# Patient Record
Sex: Female | Born: 1970 | Race: Black or African American | Hispanic: No | Smoking: Never smoker
Health system: Southern US, Community
[De-identification: ages and names within clinical notes are randomized; demographics above are authoritative.]

## PROBLEM LIST (undated history)

## (undated) DIAGNOSIS — M069 Rheumatoid arthritis, unspecified: Secondary | ICD-10-CM

## (undated) DIAGNOSIS — F329 Major depressive disorder, single episode, unspecified: Secondary | ICD-10-CM

## (undated) DIAGNOSIS — R42 Dizziness and giddiness: Secondary | ICD-10-CM

## (undated) DIAGNOSIS — E669 Obesity, unspecified: Secondary | ICD-10-CM

## (undated) DIAGNOSIS — F32A Depression, unspecified: Secondary | ICD-10-CM

## (undated) DIAGNOSIS — H8190 Unspecified disorder of vestibular function, unspecified ear: Secondary | ICD-10-CM

## (undated) DIAGNOSIS — E785 Hyperlipidemia, unspecified: Secondary | ICD-10-CM

## (undated) DIAGNOSIS — G629 Polyneuropathy, unspecified: Secondary | ICD-10-CM

## (undated) DIAGNOSIS — J310 Chronic rhinitis: Secondary | ICD-10-CM

## (undated) DIAGNOSIS — M797 Fibromyalgia: Secondary | ICD-10-CM

## (undated) DIAGNOSIS — Z8781 Personal history of (healed) traumatic fracture: Secondary | ICD-10-CM

## (undated) HISTORY — DX: Chronic rhinitis: J31.0

## (undated) HISTORY — DX: Major depressive disorder, single episode, unspecified: F32.9

## (undated) HISTORY — DX: Personal history of (healed) traumatic fracture: Z87.81

## (undated) HISTORY — DX: Unspecified disorder of vestibular function, unspecified ear: H81.90

## (undated) HISTORY — DX: Polyneuropathy, unspecified: G62.9

## (undated) HISTORY — DX: Dizziness and giddiness: R42

## (undated) HISTORY — DX: Fibromyalgia: M79.7

## (undated) HISTORY — DX: Depression, unspecified: F32.A

## (undated) HISTORY — DX: Obesity, unspecified: E66.9

## (undated) HISTORY — DX: Hyperlipidemia, unspecified: E78.5

## (undated) HISTORY — DX: Rheumatoid arthritis, unspecified: M06.9

---

## 1997-10-26 ENCOUNTER — Emergency Department (HOSPITAL_COMMUNITY): Admission: EM | Admit: 1997-10-26 | Discharge: 1997-10-26 | Payer: Self-pay | Admitting: Internal Medicine

## 1997-12-04 ENCOUNTER — Emergency Department (HOSPITAL_COMMUNITY): Admission: EM | Admit: 1997-12-04 | Discharge: 1997-12-04 | Payer: Self-pay | Admitting: Emergency Medicine

## 1998-01-23 ENCOUNTER — Emergency Department (HOSPITAL_COMMUNITY): Admission: EM | Admit: 1998-01-23 | Discharge: 1998-01-24 | Payer: Self-pay | Admitting: Emergency Medicine

## 1998-02-04 ENCOUNTER — Emergency Department (HOSPITAL_COMMUNITY): Admission: EM | Admit: 1998-02-04 | Discharge: 1998-02-04 | Payer: Self-pay | Admitting: Emergency Medicine

## 1998-03-01 ENCOUNTER — Emergency Department (HOSPITAL_COMMUNITY): Admission: EM | Admit: 1998-03-01 | Discharge: 1998-03-02 | Payer: Self-pay | Admitting: Emergency Medicine

## 1998-03-02 ENCOUNTER — Encounter: Payer: Self-pay | Admitting: Emergency Medicine

## 1998-03-06 ENCOUNTER — Inpatient Hospital Stay (HOSPITAL_COMMUNITY): Admission: AD | Admit: 1998-03-06 | Discharge: 1998-03-06 | Payer: Self-pay | Admitting: Obstetrics & Gynecology

## 1998-04-17 ENCOUNTER — Inpatient Hospital Stay (HOSPITAL_COMMUNITY): Admission: AD | Admit: 1998-04-17 | Discharge: 1998-04-17 | Payer: Self-pay | Admitting: *Deleted

## 1998-04-17 ENCOUNTER — Encounter: Payer: Self-pay | Admitting: *Deleted

## 1998-05-22 ENCOUNTER — Inpatient Hospital Stay (HOSPITAL_COMMUNITY): Admission: AD | Admit: 1998-05-22 | Discharge: 1998-05-22 | Payer: Self-pay | Admitting: Obstetrics & Gynecology

## 1998-07-06 ENCOUNTER — Inpatient Hospital Stay (HOSPITAL_COMMUNITY): Admission: AD | Admit: 1998-07-06 | Discharge: 1998-07-06 | Payer: Self-pay | Admitting: *Deleted

## 1998-07-28 ENCOUNTER — Ambulatory Visit (HOSPITAL_COMMUNITY): Admission: RE | Admit: 1998-07-28 | Discharge: 1998-07-28 | Payer: Self-pay | Admitting: Obstetrics

## 1998-08-17 ENCOUNTER — Inpatient Hospital Stay (HOSPITAL_COMMUNITY): Admission: RE | Admit: 1998-08-17 | Discharge: 1998-08-17 | Payer: Self-pay | Admitting: *Deleted

## 1998-09-16 ENCOUNTER — Encounter: Payer: Self-pay | Admitting: Obstetrics & Gynecology

## 1998-09-16 ENCOUNTER — Inpatient Hospital Stay (HOSPITAL_COMMUNITY): Admission: AD | Admit: 1998-09-16 | Discharge: 1998-09-16 | Payer: Self-pay | Admitting: Obstetrics & Gynecology

## 1998-10-11 ENCOUNTER — Inpatient Hospital Stay (HOSPITAL_COMMUNITY): Admission: AD | Admit: 1998-10-11 | Discharge: 1998-10-14 | Payer: Self-pay | Admitting: *Deleted

## 1998-12-19 ENCOUNTER — Inpatient Hospital Stay (HOSPITAL_COMMUNITY): Admission: AD | Admit: 1998-12-19 | Discharge: 1998-12-19 | Payer: Self-pay | Admitting: Obstetrics & Gynecology

## 1998-12-24 ENCOUNTER — Other Ambulatory Visit: Admission: RE | Admit: 1998-12-24 | Discharge: 1998-12-24 | Payer: Self-pay | Admitting: *Deleted

## 1998-12-24 ENCOUNTER — Encounter: Admission: RE | Admit: 1998-12-24 | Discharge: 1998-12-24 | Payer: Self-pay | Admitting: Obstetrics & Gynecology

## 1999-01-24 ENCOUNTER — Ambulatory Visit (HOSPITAL_COMMUNITY): Admission: RE | Admit: 1999-01-24 | Discharge: 1999-01-24 | Payer: Self-pay | Admitting: Obstetrics & Gynecology

## 1999-05-06 ENCOUNTER — Emergency Department (HOSPITAL_COMMUNITY): Admission: EM | Admit: 1999-05-06 | Discharge: 1999-05-06 | Payer: Self-pay

## 1999-08-02 ENCOUNTER — Emergency Department (HOSPITAL_COMMUNITY): Admission: EM | Admit: 1999-08-02 | Discharge: 1999-08-03 | Payer: Self-pay | Admitting: Emergency Medicine

## 1999-09-13 ENCOUNTER — Emergency Department (HOSPITAL_COMMUNITY): Admission: EM | Admit: 1999-09-13 | Discharge: 1999-09-13 | Payer: Self-pay | Admitting: Emergency Medicine

## 1999-09-19 ENCOUNTER — Encounter: Admission: RE | Admit: 1999-09-19 | Discharge: 1999-09-19 | Payer: Self-pay | Admitting: Internal Medicine

## 1999-09-23 ENCOUNTER — Emergency Department (HOSPITAL_COMMUNITY): Admission: EM | Admit: 1999-09-23 | Discharge: 1999-09-23 | Payer: Self-pay | Admitting: Emergency Medicine

## 1999-12-13 ENCOUNTER — Encounter: Admission: RE | Admit: 1999-12-13 | Discharge: 1999-12-13 | Payer: Self-pay | Admitting: Hematology and Oncology

## 2000-02-16 ENCOUNTER — Emergency Department (HOSPITAL_COMMUNITY): Admission: EM | Admit: 2000-02-16 | Discharge: 2000-02-16 | Payer: Self-pay | Admitting: Internal Medicine

## 2000-02-17 ENCOUNTER — Encounter: Payer: Self-pay | Admitting: Internal Medicine

## 2000-04-27 ENCOUNTER — Encounter: Admission: RE | Admit: 2000-04-27 | Discharge: 2000-04-27 | Payer: Self-pay | Admitting: Internal Medicine

## 2000-05-11 ENCOUNTER — Encounter: Admission: RE | Admit: 2000-05-11 | Discharge: 2000-05-11 | Payer: Self-pay | Admitting: Internal Medicine

## 2000-05-23 ENCOUNTER — Encounter: Admission: RE | Admit: 2000-05-23 | Discharge: 2000-05-23 | Payer: Self-pay | Admitting: Internal Medicine

## 2000-06-23 ENCOUNTER — Encounter: Payer: Self-pay | Admitting: Emergency Medicine

## 2000-06-23 ENCOUNTER — Emergency Department (HOSPITAL_COMMUNITY): Admission: EM | Admit: 2000-06-23 | Discharge: 2000-06-23 | Payer: Self-pay | Admitting: Emergency Medicine

## 2000-07-18 ENCOUNTER — Encounter: Payer: Self-pay | Admitting: Hematology and Oncology

## 2000-07-18 ENCOUNTER — Ambulatory Visit (HOSPITAL_COMMUNITY): Admission: RE | Admit: 2000-07-18 | Discharge: 2000-07-18 | Payer: Self-pay | Admitting: Hematology and Oncology

## 2000-07-18 ENCOUNTER — Encounter: Admission: RE | Admit: 2000-07-18 | Discharge: 2000-07-18 | Payer: Self-pay | Admitting: Hematology and Oncology

## 2000-09-18 ENCOUNTER — Encounter: Admission: RE | Admit: 2000-09-18 | Discharge: 2000-09-18 | Payer: Self-pay | Admitting: Internal Medicine

## 2000-11-16 ENCOUNTER — Other Ambulatory Visit: Admission: RE | Admit: 2000-11-16 | Discharge: 2000-11-16 | Payer: Self-pay | Admitting: Internal Medicine

## 2000-11-16 ENCOUNTER — Encounter: Admission: RE | Admit: 2000-11-16 | Discharge: 2000-11-16 | Payer: Self-pay | Admitting: Internal Medicine

## 2000-11-21 ENCOUNTER — Encounter: Payer: Self-pay | Admitting: Internal Medicine

## 2000-11-21 ENCOUNTER — Ambulatory Visit (HOSPITAL_COMMUNITY): Admission: RE | Admit: 2000-11-21 | Discharge: 2000-11-21 | Payer: Self-pay | Admitting: Internal Medicine

## 2001-01-17 ENCOUNTER — Encounter: Admission: RE | Admit: 2001-01-17 | Discharge: 2001-01-17 | Payer: Self-pay | Admitting: Internal Medicine

## 2001-01-22 ENCOUNTER — Encounter: Admission: RE | Admit: 2001-01-22 | Discharge: 2001-01-22 | Payer: Self-pay

## 2001-01-30 ENCOUNTER — Encounter: Admission: RE | Admit: 2001-01-30 | Discharge: 2001-01-30 | Payer: Self-pay | Admitting: Internal Medicine

## 2001-02-06 ENCOUNTER — Encounter: Admission: RE | Admit: 2001-02-06 | Discharge: 2001-02-06 | Payer: Self-pay | Admitting: Internal Medicine

## 2001-02-14 ENCOUNTER — Encounter: Admission: RE | Admit: 2001-02-14 | Discharge: 2001-02-14 | Payer: Self-pay | Admitting: Internal Medicine

## 2001-03-01 ENCOUNTER — Encounter: Admission: RE | Admit: 2001-03-01 | Discharge: 2001-03-01 | Payer: Self-pay

## 2001-03-05 ENCOUNTER — Encounter: Admission: RE | Admit: 2001-03-05 | Discharge: 2001-03-05 | Payer: Self-pay

## 2001-03-15 ENCOUNTER — Ambulatory Visit (HOSPITAL_COMMUNITY): Admission: RE | Admit: 2001-03-15 | Discharge: 2001-03-15 | Payer: Self-pay

## 2001-03-15 ENCOUNTER — Encounter: Admission: RE | Admit: 2001-03-15 | Discharge: 2001-03-15 | Payer: Self-pay | Admitting: *Deleted

## 2001-03-22 ENCOUNTER — Emergency Department (HOSPITAL_COMMUNITY): Admission: EM | Admit: 2001-03-22 | Discharge: 2001-03-22 | Payer: Self-pay | Admitting: Emergency Medicine

## 2001-03-22 ENCOUNTER — Encounter: Admission: RE | Admit: 2001-03-22 | Discharge: 2001-03-22 | Payer: Self-pay | Admitting: Internal Medicine

## 2001-03-22 ENCOUNTER — Encounter: Payer: Self-pay | Admitting: Emergency Medicine

## 2001-05-07 ENCOUNTER — Encounter: Admission: RE | Admit: 2001-05-07 | Discharge: 2001-05-07 | Payer: Self-pay | Admitting: Internal Medicine

## 2001-07-18 ENCOUNTER — Encounter: Admission: RE | Admit: 2001-07-18 | Discharge: 2001-07-18 | Payer: Self-pay | Admitting: Internal Medicine

## 2001-07-18 ENCOUNTER — Ambulatory Visit (HOSPITAL_COMMUNITY): Admission: RE | Admit: 2001-07-18 | Discharge: 2001-07-18 | Payer: Self-pay | Admitting: Internal Medicine

## 2001-07-18 ENCOUNTER — Encounter: Payer: Self-pay | Admitting: Internal Medicine

## 2001-08-29 ENCOUNTER — Encounter: Admission: RE | Admit: 2001-08-29 | Discharge: 2001-08-29 | Payer: Self-pay | Admitting: Internal Medicine

## 2001-09-25 ENCOUNTER — Ambulatory Visit (HOSPITAL_COMMUNITY): Admission: RE | Admit: 2001-09-25 | Discharge: 2001-09-25 | Payer: Self-pay | Admitting: Internal Medicine

## 2001-09-25 ENCOUNTER — Encounter: Payer: Self-pay | Admitting: Internal Medicine

## 2001-09-25 ENCOUNTER — Encounter: Admission: RE | Admit: 2001-09-25 | Discharge: 2001-09-25 | Payer: Self-pay | Admitting: Internal Medicine

## 2001-09-30 ENCOUNTER — Encounter: Admission: RE | Admit: 2001-09-30 | Discharge: 2001-09-30 | Payer: Self-pay | Admitting: Internal Medicine

## 2001-11-01 ENCOUNTER — Emergency Department (HOSPITAL_COMMUNITY): Admission: EM | Admit: 2001-11-01 | Discharge: 2001-11-02 | Payer: Self-pay | Admitting: Emergency Medicine

## 2001-11-09 ENCOUNTER — Emergency Department (HOSPITAL_COMMUNITY): Admission: EM | Admit: 2001-11-09 | Discharge: 2001-11-09 | Payer: Self-pay | Admitting: Emergency Medicine

## 2001-12-25 ENCOUNTER — Encounter: Admission: RE | Admit: 2001-12-25 | Discharge: 2001-12-25 | Payer: Self-pay | Admitting: Internal Medicine

## 2002-01-02 ENCOUNTER — Emergency Department (HOSPITAL_COMMUNITY): Admission: EM | Admit: 2002-01-02 | Discharge: 2002-01-02 | Payer: Self-pay | Admitting: Emergency Medicine

## 2002-04-08 ENCOUNTER — Encounter: Admission: RE | Admit: 2002-04-08 | Discharge: 2002-04-08 | Payer: Self-pay | Admitting: Internal Medicine

## 2002-04-11 ENCOUNTER — Ambulatory Visit (HOSPITAL_COMMUNITY): Admission: RE | Admit: 2002-04-11 | Discharge: 2002-04-11 | Payer: Self-pay | Admitting: Internal Medicine

## 2002-04-11 ENCOUNTER — Encounter: Payer: Self-pay | Admitting: Internal Medicine

## 2002-05-29 ENCOUNTER — Encounter: Admission: RE | Admit: 2002-05-29 | Discharge: 2002-05-29 | Payer: Self-pay | Admitting: Internal Medicine

## 2002-08-11 ENCOUNTER — Encounter: Admission: RE | Admit: 2002-08-11 | Discharge: 2002-08-11 | Payer: Self-pay | Admitting: Internal Medicine

## 2002-10-08 ENCOUNTER — Emergency Department (HOSPITAL_COMMUNITY): Admission: EM | Admit: 2002-10-08 | Discharge: 2002-10-08 | Payer: Self-pay | Admitting: Emergency Medicine

## 2002-11-06 ENCOUNTER — Encounter: Admission: RE | Admit: 2002-11-06 | Discharge: 2002-11-06 | Payer: Self-pay | Admitting: Internal Medicine

## 2002-11-07 ENCOUNTER — Encounter: Payer: Self-pay | Admitting: Internal Medicine

## 2002-11-07 ENCOUNTER — Encounter: Admission: RE | Admit: 2002-11-07 | Discharge: 2002-11-07 | Payer: Self-pay | Admitting: Internal Medicine

## 2002-11-12 ENCOUNTER — Encounter: Admission: RE | Admit: 2002-11-12 | Discharge: 2002-11-12 | Payer: Self-pay | Admitting: Internal Medicine

## 2002-11-19 ENCOUNTER — Encounter: Admission: RE | Admit: 2002-11-19 | Discharge: 2002-11-19 | Payer: Self-pay | Admitting: Internal Medicine

## 2002-11-26 ENCOUNTER — Encounter: Admission: RE | Admit: 2002-11-26 | Discharge: 2002-11-26 | Payer: Self-pay | Admitting: Internal Medicine

## 2002-12-08 ENCOUNTER — Ambulatory Visit (HOSPITAL_COMMUNITY): Admission: RE | Admit: 2002-12-08 | Discharge: 2002-12-08 | Payer: Self-pay | Admitting: Internal Medicine

## 2002-12-22 ENCOUNTER — Encounter: Admission: RE | Admit: 2002-12-22 | Discharge: 2002-12-22 | Payer: Self-pay | Admitting: Internal Medicine

## 2003-01-31 ENCOUNTER — Emergency Department (HOSPITAL_COMMUNITY): Admission: EM | Admit: 2003-01-31 | Discharge: 2003-01-31 | Payer: Self-pay | Admitting: *Deleted

## 2003-02-02 ENCOUNTER — Emergency Department (HOSPITAL_COMMUNITY): Admission: AD | Admit: 2003-02-02 | Discharge: 2003-02-02 | Payer: Self-pay | Admitting: Family Medicine

## 2003-02-09 ENCOUNTER — Encounter (INDEPENDENT_AMBULATORY_CARE_PROVIDER_SITE_OTHER): Payer: Self-pay | Admitting: Internal Medicine

## 2003-02-09 ENCOUNTER — Other Ambulatory Visit: Admission: RE | Admit: 2003-02-09 | Discharge: 2003-02-09 | Payer: Self-pay | Admitting: Internal Medicine

## 2003-02-09 ENCOUNTER — Encounter: Admission: RE | Admit: 2003-02-09 | Discharge: 2003-02-09 | Payer: Self-pay | Admitting: Internal Medicine

## 2003-03-23 ENCOUNTER — Encounter: Payer: Self-pay | Admitting: Internal Medicine

## 2003-03-23 ENCOUNTER — Encounter: Admission: RE | Admit: 2003-03-23 | Discharge: 2003-03-23 | Payer: Self-pay | Admitting: Internal Medicine

## 2003-04-03 ENCOUNTER — Emergency Department (HOSPITAL_COMMUNITY): Admission: AD | Admit: 2003-04-03 | Discharge: 2003-04-03 | Payer: Self-pay | Admitting: Family Medicine

## 2003-05-12 ENCOUNTER — Encounter: Admission: RE | Admit: 2003-05-12 | Discharge: 2003-05-12 | Payer: Self-pay | Admitting: Internal Medicine

## 2003-05-19 ENCOUNTER — Encounter: Admission: RE | Admit: 2003-05-19 | Discharge: 2003-05-19 | Payer: Self-pay | Admitting: Internal Medicine

## 2003-06-17 ENCOUNTER — Emergency Department (HOSPITAL_COMMUNITY): Admission: EM | Admit: 2003-06-17 | Discharge: 2003-06-17 | Payer: Self-pay | Admitting: Family Medicine

## 2003-07-17 ENCOUNTER — Encounter: Admission: RE | Admit: 2003-07-17 | Discharge: 2003-07-17 | Payer: Self-pay | Admitting: Internal Medicine

## 2003-07-20 ENCOUNTER — Emergency Department (HOSPITAL_COMMUNITY): Admission: EM | Admit: 2003-07-20 | Discharge: 2003-07-20 | Payer: Self-pay | Admitting: Emergency Medicine

## 2003-08-21 ENCOUNTER — Encounter: Admission: RE | Admit: 2003-08-21 | Discharge: 2003-08-21 | Payer: Self-pay | Admitting: Internal Medicine

## 2003-10-01 ENCOUNTER — Emergency Department (HOSPITAL_COMMUNITY): Admission: EM | Admit: 2003-10-01 | Discharge: 2003-10-01 | Payer: Self-pay | Admitting: Family Medicine

## 2003-11-28 ENCOUNTER — Emergency Department (HOSPITAL_COMMUNITY): Admission: EM | Admit: 2003-11-28 | Discharge: 2003-11-28 | Payer: Self-pay | Admitting: Emergency Medicine

## 2003-12-25 ENCOUNTER — Ambulatory Visit: Payer: Self-pay | Admitting: Internal Medicine

## 2004-02-16 ENCOUNTER — Emergency Department (HOSPITAL_COMMUNITY): Admission: EM | Admit: 2004-02-16 | Discharge: 2004-02-16 | Payer: Self-pay | Admitting: Family Medicine

## 2004-02-17 ENCOUNTER — Emergency Department (HOSPITAL_COMMUNITY): Admission: EM | Admit: 2004-02-17 | Discharge: 2004-02-17 | Payer: Self-pay | Admitting: Family Medicine

## 2004-03-11 ENCOUNTER — Ambulatory Visit: Payer: Self-pay | Admitting: Internal Medicine

## 2004-03-11 ENCOUNTER — Other Ambulatory Visit: Admission: RE | Admit: 2004-03-11 | Discharge: 2004-03-11 | Payer: Self-pay | Admitting: *Deleted

## 2004-05-28 ENCOUNTER — Emergency Department (HOSPITAL_COMMUNITY): Admission: EM | Admit: 2004-05-28 | Discharge: 2004-05-28 | Payer: Self-pay | Admitting: Emergency Medicine

## 2004-07-08 ENCOUNTER — Ambulatory Visit: Payer: Self-pay | Admitting: Internal Medicine

## 2004-08-08 ENCOUNTER — Ambulatory Visit: Payer: Self-pay | Admitting: Internal Medicine

## 2004-09-02 ENCOUNTER — Emergency Department (HOSPITAL_COMMUNITY): Admission: EM | Admit: 2004-09-02 | Discharge: 2004-09-02 | Payer: Self-pay | Admitting: Emergency Medicine

## 2004-10-04 ENCOUNTER — Emergency Department (HOSPITAL_COMMUNITY): Admission: EM | Admit: 2004-10-04 | Discharge: 2004-10-04 | Payer: Self-pay | Admitting: Family Medicine

## 2004-11-17 ENCOUNTER — Ambulatory Visit: Payer: Self-pay | Admitting: Internal Medicine

## 2004-12-29 ENCOUNTER — Emergency Department (HOSPITAL_COMMUNITY): Admission: EM | Admit: 2004-12-29 | Discharge: 2004-12-29 | Payer: Self-pay | Admitting: Emergency Medicine

## 2005-01-11 ENCOUNTER — Emergency Department (HOSPITAL_COMMUNITY): Admission: EM | Admit: 2005-01-11 | Discharge: 2005-01-11 | Payer: Self-pay | Admitting: Family Medicine

## 2005-01-30 ENCOUNTER — Emergency Department (HOSPITAL_COMMUNITY): Admission: EM | Admit: 2005-01-30 | Discharge: 2005-01-30 | Payer: Self-pay | Admitting: Family Medicine

## 2005-03-04 ENCOUNTER — Emergency Department (HOSPITAL_COMMUNITY): Admission: EM | Admit: 2005-03-04 | Discharge: 2005-03-04 | Payer: Self-pay | Admitting: Emergency Medicine

## 2005-03-17 ENCOUNTER — Ambulatory Visit (HOSPITAL_COMMUNITY): Admission: RE | Admit: 2005-03-17 | Discharge: 2005-03-17 | Payer: Self-pay | Admitting: Hospitalist

## 2005-03-17 ENCOUNTER — Ambulatory Visit: Payer: Self-pay | Admitting: Hospitalist

## 2005-05-04 ENCOUNTER — Emergency Department (HOSPITAL_COMMUNITY): Admission: EM | Admit: 2005-05-04 | Discharge: 2005-05-04 | Payer: Self-pay | Admitting: Family Medicine

## 2005-06-14 ENCOUNTER — Ambulatory Visit: Payer: Self-pay | Admitting: Internal Medicine

## 2005-06-17 ENCOUNTER — Ambulatory Visit (HOSPITAL_COMMUNITY): Admission: RE | Admit: 2005-06-17 | Discharge: 2005-06-17 | Payer: Self-pay | Admitting: *Deleted

## 2005-06-29 ENCOUNTER — Ambulatory Visit: Payer: Self-pay | Admitting: Internal Medicine

## 2005-07-12 ENCOUNTER — Encounter: Admission: RE | Admit: 2005-07-12 | Discharge: 2005-08-08 | Payer: Self-pay | Admitting: *Deleted

## 2005-08-16 ENCOUNTER — Ambulatory Visit: Payer: Self-pay | Admitting: Internal Medicine

## 2005-08-22 ENCOUNTER — Ambulatory Visit (HOSPITAL_COMMUNITY): Admission: RE | Admit: 2005-08-22 | Discharge: 2005-08-22 | Payer: Self-pay | Admitting: Internal Medicine

## 2005-09-04 ENCOUNTER — Emergency Department (HOSPITAL_COMMUNITY): Admission: EM | Admit: 2005-09-04 | Discharge: 2005-09-04 | Payer: Self-pay | Admitting: Family Medicine

## 2005-10-04 ENCOUNTER — Emergency Department (HOSPITAL_COMMUNITY): Admission: EM | Admit: 2005-10-04 | Discharge: 2005-10-04 | Payer: Self-pay | Admitting: Family Medicine

## 2005-10-24 ENCOUNTER — Emergency Department (HOSPITAL_COMMUNITY): Admission: EM | Admit: 2005-10-24 | Discharge: 2005-10-24 | Payer: Self-pay | Admitting: Emergency Medicine

## 2005-10-30 ENCOUNTER — Ambulatory Visit: Payer: Self-pay | Admitting: Hospitalist

## 2005-11-07 ENCOUNTER — Ambulatory Visit: Payer: Self-pay | Admitting: Hospitalist

## 2005-11-13 ENCOUNTER — Ambulatory Visit: Payer: Self-pay | Admitting: Internal Medicine

## 2005-11-14 ENCOUNTER — Ambulatory Visit (HOSPITAL_COMMUNITY): Admission: RE | Admit: 2005-11-14 | Discharge: 2005-11-14 | Payer: Self-pay | Admitting: *Deleted

## 2005-11-24 ENCOUNTER — Emergency Department (HOSPITAL_COMMUNITY): Admission: EM | Admit: 2005-11-24 | Discharge: 2005-11-24 | Payer: Self-pay | Admitting: Emergency Medicine

## 2005-12-01 ENCOUNTER — Ambulatory Visit (HOSPITAL_COMMUNITY): Admission: RE | Admit: 2005-12-01 | Discharge: 2005-12-01 | Payer: Self-pay | Admitting: *Deleted

## 2006-01-08 DIAGNOSIS — J45909 Unspecified asthma, uncomplicated: Secondary | ICD-10-CM | POA: Insufficient documentation

## 2006-01-08 DIAGNOSIS — H9319 Tinnitus, unspecified ear: Secondary | ICD-10-CM | POA: Insufficient documentation

## 2006-01-22 ENCOUNTER — Encounter (INDEPENDENT_AMBULATORY_CARE_PROVIDER_SITE_OTHER): Payer: Self-pay | Admitting: *Deleted

## 2006-01-22 ENCOUNTER — Ambulatory Visit: Payer: Self-pay | Admitting: Hospitalist

## 2006-01-22 LAB — CONVERTED CEMR LAB
AST: 22 units/L (ref 0–37)
BUN: 9 mg/dL (ref 6–23)
Bilirubin Urine: NEGATIVE
Calcium: 9.7 mg/dL (ref 8.4–10.5)
Chloride: 103 meq/L (ref 96–112)
Creatinine, Ser: 0.86 mg/dL (ref 0.40–1.20)
Nitrite: NEGATIVE
RBC / HPF: NONE SEEN (ref <3)
Specific Gravity, Urine: 1.025 (ref 1.005–1.03)
Total Bilirubin: 0.5 mg/dL (ref 0.3–1.2)
Urobilinogen, UA: 0.2 (ref 0.0–1.0)
pH: 6 (ref 5.0–8.0)

## 2006-02-19 ENCOUNTER — Ambulatory Visit: Payer: Self-pay | Admitting: *Deleted

## 2006-02-19 ENCOUNTER — Encounter (INDEPENDENT_AMBULATORY_CARE_PROVIDER_SITE_OTHER): Payer: Self-pay | Admitting: *Deleted

## 2006-02-19 LAB — CONVERTED CEMR LAB
Alkaline Phosphatase: 89 units/L (ref 39–117)
BUN: 12 mg/dL (ref 6–23)
Bilirubin Urine: NEGATIVE
Glucose, Bld: 124 mg/dL — ABNORMAL HIGH (ref 70–99)
RBC / HPF: NONE SEEN (ref <3)
Sodium: 136 meq/L (ref 135–145)
Specific Gravity, Urine: 1.026 (ref 1.005–1.03)
Total Bilirubin: 0.3 mg/dL (ref 0.3–1.2)
Urine Glucose: 250 mg/dL — AB
WBC, UA: NONE SEEN cells/hpf (ref <3)

## 2006-04-02 ENCOUNTER — Telehealth (INDEPENDENT_AMBULATORY_CARE_PROVIDER_SITE_OTHER): Payer: Self-pay | Admitting: *Deleted

## 2006-04-10 ENCOUNTER — Encounter (INDEPENDENT_AMBULATORY_CARE_PROVIDER_SITE_OTHER): Payer: Self-pay | Admitting: *Deleted

## 2006-04-10 ENCOUNTER — Ambulatory Visit: Payer: Self-pay | Admitting: Internal Medicine

## 2006-04-10 DIAGNOSIS — F3289 Other specified depressive episodes: Secondary | ICD-10-CM | POA: Insufficient documentation

## 2006-04-10 DIAGNOSIS — F329 Major depressive disorder, single episode, unspecified: Secondary | ICD-10-CM | POA: Insufficient documentation

## 2006-04-10 LAB — CONVERTED CEMR LAB: TSH: 2.277 microintl units/mL (ref 0.350–5.50)

## 2006-04-12 ENCOUNTER — Ambulatory Visit (HOSPITAL_COMMUNITY): Admission: RE | Admit: 2006-04-12 | Discharge: 2006-04-12 | Payer: Self-pay | Admitting: *Deleted

## 2006-04-16 ENCOUNTER — Telehealth: Payer: Self-pay | Admitting: *Deleted

## 2006-04-17 ENCOUNTER — Encounter (INDEPENDENT_AMBULATORY_CARE_PROVIDER_SITE_OTHER): Payer: Self-pay | Admitting: *Deleted

## 2006-04-17 ENCOUNTER — Telehealth: Payer: Self-pay | Admitting: *Deleted

## 2006-05-07 ENCOUNTER — Encounter (INDEPENDENT_AMBULATORY_CARE_PROVIDER_SITE_OTHER): Payer: Self-pay | Admitting: *Deleted

## 2006-06-05 ENCOUNTER — Emergency Department (HOSPITAL_COMMUNITY): Admission: EM | Admit: 2006-06-05 | Discharge: 2006-06-05 | Payer: Self-pay | Admitting: Emergency Medicine

## 2006-07-17 ENCOUNTER — Ambulatory Visit: Payer: Self-pay | Admitting: Internal Medicine

## 2006-07-17 ENCOUNTER — Ambulatory Visit (HOSPITAL_COMMUNITY): Admission: RE | Admit: 2006-07-17 | Discharge: 2006-07-17 | Payer: Self-pay | Admitting: Internal Medicine

## 2006-08-03 ENCOUNTER — Telehealth: Payer: Self-pay | Admitting: *Deleted

## 2006-08-03 ENCOUNTER — Emergency Department (HOSPITAL_COMMUNITY): Admission: EM | Admit: 2006-08-03 | Discharge: 2006-08-03 | Payer: Self-pay | Admitting: Family Medicine

## 2006-08-23 ENCOUNTER — Ambulatory Visit: Payer: Self-pay | Admitting: Internal Medicine

## 2006-08-23 DIAGNOSIS — M25569 Pain in unspecified knee: Secondary | ICD-10-CM | POA: Insufficient documentation

## 2006-08-28 ENCOUNTER — Ambulatory Visit: Payer: Self-pay | Admitting: Internal Medicine

## 2006-08-28 ENCOUNTER — Encounter (INDEPENDENT_AMBULATORY_CARE_PROVIDER_SITE_OTHER): Payer: Self-pay | Admitting: Internal Medicine

## 2006-08-28 ENCOUNTER — Telehealth: Payer: Self-pay | Admitting: *Deleted

## 2006-08-28 LAB — CONVERTED CEMR LAB
Nitrite: NEGATIVE
Specific Gravity, Urine: >=1.03
pH: 6

## 2006-08-29 LAB — CONVERTED CEMR LAB
Bilirubin Urine: NEGATIVE
Hemoglobin, Urine: NEGATIVE
Protein, ur: 100 mg/dL — AB
Urine Glucose: NEGATIVE mg/dL

## 2006-09-17 ENCOUNTER — Encounter (INDEPENDENT_AMBULATORY_CARE_PROVIDER_SITE_OTHER): Payer: Self-pay | Admitting: Internal Medicine

## 2006-09-17 ENCOUNTER — Ambulatory Visit: Payer: Self-pay | Admitting: Hospitalist

## 2006-09-17 LAB — CONVERTED CEMR LAB
ALT: 25 units/L (ref 0–35)
AST: 20 units/L (ref 0–37)
Albumin: 4.2 g/dL (ref 3.5–5.2)
Alkaline Phosphatase: 97 units/L (ref 39–117)
Basophils Absolute: 0 10*3/uL (ref 0.0–0.1)
Basophils Relative: 1 % (ref 0–1)
Bilirubin Urine: NEGATIVE
Eosinophils Absolute: 0.4 10*3/uL (ref 0.0–0.7)
Ketones, ur: NEGATIVE mg/dL
Lymphs Abs: 2 10*3/uL (ref 0.7–3.3)
MCHC: 32 g/dL (ref 30.0–36.0)
MCV: 72.6 fL — ABNORMAL LOW (ref 78.0–100.0)
Microalb, Ur: 50.3 mg/dL — ABNORMAL HIGH (ref 0.00–1.89)
Neutrophils Relative %: 46 % (ref 43–77)
Platelets: 266 10*3/uL (ref 150–400)
Potassium: 4 meq/L (ref 3.5–5.3)
Protein, ur: 100 mg/dL — AB
RDW: 15.8 % — ABNORMAL HIGH (ref 11.5–14.0)
Sodium: 140 meq/L (ref 135–145)
Total Protein: 7.7 g/dL (ref 6.0–8.3)
Urine Glucose: NEGATIVE mg/dL
Urobilinogen, UA: 1 (ref 0.0–1.0)
WBC: 5.3 10*3/uL (ref 4.0–10.5)

## 2006-09-19 ENCOUNTER — Encounter (INDEPENDENT_AMBULATORY_CARE_PROVIDER_SITE_OTHER): Payer: Self-pay | Admitting: Internal Medicine

## 2006-10-31 ENCOUNTER — Inpatient Hospital Stay (HOSPITAL_COMMUNITY): Admission: AD | Admit: 2006-10-31 | Discharge: 2006-10-31 | Payer: Self-pay | Admitting: Gynecology

## 2006-10-31 ENCOUNTER — Encounter: Admission: RE | Admit: 2006-10-31 | Discharge: 2007-01-29 | Payer: Self-pay | Admitting: Orthopedic Surgery

## 2006-11-06 ENCOUNTER — Encounter: Admission: RE | Admit: 2006-11-06 | Discharge: 2006-11-06 | Payer: Self-pay | Admitting: Internal Medicine

## 2006-12-06 ENCOUNTER — Emergency Department (HOSPITAL_COMMUNITY): Admission: EM | Admit: 2006-12-06 | Discharge: 2006-12-06 | Payer: Self-pay | Admitting: Emergency Medicine

## 2007-01-07 ENCOUNTER — Encounter (INDEPENDENT_AMBULATORY_CARE_PROVIDER_SITE_OTHER): Payer: Self-pay | Admitting: *Deleted

## 2007-01-07 ENCOUNTER — Ambulatory Visit: Payer: Self-pay | Admitting: Hospitalist

## 2007-01-07 LAB — CONVERTED CEMR LAB
ALT: 19 units/L (ref 0–35)
AST: 17 units/L (ref 0–37)
Bilirubin Urine: NEGATIVE
CO2: 25 meq/L (ref 19–32)
Chloride: 103 meq/L (ref 96–112)
Creatinine, Ser: 0.83 mg/dL (ref 0.40–1.20)
Ketones, urine, test strip: NEGATIVE
Nitrite: NEGATIVE
Protein, U semiquant: 100
Sodium: 139 meq/L (ref 135–145)
Total Bilirubin: 0.4 mg/dL (ref 0.3–1.2)
Total Protein: 7.5 g/dL (ref 6.0–8.3)
WBC Urine, dipstick: NEGATIVE

## 2007-01-08 ENCOUNTER — Ambulatory Visit: Payer: Self-pay | Admitting: *Deleted

## 2007-01-08 LAB — CONVERTED CEMR LAB: Blood Glucose, AC Bkfst: 118 mg/dL

## 2007-01-14 ENCOUNTER — Ambulatory Visit: Payer: Self-pay | Admitting: Hospitalist

## 2007-01-14 LAB — CONVERTED CEMR LAB
Bilirubin Urine: NEGATIVE
Blood in Urine, dipstick: NEGATIVE
Protein, U semiquant: 100
Specific Gravity, Urine: >=1.03

## 2007-01-15 ENCOUNTER — Telehealth: Payer: Self-pay | Admitting: Licensed Clinical Social Worker

## 2007-01-21 ENCOUNTER — Telehealth: Payer: Self-pay | Admitting: *Deleted

## 2007-03-22 ENCOUNTER — Emergency Department (HOSPITAL_COMMUNITY): Admission: EM | Admit: 2007-03-22 | Discharge: 2007-03-22 | Payer: Self-pay | Admitting: Family Medicine

## 2007-04-03 ENCOUNTER — Ambulatory Visit: Payer: Self-pay | Admitting: Infectious Disease

## 2007-04-03 ENCOUNTER — Encounter (INDEPENDENT_AMBULATORY_CARE_PROVIDER_SITE_OTHER): Payer: Self-pay | Admitting: Internal Medicine

## 2007-04-08 ENCOUNTER — Ambulatory Visit (HOSPITAL_COMMUNITY): Admission: RE | Admit: 2007-04-08 | Discharge: 2007-04-08 | Payer: Self-pay | Admitting: Internal Medicine

## 2007-04-08 LAB — CONVERTED CEMR LAB
Anti Nuclear Antibody(ANA): NEGATIVE
Basophils Absolute: 0 10*3/uL (ref 0.0–0.1)
Basophils Relative: 0 % (ref 0–1)
Bilirubin Urine: NEGATIVE
Eosinophils Absolute: 0.3 10*3/uL (ref 0.0–0.7)
Eosinophils Relative: 6 % — ABNORMAL HIGH (ref 0–5)
HCT: 42.5 % (ref 36.0–46.0)
Hemoglobin, Urine: NEGATIVE
Hemoglobin: 13.1 g/dL (ref 12.0–15.0)
Ketones, ur: NEGATIVE mg/dL
MCHC: 30.8 g/dL (ref 30.0–36.0)
MCV: 73.3 fL — ABNORMAL LOW (ref 78.0–100.0)
Monocytes Absolute: 0.5 10*3/uL (ref 0.1–1.0)
Nitrite: NEGATIVE
Platelets: 203 10*3/uL (ref 150–400)
RDW: 15.7 % — ABNORMAL HIGH (ref 11.5–15.5)
Specific Gravity, Urine: 1.019 (ref 1.005–1.03)
Urobilinogen, UA: 1 (ref 0.0–1.0)
WBC, UA: NONE SEEN cells/hpf (ref <3)

## 2007-04-10 ENCOUNTER — Encounter: Admission: RE | Admit: 2007-04-10 | Discharge: 2007-05-23 | Payer: Self-pay | Admitting: Orthopedic Surgery

## 2007-04-12 ENCOUNTER — Telehealth (INDEPENDENT_AMBULATORY_CARE_PROVIDER_SITE_OTHER): Payer: Self-pay | Admitting: Internal Medicine

## 2007-04-17 ENCOUNTER — Ambulatory Visit: Payer: Self-pay | Admitting: Internal Medicine

## 2007-07-17 ENCOUNTER — Ambulatory Visit: Payer: Self-pay | Admitting: Gynecology

## 2007-07-19 ENCOUNTER — Ambulatory Visit (HOSPITAL_COMMUNITY): Admission: RE | Admit: 2007-07-19 | Discharge: 2007-07-19 | Payer: Self-pay | Admitting: Family Medicine

## 2007-07-23 ENCOUNTER — Emergency Department (HOSPITAL_COMMUNITY): Admission: EM | Admit: 2007-07-23 | Discharge: 2007-07-23 | Payer: Self-pay | Admitting: Emergency Medicine

## 2007-09-16 ENCOUNTER — Telehealth: Payer: Self-pay | Admitting: Internal Medicine

## 2007-09-17 ENCOUNTER — Encounter: Payer: Self-pay | Admitting: Internal Medicine

## 2007-09-24 ENCOUNTER — Encounter: Payer: Self-pay | Admitting: Internal Medicine

## 2007-10-03 ENCOUNTER — Encounter: Payer: Self-pay | Admitting: Internal Medicine

## 2007-10-03 ENCOUNTER — Ambulatory Visit: Payer: Self-pay | Admitting: Internal Medicine

## 2007-10-07 LAB — CONVERTED CEMR LAB
ALT: 29 units/L (ref 0–35)
AST: 22 units/L (ref 0–37)
Albumin: 3.8 g/dL (ref 3.5–5.2)
Basophils Absolute: 0 10*3/uL (ref 0.0–0.1)
Basophils Relative: 0 % (ref 0–1)
CO2: 26 meq/L (ref 19–32)
Calcium: 8.9 mg/dL (ref 8.4–10.5)
Chloride: 102 meq/L (ref 96–112)
Creatinine, Urine: 258.9 mg/dL
Hemoglobin: 13.2 g/dL (ref 12.0–15.0)
Lipase: 15 units/L (ref 11–59)
Lymphocytes Relative: 23 % (ref 12–46)
MCHC: 31.4 g/dL (ref 30.0–36.0)
Microalb Creat Ratio: 321.8 mg/g — ABNORMAL HIGH (ref 0.0–30.0)
Microalb, Ur: 83.3 mg/dL — ABNORMAL HIGH (ref 0.00–1.89)
Monocytes Relative: 9 % (ref 3–12)
Neutro Abs: 2.6 10*3/uL (ref 1.7–7.7)
Neutrophils Relative %: 55 % (ref 43–77)
Potassium: 3.5 meq/L (ref 3.5–5.3)
RBC: 5.55 M/uL — ABNORMAL HIGH (ref 3.87–5.11)
RDW: 15.4 % (ref 11.5–15.5)

## 2007-10-10 ENCOUNTER — Encounter: Payer: Self-pay | Admitting: Internal Medicine

## 2007-10-15 ENCOUNTER — Emergency Department (HOSPITAL_COMMUNITY): Admission: EM | Admit: 2007-10-15 | Discharge: 2007-10-15 | Payer: Self-pay | Admitting: Family Medicine

## 2007-10-18 ENCOUNTER — Ambulatory Visit (HOSPITAL_COMMUNITY): Admission: RE | Admit: 2007-10-18 | Discharge: 2007-10-18 | Payer: Self-pay | Admitting: Emergency Medicine

## 2007-11-06 ENCOUNTER — Emergency Department (HOSPITAL_COMMUNITY): Admission: EM | Admit: 2007-11-06 | Discharge: 2007-11-06 | Payer: Self-pay | Admitting: Family Medicine

## 2007-12-09 ENCOUNTER — Emergency Department (HOSPITAL_COMMUNITY): Admission: EM | Admit: 2007-12-09 | Discharge: 2007-12-09 | Payer: Self-pay | Admitting: Family Medicine

## 2008-01-01 ENCOUNTER — Encounter: Payer: Self-pay | Admitting: Internal Medicine

## 2008-01-01 ENCOUNTER — Encounter: Admission: RE | Admit: 2008-01-01 | Discharge: 2008-01-01 | Payer: Self-pay | Admitting: Internal Medicine

## 2008-01-08 ENCOUNTER — Ambulatory Visit: Payer: Self-pay | Admitting: Infectious Diseases

## 2008-01-08 ENCOUNTER — Encounter (INDEPENDENT_AMBULATORY_CARE_PROVIDER_SITE_OTHER): Payer: Self-pay | Admitting: Internal Medicine

## 2008-01-08 ENCOUNTER — Ambulatory Visit (HOSPITAL_COMMUNITY): Admission: RE | Admit: 2008-01-08 | Discharge: 2008-01-08 | Payer: Self-pay | Admitting: Infectious Diseases

## 2008-01-08 DIAGNOSIS — IMO0002 Reserved for concepts with insufficient information to code with codable children: Secondary | ICD-10-CM

## 2008-01-08 DIAGNOSIS — E1165 Type 2 diabetes mellitus with hyperglycemia: Secondary | ICD-10-CM | POA: Insufficient documentation

## 2008-01-08 DIAGNOSIS — E118 Type 2 diabetes mellitus with unspecified complications: Secondary | ICD-10-CM

## 2008-01-08 DIAGNOSIS — J310 Chronic rhinitis: Secondary | ICD-10-CM | POA: Insufficient documentation

## 2008-01-08 HISTORY — DX: Reserved for concepts with insufficient information to code with codable children: IMO0002

## 2008-01-13 ENCOUNTER — Ambulatory Visit: Payer: Self-pay | Admitting: *Deleted

## 2008-01-28 ENCOUNTER — Encounter: Payer: Self-pay | Admitting: Internal Medicine

## 2008-02-05 ENCOUNTER — Ambulatory Visit: Payer: Self-pay | Admitting: Internal Medicine

## 2008-02-05 DIAGNOSIS — M79609 Pain in unspecified limb: Secondary | ICD-10-CM | POA: Insufficient documentation

## 2008-02-05 DIAGNOSIS — H9190 Unspecified hearing loss, unspecified ear: Secondary | ICD-10-CM | POA: Insufficient documentation

## 2008-02-06 ENCOUNTER — Telehealth (INDEPENDENT_AMBULATORY_CARE_PROVIDER_SITE_OTHER): Payer: Self-pay | Admitting: *Deleted

## 2008-02-19 ENCOUNTER — Encounter (INDEPENDENT_AMBULATORY_CARE_PROVIDER_SITE_OTHER): Payer: Self-pay | Admitting: Internal Medicine

## 2008-02-19 ENCOUNTER — Ambulatory Visit: Payer: Self-pay | Admitting: Internal Medicine

## 2008-02-19 LAB — CONVERTED CEMR LAB
BUN: 8 mg/dL (ref 6–23)
CO2: 20 meq/L (ref 19–32)
Chloride: 104 meq/L (ref 96–112)
Glucose, Bld: 126 mg/dL — ABNORMAL HIGH (ref 70–99)
Potassium: 4.1 meq/L (ref 3.5–5.3)

## 2008-02-20 ENCOUNTER — Encounter (INDEPENDENT_AMBULATORY_CARE_PROVIDER_SITE_OTHER): Payer: Self-pay | Admitting: Internal Medicine

## 2008-02-29 ENCOUNTER — Emergency Department (HOSPITAL_COMMUNITY): Admission: EM | Admit: 2008-02-29 | Discharge: 2008-02-29 | Payer: Self-pay | Admitting: Family Medicine

## 2008-03-16 ENCOUNTER — Telehealth: Payer: Self-pay | Admitting: Internal Medicine

## 2008-03-17 ENCOUNTER — Telehealth: Payer: Self-pay | Admitting: Infectious Disease

## 2008-03-31 ENCOUNTER — Ambulatory Visit: Payer: Self-pay | Admitting: Internal Medicine

## 2008-03-31 ENCOUNTER — Encounter: Admission: RE | Admit: 2008-03-31 | Discharge: 2008-05-06 | Payer: Self-pay | Admitting: Orthopedic Surgery

## 2008-03-31 ENCOUNTER — Encounter (INDEPENDENT_AMBULATORY_CARE_PROVIDER_SITE_OTHER): Payer: Self-pay | Admitting: Internal Medicine

## 2008-03-31 ENCOUNTER — Ambulatory Visit: Payer: Self-pay | Admitting: Surgery

## 2008-03-31 ENCOUNTER — Ambulatory Visit (HOSPITAL_COMMUNITY): Admission: RE | Admit: 2008-03-31 | Discharge: 2008-03-31 | Payer: Self-pay | Admitting: Internal Medicine

## 2008-03-31 LAB — CONVERTED CEMR LAB: Blood Glucose, Fingerstick: 177

## 2008-04-01 ENCOUNTER — Ambulatory Visit: Payer: Self-pay | Admitting: Internal Medicine

## 2008-04-01 ENCOUNTER — Encounter: Payer: Self-pay | Admitting: Internal Medicine

## 2008-04-08 ENCOUNTER — Emergency Department (HOSPITAL_COMMUNITY): Admission: EM | Admit: 2008-04-08 | Discharge: 2008-04-08 | Payer: Self-pay | Admitting: *Deleted

## 2008-04-08 ENCOUNTER — Telehealth: Payer: Self-pay | Admitting: Internal Medicine

## 2008-04-08 DIAGNOSIS — E785 Hyperlipidemia, unspecified: Secondary | ICD-10-CM | POA: Insufficient documentation

## 2008-04-08 LAB — CONVERTED CEMR LAB
AST: 21 units/L (ref 0–37)
BUN: 12 mg/dL (ref 6–23)
Calcium: 9.2 mg/dL (ref 8.4–10.5)
Chloride: 104 meq/L (ref 96–112)
Creatinine, Ser: 0.83 mg/dL (ref 0.40–1.20)
HCT: 39.9 % (ref 36.0–46.0)
HDL: 37 mg/dL — ABNORMAL LOW (ref >39)
Hemoglobin: 12.6 g/dL (ref 12.0–15.0)
MCHC: 31.6 g/dL (ref 30.0–36.0)
MCV: 72.4 fL — ABNORMAL LOW (ref 78.0–100.0)
RDW: 15.6 % — ABNORMAL HIGH (ref 11.5–15.5)
Total Bilirubin: 0.3 mg/dL (ref 0.3–1.2)
Total CHOL/HDL Ratio: 4.3

## 2008-04-17 ENCOUNTER — Ambulatory Visit (HOSPITAL_COMMUNITY): Admission: RE | Admit: 2008-04-17 | Discharge: 2008-04-17 | Payer: Self-pay | Admitting: Internal Medicine

## 2008-04-17 ENCOUNTER — Ambulatory Visit: Payer: Self-pay | Admitting: Internal Medicine

## 2008-04-17 ENCOUNTER — Encounter: Payer: Self-pay | Admitting: Internal Medicine

## 2008-04-17 DIAGNOSIS — M069 Rheumatoid arthritis, unspecified: Secondary | ICD-10-CM | POA: Insufficient documentation

## 2008-04-17 LAB — CONVERTED CEMR LAB: Rhuematoid fact SerPl-aCnc: 42 intl units/mL — ABNORMAL HIGH (ref 0–20)

## 2008-04-26 ENCOUNTER — Emergency Department (HOSPITAL_COMMUNITY): Admission: EM | Admit: 2008-04-26 | Discharge: 2008-04-27 | Payer: Self-pay | Admitting: Emergency Medicine

## 2008-04-27 ENCOUNTER — Telehealth: Payer: Self-pay | Admitting: *Deleted

## 2008-04-29 ENCOUNTER — Ambulatory Visit (HOSPITAL_COMMUNITY): Admission: RE | Admit: 2008-04-29 | Discharge: 2008-04-29 | Payer: Self-pay | Admitting: Internal Medicine

## 2008-04-29 ENCOUNTER — Ambulatory Visit: Payer: Self-pay | Admitting: Internal Medicine

## 2008-04-29 ENCOUNTER — Encounter: Payer: Self-pay | Admitting: Internal Medicine

## 2008-05-08 ENCOUNTER — Ambulatory Visit: Payer: Self-pay | Admitting: Internal Medicine

## 2008-05-11 ENCOUNTER — Ambulatory Visit: Payer: Self-pay | Admitting: Internal Medicine

## 2008-05-11 ENCOUNTER — Encounter (INDEPENDENT_AMBULATORY_CARE_PROVIDER_SITE_OTHER): Payer: Self-pay | Admitting: *Deleted

## 2008-06-30 ENCOUNTER — Encounter: Payer: Self-pay | Admitting: Internal Medicine

## 2008-07-20 ENCOUNTER — Encounter: Payer: Self-pay | Admitting: Internal Medicine

## 2008-07-21 ENCOUNTER — Encounter: Payer: Self-pay | Admitting: Internal Medicine

## 2008-07-23 ENCOUNTER — Encounter (INDEPENDENT_AMBULATORY_CARE_PROVIDER_SITE_OTHER): Payer: Self-pay | Admitting: *Deleted

## 2008-07-24 ENCOUNTER — Encounter (INDEPENDENT_AMBULATORY_CARE_PROVIDER_SITE_OTHER): Payer: Self-pay | Admitting: *Deleted

## 2008-07-24 ENCOUNTER — Ambulatory Visit: Payer: Self-pay | Admitting: Infectious Disease

## 2008-07-24 DIAGNOSIS — E559 Vitamin D deficiency, unspecified: Secondary | ICD-10-CM | POA: Insufficient documentation

## 2008-07-24 LAB — CONVERTED CEMR LAB
Blood Glucose, Fingerstick: 166
Hgb A1c MFr Bld: 7.5 %

## 2008-07-28 ENCOUNTER — Encounter (INDEPENDENT_AMBULATORY_CARE_PROVIDER_SITE_OTHER): Payer: Self-pay | Admitting: *Deleted

## 2008-07-28 ENCOUNTER — Encounter: Payer: Self-pay | Admitting: Internal Medicine

## 2008-07-28 LAB — CONVERTED CEMR LAB
ALT: 29 units/L (ref 0–35)
AST: 24 units/L (ref 0–37)
Albumin: 4.2 g/dL (ref 3.5–5.2)
BUN: 10 mg/dL (ref 6–23)
Bacteria, UA: NONE SEEN
Basophils Relative: 1 % (ref 0–1)
Calcium: 9.6 mg/dL (ref 8.4–10.5)
Chloride: 99 meq/L (ref 96–112)
Eosinophils Absolute: 0.9 10*3/uL — ABNORMAL HIGH (ref 0.0–0.7)
HCT: 38.7 % (ref 36.0–46.0)
Hemoglobin, Urine: NEGATIVE
Hemoglobin: 12.9 g/dL (ref 12.0–15.0)
Ketones, ur: NEGATIVE mg/dL
Leukocytes, UA: NEGATIVE
MCHC: 33.3 g/dL (ref 30.0–36.0)
MCV: 71.1 fL — ABNORMAL LOW (ref 78.0–100.0)
Monocytes Absolute: 0.4 10*3/uL (ref 0.1–1.0)
Monocytes Relative: 7 % (ref 3–12)
Potassium: 4 meq/L (ref 3.5–5.3)
RBC / HPF: NONE SEEN (ref <3)
RBC: 5.44 M/uL — ABNORMAL HIGH (ref 3.87–5.11)
Urine Glucose: NEGATIVE mg/dL
WBC, UA: NONE SEEN cells/hpf (ref <3)
pH: 6 (ref 5.0–8.0)

## 2008-07-31 ENCOUNTER — Encounter (INDEPENDENT_AMBULATORY_CARE_PROVIDER_SITE_OTHER): Payer: Self-pay | Admitting: *Deleted

## 2008-07-31 LAB — CONVERTED CEMR LAB: Aldolase: 8.9 units/L — ABNORMAL HIGH (ref <=8.1)

## 2008-08-21 ENCOUNTER — Telehealth: Payer: Self-pay | Admitting: *Deleted

## 2008-08-21 ENCOUNTER — Ambulatory Visit: Payer: Self-pay | Admitting: Internal Medicine

## 2008-09-03 ENCOUNTER — Encounter: Payer: Self-pay | Admitting: Internal Medicine

## 2008-10-14 ENCOUNTER — Emergency Department (HOSPITAL_COMMUNITY): Admission: EM | Admit: 2008-10-14 | Discharge: 2008-10-14 | Payer: Self-pay | Admitting: Family Medicine

## 2008-10-20 ENCOUNTER — Ambulatory Visit: Payer: Self-pay | Admitting: Internal Medicine

## 2008-10-28 ENCOUNTER — Telehealth: Payer: Self-pay | Admitting: *Deleted

## 2008-11-16 ENCOUNTER — Ambulatory Visit: Payer: Self-pay | Admitting: Family Medicine

## 2008-11-16 DIAGNOSIS — M25539 Pain in unspecified wrist: Secondary | ICD-10-CM | POA: Insufficient documentation

## 2008-11-17 ENCOUNTER — Encounter: Payer: Self-pay | Admitting: Family Medicine

## 2008-12-15 ENCOUNTER — Emergency Department (HOSPITAL_COMMUNITY): Admission: EM | Admit: 2008-12-15 | Discharge: 2008-12-15 | Payer: Self-pay | Admitting: Family Medicine

## 2008-12-23 ENCOUNTER — Telehealth: Payer: Self-pay | Admitting: Internal Medicine

## 2009-01-13 ENCOUNTER — Encounter: Payer: Self-pay | Admitting: Internal Medicine

## 2009-01-13 ENCOUNTER — Ambulatory Visit: Payer: Self-pay | Admitting: Internal Medicine

## 2009-01-13 ENCOUNTER — Ambulatory Visit (HOSPITAL_COMMUNITY): Admission: RE | Admit: 2009-01-13 | Discharge: 2009-01-14 | Payer: Self-pay | Admitting: Internal Medicine

## 2009-01-13 DIAGNOSIS — R002 Palpitations: Secondary | ICD-10-CM | POA: Insufficient documentation

## 2009-01-13 LAB — CONVERTED CEMR LAB: Blood Glucose, Fingerstick: 178

## 2009-01-14 ENCOUNTER — Encounter: Payer: Self-pay | Admitting: Internal Medicine

## 2009-01-14 LAB — CONVERTED CEMR LAB
TSH: 0.927 microintl units/mL (ref 0.350–4.5)
Vit D, 25-Hydroxy: 22 ng/mL — ABNORMAL LOW (ref 30–89)

## 2009-01-18 ENCOUNTER — Emergency Department (HOSPITAL_COMMUNITY): Admission: EM | Admit: 2009-01-18 | Discharge: 2009-01-18 | Payer: Self-pay | Admitting: Family Medicine

## 2009-01-26 ENCOUNTER — Ambulatory Visit (HOSPITAL_COMMUNITY): Admission: RE | Admit: 2009-01-26 | Discharge: 2009-01-26 | Payer: Self-pay | Admitting: Infectious Disease

## 2009-01-26 ENCOUNTER — Ambulatory Visit: Payer: Self-pay | Admitting: Infectious Disease

## 2009-01-26 ENCOUNTER — Encounter: Payer: Self-pay | Admitting: Internal Medicine

## 2009-01-26 DIAGNOSIS — R109 Unspecified abdominal pain: Secondary | ICD-10-CM | POA: Insufficient documentation

## 2009-01-26 LAB — CONVERTED CEMR LAB
Basophils Absolute: 0 10*3/uL (ref 0.0–0.1)
Bilirubin Urine: NEGATIVE
Blood Glucose, Fingerstick: 152
CO2: 28 meq/L (ref 19–32)
Chloride: 100 meq/L (ref 96–112)
Hemoglobin: 13 g/dL (ref 12.0–15.0)
Leukocytes, UA: NEGATIVE
Lymphocytes Relative: 33 % (ref 12–46)
Monocytes Absolute: 0.5 10*3/uL (ref 0.1–1.0)
Neutro Abs: 2.6 10*3/uL (ref 1.7–7.7)
Neutrophils Relative %: 53 % (ref 43–77)
Platelets: 204 10*3/uL (ref 150–400)
Potassium: 3.6 meq/L (ref 3.5–5.3)
RDW: 15.4 % (ref 11.5–15.5)
Sodium: 135 meq/L (ref 135–145)
Specific Gravity, Urine: 1.026 (ref 1.005–1.0)
Urine Glucose: NEGATIVE mg/dL
pH: 5.5 (ref 5.0–8.0)

## 2009-03-17 ENCOUNTER — Telehealth: Payer: Self-pay | Admitting: Internal Medicine

## 2009-03-25 ENCOUNTER — Telehealth: Payer: Self-pay | Admitting: Internal Medicine

## 2009-03-25 ENCOUNTER — Emergency Department (HOSPITAL_COMMUNITY): Admission: EM | Admit: 2009-03-25 | Discharge: 2009-03-25 | Payer: Self-pay | Admitting: Family Medicine

## 2009-04-16 ENCOUNTER — Ambulatory Visit: Payer: Self-pay | Admitting: Internal Medicine

## 2009-05-19 ENCOUNTER — Ambulatory Visit: Payer: Self-pay | Admitting: Internal Medicine

## 2009-05-19 ENCOUNTER — Encounter: Payer: Self-pay | Admitting: Internal Medicine

## 2009-05-19 ENCOUNTER — Ambulatory Visit (HOSPITAL_COMMUNITY): Admission: RE | Admit: 2009-05-19 | Discharge: 2009-05-19 | Payer: Self-pay | Admitting: Internal Medicine

## 2009-05-19 ENCOUNTER — Ambulatory Visit: Payer: Self-pay

## 2009-05-19 ENCOUNTER — Ambulatory Visit: Payer: Self-pay | Admitting: Cardiology

## 2009-05-31 ENCOUNTER — Telehealth: Payer: Self-pay | Admitting: *Deleted

## 2009-06-01 ENCOUNTER — Ambulatory Visit: Payer: Self-pay | Admitting: Internal Medicine

## 2009-06-01 LAB — CONVERTED CEMR LAB: Hgb A1c MFr Bld: 8.3 %

## 2009-06-03 LAB — CONVERTED CEMR LAB
ALT: 24 units/L (ref 0–35)
AST: 19 units/L (ref 0–37)
BUN: 10 mg/dL (ref 6–23)
CO2: 24 meq/L (ref 19–32)
CRP: 0.2 mg/dL (ref <0.6)
Calcium: 9.6 mg/dL (ref 8.4–10.5)
Glucose, Bld: 176 mg/dL — ABNORMAL HIGH (ref 70–99)
Total CHOL/HDL Ratio: 4.6
VLDL: 26 mg/dL (ref 0–40)

## 2009-06-21 ENCOUNTER — Emergency Department (HOSPITAL_COMMUNITY): Admission: EM | Admit: 2009-06-21 | Discharge: 2009-06-21 | Payer: Self-pay | Admitting: Family Medicine

## 2009-07-12 ENCOUNTER — Encounter: Payer: Self-pay | Admitting: Internal Medicine

## 2009-08-11 ENCOUNTER — Telehealth (INDEPENDENT_AMBULATORY_CARE_PROVIDER_SITE_OTHER): Payer: Self-pay | Admitting: *Deleted

## 2009-08-11 ENCOUNTER — Emergency Department (HOSPITAL_COMMUNITY): Admission: EM | Admit: 2009-08-11 | Discharge: 2009-08-11 | Payer: Self-pay | Admitting: Emergency Medicine

## 2009-08-13 ENCOUNTER — Telehealth: Payer: Self-pay | Admitting: Internal Medicine

## 2009-08-16 ENCOUNTER — Ambulatory Visit: Payer: Self-pay | Admitting: Internal Medicine

## 2009-08-16 DIAGNOSIS — R42 Dizziness and giddiness: Secondary | ICD-10-CM | POA: Insufficient documentation

## 2009-08-16 LAB — CONVERTED CEMR LAB
Blood Glucose, AC Bkfst: 205 mg/dL
Hgb A1c MFr Bld: 8.5 %

## 2009-09-22 ENCOUNTER — Ambulatory Visit: Payer: Self-pay | Admitting: Internal Medicine

## 2009-09-22 LAB — CONVERTED CEMR LAB: Vitamin B-12: 389 pg/mL (ref 211–911)

## 2009-10-18 ENCOUNTER — Telehealth: Payer: Self-pay | Admitting: *Deleted

## 2009-10-18 ENCOUNTER — Ambulatory Visit: Payer: Self-pay | Admitting: Internal Medicine

## 2009-10-18 LAB — HM DIABETES FOOT EXAM

## 2009-10-29 ENCOUNTER — Ambulatory Visit: Payer: Self-pay | Admitting: Internal Medicine

## 2009-10-29 DIAGNOSIS — IMO0001 Reserved for inherently not codable concepts without codable children: Secondary | ICD-10-CM | POA: Insufficient documentation

## 2009-11-01 ENCOUNTER — Encounter: Payer: Self-pay | Admitting: Licensed Clinical Social Worker

## 2009-11-01 ENCOUNTER — Encounter: Payer: Self-pay | Admitting: Internal Medicine

## 2009-11-03 ENCOUNTER — Telehealth: Payer: Self-pay | Admitting: Licensed Clinical Social Worker

## 2010-01-07 ENCOUNTER — Emergency Department (HOSPITAL_COMMUNITY)
Admission: EM | Admit: 2010-01-07 | Discharge: 2010-01-07 | Payer: Self-pay | Source: Home / Self Care | Admitting: Emergency Medicine

## 2010-01-18 ENCOUNTER — Ambulatory Visit: Payer: Self-pay | Admitting: Internal Medicine

## 2010-01-18 LAB — CONVERTED CEMR LAB
Blood Glucose, AC Bkfst: 293 mg/dL
HIV: NONREACTIVE
TSH: 1.49 microintl units/mL (ref 0.350–4.5)

## 2010-01-31 ENCOUNTER — Telehealth: Payer: Self-pay | Admitting: Internal Medicine

## 2010-02-01 ENCOUNTER — Ambulatory Visit: Payer: Self-pay | Admitting: Internal Medicine

## 2010-02-01 ENCOUNTER — Telehealth: Payer: Self-pay | Admitting: Internal Medicine

## 2010-02-04 ENCOUNTER — Telehealth (INDEPENDENT_AMBULATORY_CARE_PROVIDER_SITE_OTHER): Payer: Self-pay | Admitting: *Deleted

## 2010-02-08 ENCOUNTER — Ambulatory Visit (HOSPITAL_COMMUNITY): Admission: RE | Admit: 2010-02-08 | Payer: Self-pay | Admitting: Internal Medicine

## 2010-02-17 ENCOUNTER — Ambulatory Visit: Payer: Self-pay | Admitting: Internal Medicine

## 2010-02-17 DIAGNOSIS — T148XXA Other injury of unspecified body region, initial encounter: Secondary | ICD-10-CM | POA: Insufficient documentation

## 2010-02-24 ENCOUNTER — Other Ambulatory Visit
Admission: RE | Admit: 2010-02-24 | Discharge: 2010-02-24 | Payer: Self-pay | Source: Home / Self Care | Admitting: Internal Medicine

## 2010-02-24 ENCOUNTER — Ambulatory Visit: Payer: Self-pay | Admitting: Internal Medicine

## 2010-02-24 DIAGNOSIS — L293 Anogenital pruritus, unspecified: Secondary | ICD-10-CM | POA: Insufficient documentation

## 2010-02-24 DIAGNOSIS — E1142 Type 2 diabetes mellitus with diabetic polyneuropathy: Secondary | ICD-10-CM | POA: Insufficient documentation

## 2010-02-24 LAB — CONVERTED CEMR LAB
Blood in Urine, dipstick: NEGATIVE
Glucose, Urine, Semiquant: >=1000
Ketones, urine, test strip: NEGATIVE
Nitrite: NEGATIVE
WBC Urine, dipstick: NEGATIVE

## 2010-02-25 LAB — CONVERTED CEMR LAB
Albumin: 4.2 g/dL (ref 3.5–5.2)
BUN: 8 mg/dL (ref 6–23)
CO2: 23 meq/L (ref 19–32)
Calcium: 9.4 mg/dL (ref 8.4–10.5)
Candida species: NEGATIVE
Chlamydia, Swab/Urine, PCR: NEGATIVE
Chloride: 100 meq/L (ref 96–112)
Creatinine, Ser: 0.9 mg/dL (ref 0.40–1.20)
Gardnerella vaginalis: POSITIVE — AB
Glucose, Bld: 330 mg/dL — ABNORMAL HIGH (ref 70–99)
Potassium: 4 meq/L (ref 3.5–5.3)

## 2010-03-03 ENCOUNTER — Ambulatory Visit: Payer: Self-pay | Admitting: Obstetrics and Gynecology

## 2010-03-15 ENCOUNTER — Telehealth: Payer: Self-pay | Admitting: Internal Medicine

## 2010-03-27 ENCOUNTER — Encounter: Payer: Self-pay | Admitting: Internal Medicine

## 2010-04-05 NOTE — Progress Notes (Signed)
Summary: Soc. Work  Programme researcher, broadcasting/film/video of Call: [employer] and made referral for them to contact Maiyah.

## 2010-04-05 NOTE — Consult Note (Signed)
Summary: [facility]   Imported By: Louretta Parma 10/18/2009 12:17:59  _____________________________________________________________________  External Attachment:    Type:   Image     Comment:   External Document

## 2010-04-05 NOTE — Progress Notes (Signed)
Summary: diabetes support- new to insulin/dmr  Phone Note Outgoing Call   Call placed by: Jamison Neighbor RD,CDE,  February 04, 2010 10:53 AM Summary of Call: caled to follow up on new to insulin.  left message for patient to call if needed.

## 2010-04-05 NOTE — Assessment & Plan Note (Signed)
Summary: ACUTE-BOTH FEET SORE ON THE BOTTOM/UNABLE TO STAND FOR A LONG...   Vital Signs:  Patient profile:   40 year old female Height:      63 inches (160.02 cm) Weight:      173.4 pounds (78.82 kg) BMI:     30.83 Temp:     97.8 degrees F (36.56 degrees C) oral Pulse rate:   81 / minute BP sitting:   122 / 75  (right arm)  Vitals Entered By: Stanton Kidney Ditzler RN (September 22, 2009 10:56 AM) Is Patient Diabetic? Yes Did you bring your meter with you today? No Pain Assessment Patient in pain? yes     Location: both feet and right leg Intensity: 10 Type: sharp Onset of pain  past 2 weeks Nutritional Status BMI of > 30 = obese Nutritional Status Detail appetite ok CBG Result 225  Have you ever been in a relationship where you felt threatened, hurt or afraid?denies   Does patient need assistance? Functional Status Self care Ambulation Normal Comments Ck both feet and right leg. Sharp pain right leg x 2 weeks.   Primary Care Provider:  Hartley Barefoot MD   History of Present Illness: Adriana Burke is a 40 y/o woman with PMH/ problems as outlined in EMR.  She comes today with the folllowing complaints,  Foot pain B/L- Started around 4 wks ago.                        Pt complaints of B/L foot pain, sharp, continuous, throughout the day, more when stands for long and when gets off from bed.                       She also feels tightnessand tingling  in her toes.     c/o L ankle pain which is old and also of B/L knee pain, both are same as they were before.   She has been on naproxen for her knee and ankle pain, but it didn't help her much.  Denies any fever, chest pain, N/V, diarrhea, urinary changes, change in vision, headache, anorexia or wt loss.   Depression History:      The patient denies a depressed mood most of the day and a diminished interest in her usual daily activities.         Preventive Screening-Counseling & Management  Alcohol-Tobacco     Alcohol type: at  times / MIXED DRINK     Smoking Status: never     Passive Smoke Exposure: no  Caffeine-Diet-Exercise     Does Patient Exercise: no     Type of exercise: walking     Times/week: 4  Current Medications (verified): 1)  Albuterol 90 Mcg/act Aers (Albuterol) .... Inhale 2 Puffs Four Times A Day As Need For Shortness of Breath 2)  Metformin Hcl 500 Mg Tabs (Metformin Hcl) .... Take 1 Tablet By Mouth Two Times A Day 3)  Prodigy Blood Glucose Test  Strp (Glucose Blood) .... Used As Directed. 4)  Prodigy Twist Top Lancets 28g  Misc (Lancets) .... Use As Directed. 5)  Prodigy Autocode Blood Glucose  Devi (Blood Glucose Monitoring Suppl) .... Use To Test Blood Sugar Daily 6)  Lisinopril 2.5 Mg Tabs (Lisinopril) .... Take 1 Tablet By Mouth Once A Day 7)  Naprosyn 500 Mg Tabs (Naproxen) .... Take 1 Tablet By Mouth Two Times A Day 8)  Prozac 20 Mg Caps (Fluoxetine Hcl) .... Take 1  Tablet By Mouth Every Morning 9)  Meclizine Hcl 12.5 Mg Tabs (Meclizine Hcl) .... Take 1 Tablet By Mouth Three Times A Day As Needed For Dizziness  Allergies (verified): No Known Drug Allergies  Review of Systems       as per HPI.Marland Kitchen  Physical Exam  General:  alert, well-developed, well-nourished, and well-hydrated.   Head:  normocephalic and atraumatic.   Eyes:  vision grossly intact.   Ears:  no external deformities.   Lungs:  normal respiratory effort, normal breath sounds, no crackles, and no wheezes.   Heart:  normal rate, regular rhythm, no murmur, no gallop, and no rub.   Abdomen:  soft, normal bowel sounds, no masses, no guarding, no rigidity, and no rebound tenderness.  non-tender.   Msk:  Lower Extremeties- Inspection- no abnormalities, redness, dischrge. Palpation-  non tender ROM- normal  Pulses:  2+ DP    Impression & Recommendations:  Problem # 1:  FOOT PAIN, BILATERAL (ICD-729.5) As per HPI, she has this new foot pain, which seems to be neuropathic in nature. Will check Vit B12 levels  today. Also her Diabetes is not well  controlled and so it might be a contributory factor, so increased Metformin to 1000mg  two times a day and added Glipizide 5 mg XR once daily today.  Also added Duloxetine 30 mg once daily for her pain for 1 week and then 30 two times a day after that. This might help her with her pain and possible Fibromyalgia and also her depression as per Dr. Phillips Odor. Will f/u with pt in a month.  Orders: T-Vitamin B12 ([id number])  Problem # 2:  DM (ICD-250.00) Her CBG today is 225 mg/dl, which is not well controlled. It was 205 at the last visit. So will increase dose of Metformin from 500 mg two times a day to 1000mg  two times a day , and will also add Glipizide 5mg  XR once daily. Will reassess at next visit. The following medications were removed from the medication list:    Metformin Hcl 500 Mg Tabs (Metformin hcl) .Marland Kitchen... Take 1 tablet by mouth two times a day Her updated medication list for this problem includes:    Lisinopril 2.5 Mg Tabs (Lisinopril) .Marland Kitchen... Take 1 tablet by mouth once a day    Glipizide 5 Mg Xr24h-tab (Glipizide) .Marland Kitchen... Take 1 tablet by mouth once a day    Metformin Hcl 1000 Mg Tabs (Metformin hcl) .Marland Kitchen... Take 1 tablet by mouth two times a day  Labs Reviewed: Creat: 0.81 (06/01/2009)    Reviewed HgBA1c results: 8.5 (08/16/2009)  8.3 (06/01/2009)  Problem # 3:  DEPRESSION (ICD-311) She seem sto be stable in terms of her depression. No recent new stressors in life. Will give Duloxetine to her which will help her depression and pain both.  The following medications were removed from the medication list:    Prozac 20 Mg Caps (Fluoxetine hcl) .Marland Kitchen... Take 1 tablet by mouth every morning Her updated medication list for this problem includes:    Cymbalta 30 Mg Cpep (Duloxetine hcl) .Marland Kitchen... Take 1 tablet by mouth once a day for 1 week, then 2 tablets by mouth daily.  Complete Medication List: 1)  Albuterol 90 Mcg/act Aers (Albuterol) .... Inhale 2  puffs four times a day as need for shortness of breath 2)  Prodigy Blood Glucose Test Strp (Glucose blood) .... Used as directed. 3)  Prodigy Twist Top Lancets 28g Misc (Lancets) .... Use as directed. 4)  Prodigy Autocode Blood Glucose  Devi (Blood glucose monitoring suppl) .... Use to test blood sugar daily 5)  Lisinopril 2.5 Mg Tabs (Lisinopril) .... Take 1 tablet by mouth once a day 6)  Naprosyn 500 Mg Tabs (Naproxen) .... Take 1 tablet by mouth two times a day 7)  Meclizine Hcl 12.5 Mg Tabs (Meclizine hcl) .... Take 1 tablet by mouth three times a day as needed for dizziness 8)  Glipizide 5 Mg Xr24h-tab (Glipizide) .... Take 1 tablet by mouth once a day 9)  Hydrocodone-acetaminophen 5-500 Mg Tabs (Hydrocodone-acetaminophen) .... Take 1 tablet by mouth once a day as needed 10)  Cymbalta 30 Mg Cpep (Duloxetine hcl) .... Take 1 tablet by mouth once a day for 1 week, then 2 tablets by mouth daily. 11)  Metformin Hcl 1000 Mg Tabs (Metformin hcl) .... Take 1 tablet by mouth two times a day  Other Orders: Capillary Blood Glucose/CBG ([address])  Patient Instructions: 1)  Please schedule a follow-up appointment in 1 month. 2)  For you Diabetes-  3)            1) we have increased he dose of metformin form 500 mg two times a day to 1000 mg two times a day. 4)            2) we are adding one more medicine for better control of your diabetes, its Glipizide 5mg , once daily. 5)  For your foot and leg pain- 6)            1) We are giving you Duloxetine 30 mg once daily for 1 week and then 30 mg two times a day after that. This med will help you with your pain and also fibromyalgia. 7)            2) we are giving you vicodin for your pain, until the above med starts working. You can take it just once a day when needed. 8)  If you have any concerns or problems regarding taing meds, side effects or anything else, please let s know.  Prescriptions: METFORMIN HCL 1000 MG TABS (METFORMIN HCL) Take 1 tablet by  mouth two times a day  #60 x 5   Entered and Authorized by:   Lyn Hollingshead   Signed by:   Lyn Hollingshead on 09/22/2009   Method used:   Print then Give to Patient   RxID:   [id number] CYMBALTA 30 MG CPEP (DULOXETINE HCL) Take 1 tablet by mouth once a day for 1 week, then 2 tablets by mouth daily.  #60 x 2   Entered and Authorized by:   Lyn Hollingshead   Signed by:   Lyn Hollingshead on 09/22/2009   Method used:   Print then Give to Patient   RxID:   [id number] HYDROCODONE-ACETAMINOPHEN 5-500 MG TABS (HYDROCODONE-ACETAMINOPHEN) Take 1 tablet by mouth once a day as needed  #30 x 0   Entered and Authorized by:   Lyn Hollingshead   Signed by:   Lyn Hollingshead on 09/22/2009   Method used:   Print then Give to Patient   RxID:   [id number]  Process Orders Check Orders Results:     [facility]: ABN not required for this insurance Tests Sent for requisitioning (September 23, 2009 12:25 AM):     09/22/2009: [facility] -- T-Vitamin B12 [[id number]] (signed)    Prevention & Chronic Care Immunizations   Influenza vaccine: Fluvax Non-MCR  (02/05/2008)    Tetanus booster: Not documented    Pneumococcal vaccine: Not documented  Other Screening   Pap smear: Not documented   Pap smear action/deferral: GYN Referral  (06/01/2009)   Smoking status: never  (09/22/2009)  Diabetes Mellitus   HgbA1C: 8.5  (08/16/2009)    Eye exam: Not documented    Foot exam: Not documented   High risk foot: Not documented   Foot care education: Not documented    Urine microalbumin/creatinine ratio: 214.7  (07/24/2008)    Diabetes flowsheet reviewed?: Yes   Progress toward A1C goal: Deteriorated  Lipids   Total Cholesterol: 176  (06/01/2009)   LDL: 112  (06/01/2009)   LDL Direct: Not documented   HDL: 38  (06/01/2009)   Triglycerides: 128  (06/01/2009)    SGOT (AST): 19  (06/01/2009)   SGPT (ALT): 24  (06/01/2009)   Alkaline phosphatase: 87  (06/01/2009)   Total bilirubin:  0.5  (06/01/2009)    Lipid flowsheet reviewed?: Yes   Progress toward LDL goal: Unchanged  Self-Management Support :   Personal Goals (by the next clinic visit) :     Personal A1C goal: 7  (06/01/2009)     Personal blood pressure goal: 130/80  (06/01/2009)     Personal LDL goal: 100  (06/01/2009)    Patient will work on the following items until the next clinic visit to reach self-care goals:     Medications and monitoring: take my medicines every day, check my blood sugar, bring all of my medications to every visit, examine my feet every day  (09/22/2009)     Eating: drink diet soda or water instead of juice or soda, eat more vegetables, use fresh or frozen vegetables, eat fruit for snacks and desserts  (09/22/2009)     Activity: take a 30 minute walk every day, park at the far end of the parking lot  (09/22/2009)    Diabetes self-management support: Written self-care plan, Education handout, Resources for patients handout  (09/22/2009)   Diabetes care plan printed   Diabetes education handout printed   Last diabetes self-management training by diabetes educator: 01/14/2008    Lipid self-management support: Written self-care plan, Education handout, Resources for patients handout  (09/22/2009)   Lipid self-care plan printed.   Lipid education handout printed      Resource handout printed.   Nursing Instructions: Give tetanus booster today   Appended Document: ACUTE-BOTH FEET SORE ON THE BOTTOM/UNABLE TO STAND FOR A LONG... I have discussed the care of this patient in detail with the resident and agree fully with the documentation completed.

## 2010-04-05 NOTE — Progress Notes (Signed)
Summary: refill/gg  Phone Note Refill Request  on March 17, 2009 4:43 PM  Refills Requested: Medication #1:  PRODIGY BLOOD GLUCOSE TEST  STRP used as directed.  Method Requested: Electronic Initial call taken by: Merrie Roof RN,  March 17, 2009 4:43 PM    Prescriptions: PRODIGY BLOOD GLUCOSE TEST  STRP (GLUCOSE BLOOD) used as directed.  #1box x 9   Entered and Authorized by:   Hartley Barefoot MD   Signed by:   Hartley Barefoot MD on 03/19/2009   Method used:   Electronically to        [facility] [phone]* (retail)       [address]       [place], [place]  [address]       Ph: [phone] or [phone]       Fax: [phone]   RxID:   [id number]

## 2010-04-05 NOTE — Assessment & Plan Note (Signed)
Summary: acute-f/u with urgent care/([facility])/cfb   Vital Signs:  Patient profile:   40 year old female Height:      63 inches (160.02 cm) Weight:      173.0 pounds (78.64 kg) BMI:     30.76 Temp:     98.1 degrees F (36.72 degrees C) oral Pulse rate:   81 / minute BP sitting:   123 / 80  (left arm) Cuff size:   regular  Vitals Entered By: Adriana Burke) (January 18, 2010 10:43 AM) CC: pt c/o feet pain, not sleeping well Is Patient Diabetic? Yes Did you bring your meter with you today? No Pain Assessment Patient in pain? yes     Location: feet Intensity: 7 Nutritional Status BMI of > 30 = obese  Have you ever been in a relationship where you felt threatened, hurt or afraid?No   Does patient need assistance? Functional Status Self care Ambulation Normal   Primary Care Provider:  Nelda Bucks DO  CC:  pt c/o feet pain and not sleeping well.  History of Present Illness: 40 yo AAF with PMH of bilateral foot, DM, HLD who came here for foot pain. She has bilateral foot pain for 3 yearsa and has been worse in the past 3 weeks, sharp,constant, worst 7/10, on the bottom. Walking makes it worse. She has seen podiatry, rheumatology and found unremarkable.  She stopped neurontin 1 week ago because of not helping her a lot. She also found cymbalta also not helpful. She has no other c/o, including CP, SOB, diarrhea or dysuria. Has depression, on and off, no SI/HI. No smoking, occasional ETOH, no drug abuse.   Depression History:      The patient denies a depressed mood most of the day and a diminished interest in her usual daily activities.        Comments:  "I have my days".   Preventive Screening-Counseling & Management  Alcohol-Tobacco     Alcohol type: at times / MIXED DRINK     Smoking Status: never     Passive Smoke Exposure: no  Problems Prior to Update: 1)  Fibromyalgia  (ICD-729.1) 2)  Foot Pain, Right  (ICD-729.5) 3)  Foot Pain, Bilateral  (ICD-729.5) 4)   Sx of Abdominal Pain, Periumbilic  (ICD-789.05) 5)  Dizziness  (ICD-780.4) 6)  Pelvic Pain, Right  (ICD-789.09) 7)  Palpitations  (ICD-785.1) 8)  Wrist Pain, Bilateral  (ICD-719.43) 9)  Vitamin D Deficiency  (ICD-268.9) 10)  Rheumatoid Arthritis  (ICD-714.0) 11)  Hyperlipidemia  (ICD-272.4) 12)  Pain in Soft Tissues of Limb  (ICD-729.5) 13)  Hearing Deficit  (ICD-389.9) 14)  Dm  (ICD-250.00) 15)  Chronic Rhinitis  (ICD-472.0) 16)  Proteinuria  (ICD-791.0) 17)  Knee Pain, Bilateral  (ICD-719.46) 18)  Depression  (ICD-311) 19)  Asthma  (ICD-493.90) 20)  Proteinuria  (ICD-791.0) 21)  Tinnitus, Right  (ICD-388.30)  Medications Prior to Update: 1)  Albuterol 90 Mcg/act Aers (Albuterol) .... Inhale 2 Puffs Four Times A Day As Need For Shortness of Breath 2)  Prodigy Blood Glucose Test  Strp (Glucose Blood) .... Used As Directed. 3)  Prodigy Twist Top Lancets 28g  Misc (Lancets) .... Use As Directed. 4)  Prodigy Autocode Blood Glucose  Devi (Blood Glucose Monitoring Suppl) .... Use To Test Blood Sugar Daily 5)  Lisinopril 2.5 Mg Tabs (Lisinopril) .... Take 1 Tablet By Mouth Once A Day 6)  Naprosyn 500 Mg Tabs (Naproxen) .... Take 1 Tablet By Mouth Two Times A Day 7)  Glipizide 5 Mg Xr24h-Tab (Glipizide) .... Take 1 Tablet By Mouth Once A Day 8)  Cymbalta 30 Mg Cpep (Duloxetine Hcl) .... Take 1 Tablet By Mouth Once A Day For 1 Week, Then 2 Tablets By Mouth Daily. 9)  Metformin Hcl 1000 Mg Tabs (Metformin Hcl) .... Take 1 Tablet By Mouth Two Times A Day 10)  Gabapentin 300 Mg Caps (Gabapentin) .Marland Kitchen.. 1 Tablet By Mouth At Bed Time For 1 Week, Then 2 Tablets At Bed Time Thereafter 11)  Cymbalta 60 Mg Cpep (Duloxetine Hcl) .... Take 1 Tablet By Mouth Once A Day With A 30mg  Tablet 12)  Lorazepam 1 Mg Tabs (Lorazepam) .... Take One Tab By Mouth Up To Three Times A Day As Needed For Anxiety/panic Attack  Current Medications (verified): 1)  Albuterol 90 Mcg/act Aers (Albuterol) .... Inhale 2 Puffs  Four Times A Day As Need For Shortness of Breath 2)  Prodigy Blood Glucose Test  Strp (Glucose Blood) .... Used As Directed. 3)  Prodigy Twist Top Lancets 28g  Misc (Lancets) .... Use As Directed. 4)  Prodigy Autocode Blood Glucose  Devi (Blood Glucose Monitoring Suppl) .... Use To Test Blood Sugar Daily 5)  Lisinopril 2.5 Mg Tabs (Lisinopril) .... Take 1 Tablet By Mouth Once A Day 6)  Naprosyn 500 Mg Tabs (Naproxen) .... Take 1 Tablet By Mouth Two Times A Day 7)  Glipizide 5 Mg Xr24h-Tab (Glipizide) .... Take 1 Tablet By Mouth Once A Day 8)  Cymbalta 30 Mg Cpep (Duloxetine Hcl) .... Take 1 Tablet By Mouth Once A Day For 1 Week, Then 2 Tablets By Mouth Daily. 9)  Metformin Hcl 1000 Mg Tabs (Metformin Hcl) .... Take 1 Tablet By Mouth Two Times A Day 10)  Gabapentin 300 Mg Caps (Gabapentin) .Marland Kitchen.. 1 Tablet By Mouth At Bed Time For 1 Week, Then 2 Tablets At Bed Time Thereafter 11)  Cymbalta 60 Mg Cpep (Duloxetine Hcl) .... Take 1 Tablet By Mouth Once A Day With A 30mg  Tablet 12)  Lorazepam 1 Mg Tabs (Lorazepam) .... Take One Tab By Mouth Up To Three Times A Day As Needed For Anxiety/panic Attack  Allergies (verified): No Known Drug Allergies  Past History:  Past Medical History: Last updated: 04/16/2009 Asthma H/o Proteinuria, last Ur microalb/cr 0.19 in 6/07 Question of connective tissue disorder seen by [facility] Rheumatology Rheumatoid Factor pos, Anti CCP pos in 2005 -? early presentation of RF Fibromyalgia (see Dr. Ines Burke note from 05/07/2006) Chronic sinusitis H/o chronic headaches H/o dizzines, neg cerebral angiogram Left foot pain, [facility] Hx Childhood Hip Fracture Diabetes Obesity  Past Surgical History: Last updated: 04/16/2009 C section surgery on L ankle  Family History: Last updated: 07/24/2008 GM had DM Mother HTN Maternal GM had rheumatoid arthritis, had kidney disease, died in 37s. Pt denies that GM had deformities of her hands. No  other family Hx of kidney problems.  No family history of elliptocytosis, or other hematologic disorders  Social History: Last updated: 08/16/2009 Has 6 children.  Lives in [place].  Separated from spouse. Can't work because of her chronic pain 1 mentally retarded, autistic son who is still in diapers (78 y/o in 2011)  for whom the pt is primary caregiver No smoking history  ETOH- rare No drug history  Risk Factors: Smoking Status: never (01/18/2010) Passive Smoke Exposure: no (01/18/2010)  Family History: Reviewed history from 07/24/2008 and no changes required. GM had DM Mother HTN Maternal GM had rheumatoid arthritis, had kidney disease, died in  70s. Pt denies that GM had deformities of her hands. No other family Hx of kidney problems.  No family history of elliptocytosis, or other hematologic disorders  Social History: Reviewed history from 08/16/2009 and no changes required. Has 6 children.  Lives in [place].  Separated from spouse. Can't work because of her chronic pain 1 mentally retarded, autistic son who is still in diapers (3 y/o in 2011)  for whom the pt is primary caregiver No smoking history  ETOH- rare No drug history  Review of Systems  The patient denies fever, hoarseness, chest pain, syncope, dyspnea on exertion, peripheral edema, prolonged cough, hemoptysis, abdominal pain, melena, and hematochezia.    Physical Exam  General:  alert, well-developed, well-nourished, well-hydrated, and overweight-appearing.   Nose:  no nasal discharge.  no nasal discharge.   Mouth:  pharynx pink and moist.  pharynx pink and moist.   Lungs:  normal respiratory effort, normal breath sounds, no crackles, and no wheezes.  normal respiratory effort, normal breath sounds, no crackles, and no wheezes.   Heart:  normal rate, regular rhythm, no murmur, no rub, and no JVD.  normal rate, regular rhythm, no murmur, no rub, and no JVD.   Abdomen:  soft, non-tender, normal bowel  sounds, no distention, and no masses.  soft, non-tender, normal bowel sounds, no distention, and no masses.   Msk:  Both foot bottom has tenderness to touch, but no redness swelling. normal ROM, no joint tenderness, no joint swelling, and no joint warmth.   Pulses:  2+ Extremities:  No edema.  Neurologic:  alert & oriented X3, cranial nerves II-XII intact, strength normal in all extremities, sensation intact to light touch, gait normal, and DTRs symmetrical and normal.     Impression & Recommendations:  Problem # 1:  FOOT PAIN, BILATERAL (ICD-729.5) Assessment Unchanged  The cause of Her foot pain unclear. May be due to diabetic neuropathy, but other possibles include fibromyalgia, HIV, B12 deficiency or thyroid disorder.  So Will check HIV, RPR, TSH, Folate and Vit B12. Since cymbalta is not helpful, will d/c it and put back neurontin and will increase the dose gradually. I have discussed with her in details about better control her DM, continue exercise and get better sleep.   Orders: T-Folic Acid; RBC ([id number])  Problem # 2:  DM (ICD-250.00) Assessment: Deteriorated DM poorly controlled and will increase her glipizide. Advised her weight loss and exercise.  Will increase her glipizide dose.  Her updated medication list for this problem includes:    Lisinopril 2.5 Mg Tabs (Lisinopril) .Marland Kitchen... Take 1 tablet by mouth once a day    Glipizide 5 Mg Xr24h-tab (Glipizide) .Marland Kitchen... Take 2 tablets by mouth once a day    Metformin Hcl 1000 Mg Tabs (Metformin hcl) .Marland Kitchen... Take 1 tablet by mouth two times a day  Orders: T- Capillary Blood Glucose (82948) T-Hgb A1C (in-house) (08657QI)  Her updated medication list for this problem includes:    Lisinopril 2.5 Mg Tabs (Lisinopril) .Marland Kitchen... Take 1 tablet by mouth once a day    Glipizide 5 Mg Xr24h-tab (Glipizide) .Marland Kitchen... Take 2 tablets by mouth once a day    Metformin Hcl 1000 Mg Tabs (Metformin hcl) .Marland Kitchen... Take 1 tablet by mouth two times a day  Labs  Reviewed: Creat: 0.81 (06/01/2009)    Reviewed HgBA1c results: 10.2 (01/18/2010)  8.5 (08/16/2009)  Problem # 3:  DEPRESSION (ICD-311) Assessment: Unchanged Her depression stable, no SI/HI. Continue current meds.  The following medications were removed from  the medication list:    Cymbalta 30 Mg Cpep (Duloxetine hcl) .Marland Kitchen... Take 1 tablet by mouth once a day for 1 week, then 2 tablets by mouth daily.    Cymbalta 60 Mg Cpep (Duloxetine hcl) .Marland Kitchen... Take 1 tablet by mouth once a day with a 30mg  tablet Her updated medication list for this problem includes:    Lorazepam 1 Mg Tabs (Lorazepam) .Marland Kitchen... Take one tab by mouth up to three times a day as needed for anxiety/panic attack  Problem # 4:  HYPERLIPIDEMIA (ICD-272.4) Assessment: Comment Only Continue statin and advised her weight loss and exercise and healthy diet.  Labs Reviewed: SGOT: 19 (06/01/2009)   SGPT: 24 (06/01/2009)  Prior 10 Yr Risk Heart Disease: 3 % (05/19/2009)   HDL:38 (06/01/2009), 37 (04/01/2008)  LDL:112 (06/01/2009), 106 (04/01/2008)  Chol:176 (06/01/2009), 159 (04/01/2008)  Trig:128 (06/01/2009), 78 (04/01/2008)  Complete Medication List: 1)  Albuterol 90 Mcg/act Aers (Albuterol) .... Inhale 2 puffs four times a day as need for shortness of breath 2)  Prodigy Blood Glucose Test Strp (Glucose blood) .... Used as directed. 3)  Prodigy Twist Top Lancets 28g Misc (Lancets) .... Use as directed. 4)  Prodigy Autocode Blood Glucose Devi (Blood glucose monitoring suppl) .... Use to test blood sugar daily 5)  Lisinopril 2.5 Mg Tabs (Lisinopril) .... Take 1 tablet by mouth once a day 6)  Naprosyn 500 Mg Tabs (Naproxen) .... Take 1 tablet by mouth two times a day 7)  Glipizide 5 Mg Xr24h-tab (Glipizide) .... Take 2 tablets by mouth once a day 8)  Metformin Hcl 1000 Mg Tabs (Metformin hcl) .... Take 1 tablet by mouth two times a day 9)  Gabapentin 300 Mg Caps (Gabapentin) .Marland Kitchen.. 1 tablet by mouth at bed time for 1 week, then take 1  tablet twice a day for one week, then take 1 tablet three times a day. 10)  Lorazepam 1 Mg Tabs (Lorazepam) .... Take one tab by mouth up to three times a day as needed for anxiety/panic attack  Other Orders: Mammogram (Screening) (Mammo) T-HIV Antibody  (Reflex) [id number]) T-Syphilis Test (RPR) [phone]) T-TSH [phone]) T-Vitamin B12 [phone]) Admin 1st Vaccine (72536) Flu Vaccine 24yrs + (64403)  Patient Instructions: 1)  Please schedule a follow-up appointment in 1 month with PCP. 2)  It is important that you exercise regularly at least 20 minutes 5 times a week. If you develop chest pain, have severe difficulty breathing, or feel very tired , stop exercising immediately and seek medical attention. 3)  You need to lose weight. Consider a lower calorie diet and regular exercise.  4)  Check your blood sugars regularly. If your readings are usually above : or below 70 you should contact our office. 5)  Check your feet each night for sore areas, calluses or signs of infection. Prescriptions: GLIPIZIDE 5 MG XR24H-TAB (GLIPIZIDE) Take 2 tablets by mouth once a day  #60 x 3   Entered and Authorized by:   Jackson Latino MD   Signed by:   Jackson Latino MD on 01/18/2010   Method used:   Electronically to        [facility] [address] [phone]* (retail)       [address]       [place], [place]  [address]       Ph: [phone] or [phone]       Fax: [phone]   RxID:   [id number] GABAPENTIN 300 MG CAPS (GABAPENTIN)  1 tablet by mouth at bed time for 1 week, then take 1 tablet twice a day for one week, then take 1 tablet three times a day.  #90 x 3   Entered and Authorized by:   Jackson Latino MD   Signed by:   Jackson Latino MD on 01/18/2010   Method used:   [facility]        [facility] [address] [facility] [address] [phone]* (retail)       [address]       [place], [place]  [address]       Ph: [phone] or [phone]        Fax: [phone]   RxID:   [id number]    Orders Added: 1)  T- Capillary Blood Glucose [82948] 2)  T-Hgb A1C (in-house) [83036QW] 3)  Mammogram (Screening) [Mammo] 4)  T-Folic Acid; RBC [59563-87564] 5)  T-HIV Antibody  (Reflex) [[id number]] 6)  T-Syphilis Test (RPR) [66063-01601] 7)  T-TSH [09323-55732] 8)  T-Vitamin B12 [82607-23330] 9)  Admin 1st Vaccine [90471] 10)  Flu Vaccine 73yrs + [90658] 11)  Est. Patient Level IV [20254]   Process Orders Check Orders Results:     [facility]: ABN not required for this insurance Tests Sent for requisitioning (January 18, 2010 1:58 PM):     01/18/2010: [facility] -- T-Folic Acid; RBC [[id number]] (signed)     01/18/2010: [facility] -- T-HIV Antibody  (Reflex) [[id number]] (signed)     01/18/2010: [facility] -- T-Syphilis Test (RPR) [37106-26948] (signed)     01/18/2010: [facility] -- T-TSH [phone] (signed)     01/18/2010: [facility] -- T-Vitamin B12 [93818-29937] (signed)     Prevention & Chronic Care Immunizations   Influenza vaccine: Fluvax 3+  (01/18/2010)    Tetanus booster: Not documented    Pneumococcal vaccine: Not documented  Other Screening   Pap smear: Not documented   Pap smear action/deferral: GYN Referral  (06/01/2009)    Mammogram: BI-RADS CATEGORY 1:  Negative.^MM DIGITAL DIAGNOSTIC BILAT  (01/01/2008)   Mammogram action/deferral: Ordered  (01/18/2010)   Smoking status: never  (01/18/2010)  Diabetes Mellitus   HgbA1C: 10.2  (01/18/2010)    Eye exam: Not documented    Foot exam: yes  (10/18/2009)   Foot exam action/deferral: Do today   High risk foot: Not documented   Foot care education: Done  (10/18/2009)    Urine microalbumin/creatinine ratio: 214.7  (07/24/2008)    Diabetes flowsheet reviewed?: Yes   Progress toward A1C goal: Deteriorated  Lipids   Total Cholesterol: 176   (06/01/2009)   LDL: 112  (06/01/2009)   LDL Direct: Not documented   HDL: 38  (06/01/2009)   Triglycerides: 128  (06/01/2009)    SGOT (AST): 19  (06/01/2009)   SGPT (ALT): 24  (06/01/2009)   Alkaline phosphatase: 87  (06/01/2009)   Total bilirubin: 0.5  (06/01/2009)    Lipid flowsheet reviewed?: Yes   Progress toward LDL goal: Unchanged  Self-Management Support :   Personal Goals (by the next clinic visit) :     Personal A1C goal: 7  (06/01/2009)     Personal blood pressure goal: 130/80  (06/01/2009)     Personal LDL goal: 100  (06/01/2009)    Patient will work on the following items until the next clinic visit to reach self-care goals:     Medications and monitoring: take my medicines every day, examine my feet every day  (  01/18/2010)     Eating: eat foods that are low in salt, eat baked foods instead of fried foods  (01/18/2010)     Activity: take a 30 minute walk every day  (10/29/2009)    Diabetes self-management support: Written self-care plan  (01/18/2010)   Diabetes care plan printed   Last diabetes self-management training by diabetes educator: 01/14/2008    Lipid self-management support: Written self-care plan  (01/18/2010)   Lipid self-care plan printed.   Nursing Instructions: Schedule screening mammogram (see order) Give Flu vaccine today    Laboratory Results   Blood Tests   Date/Time Received: January 18, 2010 11:12 AM Date/Time Reported: Alric Quan  January 18, 2010 11:12 AM    HGBA1C: 10.2%   (Normal Range: Non-Diabetic - 3-6%   Control Diabetic - 6-8%) CBG Fasting:: 293mg /dL      Process Orders Check Orders Results:     [facility]: ABN not required for this insurance Tests Sent for requisitioning (January 18, 2010 1:58 PM):     01/18/2010: [facility] -- T-Folic Acid; RBC [[id number]] (signed)     01/18/2010: [facility] -- T-HIV Antibody  (Reflex) [[id number]] (signed)      01/18/2010: [facility] -- T-Syphilis Test (RPR) [[id number]] (signed)     01/18/2010: [facility] -- T-TSH [[id number]] (signed)     01/18/2010: [facility] -- T-Vitamin B12 [[id number]] (signed)   Flu Vaccine Consent Questions     Do you have a history of severe allergic reactions to this vaccine? no    Any prior history of allergic reactions to egg and/or gelatin? no    Do you have a sensitivity to the preservative Thimersol? no    Do you have a past history of Guillan-Barre Syndrome? no    Do you currently have an acute febrile illness? no    Have you ever had a severe reaction to latex? no    Vaccine information given and explained to patient? yes    Are you currently pregnant? no    Lot Number:[id number]   Exp Date:09/03/2010   Manufacturer: [employer] Given  rightDeltoid IM.Adriana Familia [facility])  January 18, 2010 12:05 PM  Tests Sent for requisitioning (January 18, 2010 12:05 PM):     01/18/2010: [facility] -- T-Folic Acid; RBC [[id number]] (signed)     01/18/2010: [facility] -- T-HIV Antibody  (Reflex) [phone] [id number] (signed)     01/18/2010: [facility] -- T-Syphilis Test (RPR) [phone] (signed)     01/18/2010: [facility] -- T-TSH [phone] (signed)     01/18/2010: [facility] -- T-Vitamin B12 [phone] (signed)    .opcflu

## 2010-04-05 NOTE — Procedures (Signed)
Summary: summary report  summary report   Imported By: Mirna Mires 05/21/2009 15:54:32  _____________________________________________________________________  External Attachment:    Type:   Image     Comment:   External Document

## 2010-04-05 NOTE — Assessment & Plan Note (Signed)
Summary: nep/ palp/ gd   Visit Type:  Follow-up Primary Provider:  Hartley Barefoot MD  CC:  palpitations.  History of Present Illness: Ms Kush is a pleasant 40 yo AAF with a h/o chronic pain of unclear etiology who presents today for evaluation of palpitations and chest discomfort.  She describes having a heaviness and "pulling" sensation within her chest.  These episodes occur every 1-2 days and are associated with shortness of breath and symptoms of anxiety.  She reports that these symptoms have been happening for 6 months.  These episodes typically occur at rest, lasting 2-3 minutes.  She is unaware of any triggers, though she does reports occasional chest discomfort/ heaviness while walking on a treadmill.  She is unaware of anything that improves her discomfort.  She denies associated nausea, diaphoresis, arm/jaw pain, or other symptoms.  She reports having subjective sensation that her heart rate is "too high".  These episodes are both gradual in onset and termination.  She denies presyncope or syncope.   Current Medications (verified): 1)  Albuterol 90 Mcg/act Aers (Albuterol) .... Inhale 2 Puffs Four Times A Day As Need For Shortness of Breath 2)  Metformin Hcl 500 Mg Tabs (Metformin Hcl) .... Take 1 Tablet By Mouth Two Times A Day 3)  Prodigy Blood Glucose Test  Strp (Glucose Blood) .... Used As Directed. 4)  Prodigy Twist Top Lancets 28g  Misc (Lancets) .... Use As Directed. 5)  Voltaren 1 % Gel (Diclofenac Sodium) .... Apply To The Site of Pain, Three Times A Day 6)  Lisinopril 2.5 Mg Tabs (Lisinopril) .... Take 1 Tablet By Mouth Once A Day 7)  Prodigy Autocode Blood Glucose  Devi (Blood Glucose Monitoring Suppl) .... Use To Test Blood Sugar Daily 8)  Advair Diskus 250-50 Mcg/dose Aepb (Fluticasone-Salmeterol) .... 2 Puffs Two Times A Day 9)  Vicodin 5-500 Mg Tabs (Hydrocodone-Acetaminophen) .... Take One Tab By Mouth Three Times Daily As Needed For Pain 10)  Lyrica 75 Mg Caps  (Pregabalin) .... Take 1 Tablet Twice A Day For 1 Week Then If Tolerated Take 1 Tablet Three Times A Day. 11)  Vitamin D (Ergocalciferol) 50000 Unit Caps (Ergocalciferol) .... Take 1 Tablet Once A Week For 8 Weeks.  Allergies (verified): No Known Drug Allergies  Past History:  Past Medical History: Asthma H/o Proteinuria, last Ur microalb/cr 0.19 in 6/07 Question of connective tissue disorder seen by [facility] Rheumatology Rheumatoid Factor pos, Anti CCP pos in 2005 -? early presentation of RF Fibromyalgia (see Dr. Ines Bloomer note from 05/07/2006) Chronic sinusitis H/o chronic headaches H/o dizzines, neg cerebral angiogram Left foot pain, followed by [facility] Hx Childhood Hip Fracture Diabetes Obesity  Past Surgical History: C section surgery on L ankle  Family History: Reviewed history from 07/24/2008 and no changes required. GM had DM Mother HTN Maternal GM had rheumatoid arthritis, had kidney disease, died in 109s. Pt denies that GM had deformities of her hands. No other family Hx of kidney problems.  No family history of elliptocytosis, or other hematologic disorders  Social History: Has 6 children.  Lives in [place].  Seperated from spouse. Can't work because of her chronic pain 1 mentally retarded son (27 y/o in 2011)  Pt is primary caregiver No smoking history  ETOH- rare No drug history  Review of Systems       All systems are reviewed and negative except as listed in the HPI.   Vital Signs:  Patient profile:   40 year old female Height:  63 inches Weight:      175 pounds BMI:     31.11 Pulse rate:   81 / minute BP sitting:   112 / 72  (left arm)  Vitals Entered By: Laurance Flatten CMA (April 16, 2009 9:50 AM)  Physical Exam  General:  obese, NAD Head:  normocephalic and atraumatic Eyes:  PERRLA/EOM intact; conjunctiva and lids normal. Nose:  no deformity, discharge, inflammation, or lesions Mouth:  Teeth, gums and palate  normal. Oral mucosa normal. Neck:  Neck supple, no JVD. No masses, thyromegaly or abnormal cervical nodes. Chest Wall:  nontender Lungs:  Clear bilaterally to auscultation and percussion. Heart:  Non-displaced PMI, chest non-tender; regular rate and rhythm, S1, S2 without murmurs, rubs or gallops. Carotid upstroke normal, no bruit. Normal abdominal aortic size, no bruits. Femorals normal pulses, no bruits. Pedals normal pulses. No edema, no varicosities. Abdomen:  Bowel sounds positive; abdomen soft and non-tender without masses, organomegaly, or hernias noted. No hepatosplenomegaly. Msk:  Back normal, normal gait. Muscle strength and tone normal.  s/p L ankle surgery Pulses:  pulses normal in all 4 extremities Extremities:  No clubbing or cyanosis. Neurologic:  Alert and oriented x 3.  strength/sensation are intact Skin:  Intact without lesions or rashes. Cervical Nodes:  no significant adenopathy Psych:  Normal affect.   EKG  Procedure date:  04/16/2009  Findings:      sinus rhythm 81 bpm, PR 164, normal ekg  Impression & Recommendations:  Problem # 1:  PALPITATIONS (ICD-785.1)  The patient reports occasional symptoms of elevated heart rate on a daily basis of gradual onset/termination.  By history, this sounds like sinus tach.  EKG today is normal.  Will obtain 48 hour holter to evaluate.   Will also obtain echo to evaluate for structual disease.  TSH was previously normal.  No medicine changes today  Her updated medication list for this problem includes:    Lisinopril 2.5 Mg Tabs (Lisinopril) .Marland Kitchen... Take 1 tablet by mouth once a day  Orders: Holter Monitor (Holter Monitor) Echocardiogram (Echo) Treadmill (Treadmill)  Problem # 2:  CHEST PAIN (ICD-786.50)  Pt has chest pain of unclear etiology.  Risk factors for CAD include obesity, HTN, and diabetes. Will obtain GXT myoview to evaluate for ischemia  Her updated medication list for this problem includes:    Lisinopril  2.5 Mg Tabs (Lisinopril) .Marland Kitchen... Take 1 tablet by mouth once a day  Her updated medication list for this problem includes:    Lisinopril 2.5 Mg Tabs (Lisinopril) .Marland Kitchen... Take 1 tablet by mouth once a day  Orders: Holter Monitor (Holter Monitor) Echocardiogram (Echo) Treadmill (Treadmill)  Problem # 3:  HYPERLIPIDEMIA (ICD-272.4) stable  Patient Instructions: 1)  Your physician recommends that you schedule a follow-up appointment in: 4 weeks with Dr Johney Frame 2)  Your physician has requested that you have an echocardiogram.  Echocardiography is a painless test that uses sound waves to create images of your heart. It provides your doctor with information about the size and shape of your heart and how well your heart's chambers and valves are working.  This procedure takes approximately one hour. There are no restrictions for this procedure. 3)  Your physician has requested that you have an exercise tolerance test.  For further information please visit [email or web address]  Please also follow instruction sheet, as given.

## 2010-04-05 NOTE — Assessment & Plan Note (Signed)
Summary: FU VISIT/DS   Vital Signs:  Patient profile:   40 year old female Height:      63 inches (160.02 cm) Weight:      173.1 pounds (78.68 kg) BMI:     30.77 Temp:     98.0 degrees F oral Pulse rate:   77 / minute BP sitting:   110 / 74  (right arm)  Vitals Entered By: Chinita Pester RN (October 29, 2009 9:53 AM) CC: F/U - continus to have bilateal feet pain. Is Patient Diabetic? Yes Did you bring your meter with you today? No Pain Assessment Patient in pain? yes     Location: feet Intensity: 10 Type: sharp Onset of pain  Intermittent; esp. when she stands. Nutritional Status BMI of > 30 = obese CBG Result 270  Have you ever been in a relationship where you felt threatened, hurt or afraid?No   Does patient need assistance? Functional Status Self care Ambulation Normal   Primary Care Provider:  Hartley Barefoot MD  CC:  F/U - continus to have bilateal feet pain.Adriana Burke  History of Present Illness: 40 yo female with PMH ooutlined in the EMR presents today for f/u of her bilateral foot pain.  1.Foot pain: pt continues to experience sharp,constant 1/10  pain with associated burning sentation. The pain is predominately located on the plantar and lateral aspects of her feet.  The pain is present all the time but worsens with weight bearing activity.  Vicodin has not helped the pain in the past.  SHe was recently started on cymbalta and feels this may be helping slightly but not significantly.    Patient had surgery on her left ankle years ago and still has some soreness at the surgical site.  She was seen by Dr. Kellie Simmering a while back and is supposed to make another appointment to see a new rheumatologist.  She was also sent to [facility] for a second rheum opinion and is frustrated that no one can give her an answer and treat her pain.    2. Increased stress and possible panic attack: pt reports significant stress at home.  SHe is the primary care giver for her autistic, totally  dependent 56 y/o son.  She also care for other children at home.  She reports occasionally lashing out at loved ones and getting overly upset for no reason.  She states that she occasionally experiences sudden onset of "feeling crazy" without any provoking incident; these episodes are not associated with chest pain but have been associated with increased HR and mild SOB. She is open to the idea of talking with a couselor/psychologist about her depression/anxiety.  3.DM2:pt states she is not taking her medications regularly b/c she is focused on her foot pain and caring for her children.  She has no other concerns or complaints today.    Depression History:      The patient denies a depressed mood most of the day and a diminished interest in her usual daily activities.  Positive alarm features for depression include impaired concentration (indecisiveness).  However, she denies significant weight loss, significant weight gain, and feelings of worthlessness (guilt).  Positive alarm features for a manic disorder include abnormally & persistently irritable mood.  She denies persistently & abnormally elevated mood.        Comments:  "I have just have a lot going on lately. The other day I just went off. I just don't know." She has 3 children at home; 1 with mental retardation/autism.  Preventive Screening-Counseling & Management  Alcohol-Tobacco     Alcohol type: at times / MIXED DRINK     Smoking Status: never     Passive Smoke Exposure: no  Caffeine-Diet-Exercise     Does Patient Exercise: no     Type of exercise: walking     Times/week: 4  Current Medications (verified): 1)  Albuterol 90 Mcg/act Aers (Albuterol) .... Inhale 2 Puffs Four Times A Day As Need For Shortness of Breath 2)  Prodigy Blood Glucose Test  Strp (Glucose Blood) .... Used As Directed. 3)  Prodigy Twist Top Lancets 28g  Misc (Lancets) .... Use As Directed. 4)  Prodigy Autocode Blood Glucose  Devi (Blood Glucose Monitoring  Suppl) .... Use To Test Blood Sugar Daily 5)  Lisinopril 2.5 Mg Tabs (Lisinopril) .... Take 1 Tablet By Mouth Once A Day 6)  Naprosyn 500 Mg Tabs (Naproxen) .... Take 1 Tablet By Mouth Two Times A Day 7)  Glipizide 5 Mg Xr24h-Tab (Glipizide) .... Take 1 Tablet By Mouth Once A Day 8)  Cymbalta 30 Mg Cpep (Duloxetine Hcl) .... Take 1 Tablet By Mouth Once A Day For 1 Week, Then 2 Tablets By Mouth Daily. 9)  Metformin Hcl 1000 Mg Tabs (Metformin Hcl) .... Take 1 Tablet By Mouth Two Times A Day 10)  Gabapentin 300 Mg Caps (Gabapentin) .Adriana Burke.. 1 Tablet By Mouth At Bed Time For 1 Week, Then 2 Tablets At Bed Time Thereafter 11)  Cymbalta 60 Mg Cpep (Duloxetine Hcl) .... Take 1 Tablet By Mouth Once A Day With A 30mg  Tablet 12)  Lorazepam 1 Mg Tabs (Lorazepam) .... Take One Tab By Mouth Up To Three Times A Day As Needed For Anxiety/panic Attack  Allergies (verified): No Known Drug Allergies  Past History:  Past medical, surgical, family and social histories (including risk factors) reviewed for relevance to current acute and chronic problems.  Past Medical History: Reviewed history from 04/16/2009 and no changes required. Asthma H/o Proteinuria, last Ur microalb/cr 0.19 in 6/07 Question of connective tissue disorder seen by [facility] Rheumatology Rheumatoid Factor pos, Anti CCP pos in 2005 -? early presentation of RF Fibromyalgia (see Dr. Ines Bloomer note from 05/07/2006) Chronic sinusitis H/o chronic headaches H/o dizzines, neg cerebral angiogram Left foot pain, followed by [facility] Hx Childhood Hip Fracture Diabetes Obesity  Past Surgical History: Reviewed history from 04/16/2009 and no changes required. C section surgery on L ankle  Family History: Reviewed history from 07/24/2008 and no changes required. GM had DM Mother HTN Maternal GM had rheumatoid arthritis, had kidney disease, died in 31s. Pt denies that GM had deformities of her hands. No other family Hx of kidney  problems.  No family history of elliptocytosis, or other hematologic disorders  Social History: Reviewed history from 08/16/2009 and no changes required. Has 6 children.  Lives in [place].  Separated from spouse. Can't work because of her chronic pain 1 mentally retarded, autistic son who is still in diapers (35 y/o in 2011)  for whom the pt is primary caregiver No smoking history  ETOH- rare No drug history  Physical Exam  General:  alert, well-developed, well-nourished, and well-hydrated.   Head:  normocephalic and atraumatic.   Eyes:  vision grossly intact.  PERRLA.EOMI.  Anicteric.  Mouth:  pharynx pink and moist.  no erythema and no exudates.   Neck:  supple and no masses.   Lungs:  normal respiratory effort, no intercostal retractions, no accessory muscle use, normal breath sounds, no crackles, and no  wheezes.   Heart:  normal rate, regular rhythm, no murmur, no gallop, and no rub.   Abdomen:  soft, non-tender, normal bowel sounds, no distention, and no masses.   Msk:  normal ROM, no joint tenderness, no joint swelling, no joint warmth, no redness over joints, no joint deformities, no joint instability, and no crepitation.  TTP on plantar and lateral aspects of bilateral feet as well as on anterior surface of all toes.  No other abnormalities on exam Extremities:  No clubbing,cyanosis, or edema. Neurologic:  alert & oriented X3, cranial nerves II-XII intact, strength normal in all extremities, sensation intact to light touch, gait normal, and DTRs symmetrical and normal.   Skin:  turgor normal, color normal, and no rashes.   Psych:  Oriented X3, memory intact for recent and remote, normally interactive, good eye contact, not anxious appearing, and not depressed appearing.     Impression & Recommendations:  Problem # 1:  FOOT PAIN, BILATERAL (ICD-729.5) I am unsure of the etiology of pts foot pain. She has been evaluated by two rheumatologist with an entirely negative workup.   Her presentation is not c/w with plantar fasciitis and there are no abnormalities on exam.  Her symptoms may be related to an underlying diagnosis of fibromyalgia, however,I believe that an evaluation by podiatry is indicated to exclude any other possibilities.  Will refer her today.    Orders: Podiatry Referral (Podiatry)  Problem # 2:  DEPRESSION (ICD-311) Pt presents hx suggestive of strong anxiety component with possible panic d/o.  WIll increase her celexa to help with her symptoms as well as improve pain control as these has offered some slight improvement in pts pain.  WIll also give her a short course of benzos for as needed use to abort panic attack. Advised pt that long-term benzos are not indicated for the tx of depression, anxiety or panic d/o. Pt voiced understanding of this.  Also infomed pt ZO:XWRUE of long -term benzo use including dependence and withdrawl/possible seizure with aprupt cessation.   Will refer her to psychology for further eval and assistance with pts depression/anxiety.  If her symptoms do not improve with cymbalta, will conside transtioning to an SSRI,wellbutrin, or referral to psychiatry for additional recommendations AV:[id number] selection.  Her updated medication list for this problem includes:    Cymbalta 30 Mg Cpep (Duloxetine hcl) .Adriana Burke... Take 1 tablet by mouth once a day for 1 week, then 2 tablets by mouth daily.    Cymbalta 60 Mg Cpep (Duloxetine hcl) .Adriana Burke... Take 1 tablet by mouth once a day with a 30mg  tablet    Lorazepam 1 Mg Tabs (Lorazepam) .Adriana Burke... Take one tab by mouth up to three times a day as needed for anxiety/panic attack  Orders: Psychology Referral (Psychology)  Problem # 3:  DM (ICD-250.00) Pt is not compliant with her medication.  Stressed the importance of good CBG control to prevent severe and debilitating complications of DM2.  Will bring pt back for focused visit to address her DM2.    Her updated medication list for this problem includes:     Lisinopril 2.5 Mg Tabs (Lisinopril) .Adriana Burke... Take 1 tablet by mouth once a day    Glipizide 5 Mg Xr24h-tab (Glipizide) .Adriana Burke... Take 1 tablet by mouth once a day    Metformin Hcl 1000 Mg Tabs (Metformin hcl) .Adriana Burke... Take 1 tablet by mouth two times a day  Labs Reviewed: Creat: 0.81 (06/01/2009)    Reviewed HgBA1c results: 8.5 (08/16/2009)  8.3 (06/01/2009)  Problem # 4:  FIBROMYALGIA (ICD-729.1) Pt has some symptoms c/w fibromyalgia.  WOuld like to refer her to specialist in the field. WIll start with referral to chiropractors in the area specializing in fibromyalgia.  WIll also try to find other physicians or practitioners in the area who specialize in this field and will refer pt for evaluation and continued management.    Her updated medication list for this problem includes:    Naprosyn 500 Mg Tabs (Naproxen) .Adriana Burke... Take 1 tablet by mouth two times a day  Orders: Chiropractic Referral (Chiro)  was spent with >50% face to face time was spent couseling patient about her numerous medical conditions, reviewing previous lab data and notes from precious office visits and sub-specialist consults, discussing case with attending physician and establishing plans for continued care and management of pts multiple medication concerns.  Time was also devoted to coordinating care with other health care providers.  Complete Medication List: 1)  Albuterol 90 Mcg/act Aers (Albuterol) .... Inhale 2 puffs four times a day as need for shortness of breath 2)  Prodigy Blood Glucose Test Strp (Glucose blood) .... Used as directed. 3)  Prodigy Twist Top Lancets 28g Misc (Lancets) .... Use as directed. 4)  Prodigy Autocode Blood Glucose Devi (Blood glucose monitoring suppl) .... Use to test blood sugar daily 5)  Lisinopril 2.5 Mg Tabs (Lisinopril) .... Take 1 tablet by mouth once a day 6)  Naprosyn 500 Mg Tabs (Naproxen) .... Take 1 tablet by mouth two times a day 7)  Glipizide 5 Mg Xr24h-tab (Glipizide) ....  Take 1 tablet by mouth once a day 8)  Cymbalta 30 Mg Cpep (Duloxetine hcl) .... Take 1 tablet by mouth once a day for 1 week, then 2 tablets by mouth daily. 9)  Metformin Hcl 1000 Mg Tabs (Metformin hcl) .... Take 1 tablet by mouth two times a day 10)  Gabapentin 300 Mg Caps (Gabapentin) .Adriana Burke.. 1 tablet by mouth at bed time for 1 week, then 2 tablets at bed time thereafter 11)  Cymbalta 60 Mg Cpep (Duloxetine hcl) .... Take 1 tablet by mouth once a day with a 30mg  tablet 12)  Lorazepam 1 Mg Tabs (Lorazepam) .... Take one tab by mouth up to three times a day as needed for anxiety/panic attack  Other Orders: Capillary Blood Glucose/CBG (16109)  Patient Instructions: 1)  Please schedule a follow-up appointment in 3-4 weeks with Dr. Arvilla Market. 2)  We will refer you to a foot doctor for evaluation of your foot pain. 3)  We will also refer your to a chiropractor and fibromyalgia specialist.   4)  It is very important to go to both of these appts. 5)  We are increasing your cymbalta dose to 90 mg per day.  TAKE 1 60mg  tablet AND 1 30mg  tablet EVERY DAY.  The rx for the 60mg  dose was sent to your pharmacy. 6)  Use the Ativan as needed for relief/prevention of panic attacks. 7)  Keep doing your best to take your glipizide and metformin every day.  This will help avoid the need for insulin injections and will also help avoid kindey failure and blindness from diabetes. Prescriptions: LORAZEPAM 1 MG TABS (LORAZEPAM) Take one tab by mouth up to three times a day as needed for anxiety/panic attack  #90 x 0   Entered and Authorized by:   Nelda Bucks DO   Signed by:   Nelda Bucks DO on 10/29/2009   Method used:   Print then Give  to Patient   RxID:   [id number] CYMBALTA 60 MG CPEP (DULOXETINE HCL) Take 1 tablet by mouth once a day with a 30mg  tablet  #30 x 6   Entered and Authorized by:   Nelda Bucks DO   Signed by:   Nelda Bucks DO on 10/29/2009   Method used:   Electronically to        [facility] [address] [phone]* (retail)       [address]       [place], [place]  [address]       Ph: [phone] or [phone]       Fax: [phone]   RxID:   [id number]   Prevention & Chronic Care Immunizations   Influenza vaccine: Fluvax Non-MCR  (02/05/2008)    Tetanus booster: Not documented    Pneumococcal vaccine: Not documented  Other Screening   Pap smear: Not documented   Pap smear action/deferral: GYN Referral  (06/01/2009)   Smoking status: never  (10/29/2009)  Diabetes Mellitus   HgbA1C: 8.5  (08/16/2009)    Eye exam: Not documented    Foot exam: yes  (10/18/2009)   Foot exam action/deferral: Do today   High risk foot: Not documented   Foot care education: Done  (10/18/2009)    Urine microalbumin/creatinine ratio: 214.7  (07/24/2008)  Lipids   Total Cholesterol: 176  (06/01/2009)   LDL: 112  (06/01/2009)   LDL Direct: Not documented   HDL: 38  (06/01/2009)   Triglycerides: 128  (06/01/2009)    SGOT (AST): 19  (06/01/2009)   SGPT (ALT): 24  (06/01/2009)   Alkaline phosphatase: 87  (06/01/2009)   Total bilirubin: 0.5  (06/01/2009)  Self-Management Support :   Personal Goals (by the next clinic visit) :     Personal A1C goal: 7  (06/01/2009)     Personal blood pressure goal: 130/80  (06/01/2009)     Personal LDL goal: 100  (06/01/2009)    Patient will work on the following items until the next clinic visit to reach self-care goals:     Medications and monitoring: check my blood sugar, bring all of my medications to every visit, examine my feet every day  (10/29/2009)     Eating: eat more vegetables, use fresh or frozen vegetables, eat baked foods instead of fried foods  (10/29/2009)     Activity: take a 30 minute walk every day  (10/29/2009)    Diabetes self-management support: Written self-care plan  (10/29/2009)   Diabetes care plan printed   Last diabetes self-management training by diabetes educator: 01/14/2008    Lipid  self-management support: Written self-care plan  (10/29/2009)   Lipid self-care plan printed.

## 2010-04-05 NOTE — Miscellaneous (Signed)
Summary: SW Referral  Referred by Dr. Arvilla Market. Telephone consult. 20 minutes. 40 y.o AA woman who has not worked in 5 years.  Has Medicaid. She has a myriad of medical and mental health issues including RA, diabetes, HTN, depression, chronic pain.  She has three children, ages 62 (autistic, MR) as well as a 41 year old, and 41 year old.  Her 41 year old gets home from school at [employer].      She has filed for disability but never followed through on it.  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 She lives in [place] and pays [phone] in rent.  She receives child support and survivor's benefits but was not forthcoming on the amount of income coming in.     She participated in counseling with [facility] years ago but has not been in counseling recently.   The plan is to connect her with [facility] therapy and visit with her at her Sept 23rd MD appointment for further assessment and planning.  She is receptive to the plan.

## 2010-04-05 NOTE — Assessment & Plan Note (Signed)
Summary: back/knee pain/gg   Vital Signs:  Patient profile:   40 year old female Height:      63 inches (160.02 cm) Weight:      176.8 pounds (80.36 kg) BMI:     31.43 Temp:     97.8 degrees F (36.56 degrees C) oral Pulse rate:   80 / minute BP sitting:   114 / 72  (right arm)  Vitals Entered By: Stanton Kidney Ditzler RN (June 01, 2009 10:46 AM) Is Patient Diabetic? Yes Did you bring your meter with you today? No Pain Assessment Patient in pain? yes     Location: both hips and knees Intensity: 7 Type: aches Onset of pain  pain is now ongoing past week Nutritional Status BMI of > 30 = obese Nutritional Status Detail appetite ok  Have you ever been in a relationship where you felt threatened, hurt or afraid?denies   Does patient need assistance? Functional Status Self care Ambulation Normal Comments Pain is on going in both hips and knees past week. Ck ears - hurting past week.   Primary Care Provider:  Hartley Barefoot MD   History of Present Illness: Pt is a 40 yo female w/ past  med hx below here for evaluation of bilateral hip pain.  Pain has been ongoing but got worse a week ago.  No known injuries recently.  Also is having pain in both her knees.  No swelling, no redness, no fevers.  She has tried ibu for pain and tylenol and it hasn't seemed to help. She has an appt w/ a rheumatologist in May b/c of known dx of RA.   Also would like to start medication for depression.  Has 14 yo disabled child at home.  Occasionally has difficulty w/ sleeping.  Has depressed mood and anhedonia.  No thoughts of SI/HI and denies prior manic episodes.  Has been on zoloft in the past and not sure if it worked or not.  Also, she would like refills on her meds b/c she has been out of almost everything.   Depression History:      The patient denies a depressed mood most of the day and a diminished interest in her usual daily activities.         Preventive Screening-Counseling &  Management  Alcohol-Tobacco     Alcohol type: at times / MIXED DRINK     Smoking Status: never     Passive Smoke Exposure: no  Caffeine-Diet-Exercise     Does Patient Exercise: no     Type of exercise: walking     Times/week: 4  Current Medications (verified): 1)  Albuterol 90 Mcg/act Aers (Albuterol) .... Inhale 2 Puffs Four Times A Day As Need For Shortness of Breath 2)  Metformin Hcl 500 Mg Tabs (Metformin Hcl) .... Take 1 Tablet By Mouth Two Times A Day 3)  Prodigy Blood Glucose Test  Strp (Glucose Blood) .... Used As Directed. 4)  Prodigy Twist Top Lancets 28g  Misc (Lancets) .... Use As Directed. 5)  Prodigy Autocode Blood Glucose  Devi (Blood Glucose Monitoring Suppl) .... Use To Test Blood Sugar Daily  Allergies (verified): No Known Drug Allergies  Past History:  Past Medical History: Last updated: 04/16/2009 Asthma H/o Proteinuria, last Ur microalb/cr 0.19 in 6/07 Question of connective tissue disorder seen by [facility] Rheumatology Rheumatoid Factor pos, Anti CCP pos in 2005 -? early presentation of RF Fibromyalgia (see Dr. Ines Bloomer note from 05/07/2006) Chronic sinusitis H/o chronic headaches H/o dizzines, neg  cerebral angiogram Left foot pain, followed by [facility] 2 Orthopaedics Hx Childhood Hip Fracture Diabetes Obesity  Social History: Last updated: 04/16/2009 Has 6 children.  Lives in [place].  Seperated from spouse. Can't work because of her chronic pain 1 mentally retarded son (2 y/o in 2011)  Pt is primary caregiver No smoking history  ETOH- rare No drug history  Social History: Reviewed history from 04/16/2009 and no changes required. Has 6 children.  Lives in [place].  Seperated from spouse. Can't work because of her chronic pain 1 mentally retarded son (38 y/o in 2011)  Pt is primary caregiver No smoking history  ETOH- rare No drug history  Review of Systems       as per HPI.  Physical Exam  General:  alert, oriented, no  distress, appears stated age.  Eyes:  anicteric.  Ears:  pierced, TM's nl bilaterally.  Lungs:  normal respiratory effort, no intercostal retractions, no accessory muscle use, and normal breath sounds.   Heart:  normal rate and regular rhythm.   Msk:  No obvious edema, erythema, or warmth in any of the above mentioned painful joints.  She does appear to have some possible changes in her bilateral 5th fingers at the PIP joints, but otherwise very benign exam.  Extremities:  no peripheral edema, clubbing, or cyanoisis. Psych:  slightly depressed appearing.    Impression & Recommendations:  Problem # 1:  RHEUMATOID ARTHRITIS (ICD-714.0) Appears to have known RA which could account for symptoms.  Will try short course of high dose NSAID's to see if that helps.  She does not appear to have a significant synovitis on exam today, so will hold on steroids.  Will, however, check inflammatory markers b/c if these are up, she may need course of low dose prednisone to help her until she makes her rheum appt in May at [facility].  Orders: T-Sed Rate (Automated) [id number]) T-C-Reactive Protein [phone])  Problem # 2:  DEPRESSION (ICD-311) Will try prozac.  Advised to take 1/2 tab for 2 weeks in the am and increase to full tab.  Will f/u in 1 month.  If she has not made significant improvement, can increase dose.  Her updated medication list for this problem includes:    Prozac 20 Mg Caps (Fluoxetine hcl) .Marland Kitchen... Take 1 tablet by mouth every morning  Problem # 3:  DM (ICD-250.00) A1c above goal but she has been off her meds.  Refilled today and f/u repeat BMET in 1 month b/c restarted ACE I.  The following medications were removed from the medication list:    Lisinopril 2.5 Mg Tabs (Lisinopril) .Marland Kitchen... Take 1 tablet by mouth once a day Her updated medication list for this problem includes:    Metformin Hcl 500 Mg Tabs (Metformin hcl) .Marland Kitchen... Take 1 tablet by mouth two times a day    Lisinopril 2.5  Mg Tabs (Lisinopril) .Marland Kitchen... Take 1 tablet by mouth once a day  Orders: T- Capillary Blood Glucose ([id number]) T-Hgb A1C (in-house) (78469GE)  Labs Reviewed: Creat: 0.79 (01/26/2009)    Reviewed HgBA1c results: 8.3 (06/01/2009)  7.7 (01/13/2009)  Problem # 4:  HYPERLIPIDEMIA (ICD-272.4) Lipids today.  Goal LDL < 100, ideally < 70.  Orders: T-Lipid Profile ([id number]) T-Comprehensive Metabolic Panel [phone])  Problem # 5:  Preventive Health Care (ICD-V70.0) Referral to gyn made.  Problem # 6:  ASTHMA (ICD-493.90) Has not been taking any of her meds b/c prescriptions have been out.  Will refill albuterol only and she was told if  she needs it more than once a week to call as she may need her therapy increased but since she has not been taking it and has been doing fine, she may not need it.  The following medications were removed from the medication list:    Advair Diskus 250-50 Mcg/dose Aepb (Fluticasone-salmeterol) .Marland Kitchen... 2 puffs two times a day Her updated medication list for this problem includes:    Albuterol 90 Mcg/act Aers (Albuterol) ..... Inhale 2 puffs four times a day as need for shortness of breath  Complete Medication List: 1)  Albuterol 90 Mcg/act Aers (Albuterol) .... Inhale 2 puffs four times a day as need for shortness of breath 2)  Metformin Hcl 500 Mg Tabs (Metformin hcl) .... Take 1 tablet by mouth two times a day 3)  Prodigy Blood Glucose Test Strp (Glucose blood) .... Used as directed. 4)  Prodigy Twist Top Lancets 28g Misc (Lancets) .... Use as directed. 5)  Prodigy Autocode Blood Glucose Devi (Blood glucose monitoring suppl) .... Use to test blood sugar daily 6)  Lisinopril 2.5 Mg Tabs (Lisinopril) .... Take 1 tablet by mouth once a day 7)  Naprosyn 500 Mg Tabs (Naproxen) .... Take 1 tablet by mouth two times a day 8)  Prozac 20 Mg Caps (Fluoxetine hcl) .... Take 1 tablet by mouth every morning  Other Orders: Gynecologic Referral (Gyn)  Patient  Instructions: 1)  Please make a followup appointment in 1 month for a checkup. 2)  You will be called with any abnormal labwork.  Please make sure your phone number is correct at the front desk. Prescriptions: PROZAC 20 MG CAPS (FLUOXETINE HCL) Take 1 tablet by mouth every morning  #30 x 1   Entered and Authorized by:   Joaquin Courts  MD   Signed by:   Joaquin Courts  MD on 06/01/2009   Method used:   Electronically to        [facility] [address] [phone]* (retail)       [address]       [place], [place]  [address]       Ph: [phone] or [phone]       Fax: [phone]   RxID:   [id number] METFORMIN HCL 500 MG TABS (METFORMIN HCL) Take 1 tablet by mouth two times a day  #60 x 0   Entered and Authorized by:   Joaquin Courts  MD   Signed by:   Joaquin Courts  MD on 06/01/2009   Method used:   [facility]        [facility] [address] [phone]* (retail)       [address]       [place], [place]  [address]       Ph: [phone] or [phone]       Fax: [phone]   RxID:   [id number] ALBUTEROL 90 MCG/ACT AERS (ALBUTEROL) Inhale 2 puffs four times a day as need for shortness of breath  #1 x 6   Entered and Authorized by:   Joaquin Courts  MD   Signed by:   Joaquin Courts  MD on 06/01/2009   Method used:   [facility]        [facility] [address] [phone]* (retail)       [address]       [place], [place]  [address]       Ph: [phone] or [phone]       Fax: [phone]   RxID:   [id number] NAPROSYN 500 MG TABS (NAPROXEN) Take 1 tablet by mouth two times a day  #28 x 0   Entered and Authorized by:   Joaquin Courts  MD   Signed by:   Joaquin Courts  MD on 06/01/2009   Method used:   [facility]        [facility] [address] [phone]* (retail)       [address]       [place], [place]  [address]       Ph: [phone] or [phone]       Fax:  [phone]   RxID:   [id number] LISINOPRIL 2.5 MG TABS (LISINOPRIL) Take 1 tablet by mouth once a day  #30 x 0   Entered and Authorized by:   Joaquin Courts  MD   Signed by:   Joaquin Courts  MD on 06/01/2009   Method used:   [facility]        [facility] [address] [phone]* (retail)       [address]       [place], [place]  [address]       Ph: [phone] or [phone]       Fax: [phone]   RxID:   [id number]  Process Orders Check Orders Results:     [facility]: ABN not required for this insurance Tests Sent for requisitioning (June 02, 2009 8:47 AM):     06/01/2009: [facility] -- T-Lipid Profile [phone] (signed)     06/01/2009: [facility] -- T-Comprehensive Metabolic Panel [[id number]] (signed)     06/01/2009: [facility] -- T-Sed Rate (Automated) [phone] (signed)     06/01/2009: [facility] -- T-C-Reactive Protein [phone] (signed)    Prevention & Chronic Care Immunizations   Influenza vaccine: Fluvax Non-MCR  (02/05/2008)    Tetanus booster: Not documented    Pneumococcal vaccine: Not documented  Other Screening   Pap smear: Not documented   Pap smear action/deferral: GYN Referral  (06/01/2009)   Smoking status: never  (06/01/2009)  Diabetes Mellitus   HgbA1C: 8.3  (06/01/2009)    Eye exam: Not documented    Foot exam: Not documented   High risk foot: Not documented   Foot care education: Not documented    Urine microalbumin/creatinine ratio: 214.7  (07/24/2008)    Diabetes flowsheet reviewed?: Yes   Progress toward A1C goal: Unchanged  Lipids   Total Cholesterol: 159  (04/01/2008)   LDL: 106  (04/01/2008)   LDL Direct: Not documented   HDL: 37  (04/01/2008)   Triglycerides: 78  (04/01/2008)    SGOT (AST): 24  (07/24/2008)   SGPT (ALT): 29  (07/24/2008) CMP ordered    Alkaline phosphatase: 90   (07/24/2008)   Total bilirubin: 0.5  (07/24/2008)    Lipid flowsheet reviewed?: Yes   Progress toward LDL goal: Unchanged  Self-Management Support :   Personal Goals (by the next clinic visit) :     Personal A1C goal: 7  (06/01/2009)     Personal blood pressure goal: 130/80  (06/01/2009)     Personal LDL goal: 100  (06/01/2009)    Patient will work on the following items until the next clinic visit to reach self-care goals:  Medications and monitoring: take my medicines every day, check my blood sugar, check my blood pressure, bring all of my medications to every visit, examine my feet every day  (06/01/2009)     Eating: drink diet soda or water instead of juice or soda, eat more vegetables, use fresh or frozen vegetables, eat fruit for snacks and desserts  (06/01/2009)     Activity: take a 30 minute walk every day  (06/01/2009)    Diabetes self-management support: CBG self-monitoring log, Written self-care plan, Education handout, Resources for patients handout  (06/01/2009)   Diabetes care plan printed   Diabetes education handout printed   Last diabetes self-management training by diabetes educator: 01/14/2008    Lipid self-management support: Written self-care plan, Education handout, Resources for patients handout  (06/01/2009)   Lipid self-care plan printed.   Lipid education handout printed      Resource handout printed.   Nursing Instructions: Gyn referral for screening Pap (see order)   Laboratory Results   Blood Tests   Date/Time Received: June 01, 2009 11:01 AM  Date/Time Reported: Burke Keels  June 01, 2009 11:01 AM   HGBA1C: 8.3%   (Normal Range: Non-Diabetic - 3-6%   Control Diabetic - 6-8%) CBG Fasting:: 185mg /dL

## 2010-04-05 NOTE — Assessment & Plan Note (Signed)
Summary: dizziness/gg   Vital Signs:  Patient profile:   40 year old female Height:      63 inches Weight:      174.1 pounds BMI:     30.95 Temp:     97.4 degrees F oral Pulse rate:   80 / minute BP sitting:   100 / 70  (right arm)  Vitals Entered By: Filomena Jungling NT II (August 16, 2009 10:43 AM) CC: ERFOLLOW-UP, Depression Is Patient Diabetic? Yes Did you bring your meter with you today? No Pain Assessment Patient in pain? no      Nutritional Status BMI of > 30 = obese  Have you ever been in a relationship where you felt threatened, hurt or afraid?No   Does patient need assistance? Functional Status Self care Ambulation Normal   Primary Care Provider:  Hartley Barefoot MD  CC:  ERFOLLOW-UP and Depression.  History of Present Illness: Patient presents for followup following an ED visit about one week ago for dizziness. She received IVF in ER and, contrary to documentation, states that it did not help. Has been taking more fluids as instructed without relief. States that room is spinning and that she feels trouble with her balance with the dizziness. Had been going on for about a week prior to the ED visit. The dizziness has been unchanged since it started. It comes and goes, off and on throughout the day, and starts upon awakening. Moving her head does not exacerbate the symptoms and she denies and hearing loss. Patient denies any hypoglycemia and has checked her sugars with the episodes and they have not been extremely high or low at any point.  Patient also reports periumbilical abdominal pain, just to the right of the umbilicus. It is worse with movement and is cramping. Right now, she reports that the pain is 8/10. At its worst, it is 10/10. It has not been better than 8/10 since it started and has been constant. She describes nausea at times, but has not had any vomiting. She feels that she has had a decreased appetite at times. This has never happened in the past. This pain  started 1 week ago as she was leaving the ER. There have been no changes in bowel habits or radiation of the pain.  When asked about recent stressors and depression, she states that she has an 8 year old son who is severely autistic and in diapers. No other recent stressors were noted.  Depression History:      The patient denies a depressed mood most of the day and a diminished interest in her usual daily activities.         Preventive Screening-Counseling & Management  Alcohol-Tobacco     Alcohol type: at times / MIXED DRINK     Smoking Status: never     Passive Smoke Exposure: no  Caffeine-Diet-Exercise     Does Patient Exercise: no     Type of exercise: walking     Times/week: 4  Current Medications (verified): 1)  Albuterol 90 Mcg/act Aers (Albuterol) .... Inhale 2 Puffs Four Times A Day As Need For Shortness of Breath 2)  Metformin Hcl 500 Mg Tabs (Metformin Hcl) .... Take 1 Tablet By Mouth Two Times A Day 3)  Prodigy Blood Glucose Test  Strp (Glucose Blood) .... Used As Directed. 4)  Prodigy Twist Top Lancets 28g  Misc (Lancets) .... Use As Directed. 5)  Prodigy Autocode Blood Glucose  Devi (Blood Glucose Monitoring Suppl) .... Use To  Test Blood Sugar Daily 6)  Lisinopril 2.5 Mg Tabs (Lisinopril) .... Take 1 Tablet By Mouth Once A Day 7)  Naprosyn 500 Mg Tabs (Naproxen) .... Take 1 Tablet By Mouth Two Times A Day 8)  Prozac 20 Mg Caps (Fluoxetine Hcl) .... Take 1 Tablet By Mouth Every Morning  Allergies (verified): No Known Drug Allergies  Past History:  Past Medical History: Last updated: 04/16/2009 Asthma H/o Proteinuria, last Ur microalb/cr 0.19 in 6/07 Question of connective tissue disorder seen by [facility] Rheumatology Rheumatoid Factor pos, Anti CCP pos in 2005 -? early presentation of RF Fibromyalgia (see Dr. Ines Bloomer note from 05/07/2006) Chronic sinusitis H/o chronic headaches H/o dizzines, neg cerebral angiogram Left foot pain, followed by [facility]  Orthopaedics Hx Childhood Hip Fracture Diabetes Obesity  Social History: Last updated: 08/16/2009 Has 6 children.  Lives in [place].  Separated from spouse. Can't work because of her chronic pain 1 mentally retarded, autistic son who is still in diapers (38 y/o in 2011)  for whom the pt is primary caregiver No smoking history  ETOH- rare No drug history  Social History: Has 6 children.  Lives in [place].  Separated from spouse. Can't work because of her chronic pain 1 mentally retarded, autistic son who is still in diapers (71 y/o in 2011)  for whom the pt is primary caregiver No smoking history  ETOH- rare No drug history  Review of Systems      See HPI  Physical Exam  General:  alert, well-developed, well-nourished, and well-hydrated.   Head:  normocephalic and atraumatic.   Eyes:  vision grossly intact.   Ears:  no external deformities.   Nose:  no external deformity, no external erythema, and no nasal discharge.   Mouth:  pharynx pink and moist.   Lungs:  normal respiratory effort, normal breath sounds, no crackles, and no wheezes.   Heart:  normal rate, regular rhythm, no murmur, no gallop, and no rub.   Abdomen:  soft, normal bowel sounds, no masses, no guarding, no rigidity, and no rebound tenderness.  Tenderness with palpation, especially just to the right of the umbilicus. Small amount of erythema and possible excoriations from frequent itching and palpation in the area. Initially also had apparent pain with palpation on L side of abdomen, but once distracted the patient did not exhibit the same grimace compared with initial palpation. Neurologic:  alert & oriented X3, cranial nerves II-XII intact, and gait normal.   Skin:  turgor normal and erythema with small punctate papules overlying area just right to umbilicus. Patient reports using steroid cream for the rash which appears similar to an eczematous or contact dermatitis reaction. Psych:  Oriented X3, memory  intact for recent and remote, normally interactive, and fair eye contact.  Patient denies depression, but talks about treatment for depression and stresses involved with taking care of her son openly.   Impression & Recommendations:  Problem # 1:  DIZZINESS (ICD-780.4) Patient with reported symptoms c/w vertigo for past two weeks. Lab studies in ED were normal and BPs have all been normal (orthostatics mentioned in ED note as normal as well). Patient denies relief with increased fluid intake. Will add meclizine for vertigo. Have instructed patient to begin by taking meclizine regularly for the first few days, then using as needed for future episodes of dizziness.  Her updated medication list for this problem includes:    Meclizine Hcl 12.5 Mg Tabs (Meclizine hcl) .Marland Kitchen... Take 1 tablet by mouth three times a day  as needed for dizziness  Problem # 2:  Sx of ABDOMINAL PAIN, PERIUMBILIC (ICD-789.05) Patient reports pain that started as she was leaving the ED for evaluation of dizziness and that it has been present since. Patient has no signs of an acute abdomen and labs were completely normal during ED visit. Discussed with patient the possibility of an acute gastritis which could cause similar pain and nausea. Recommended continued intake of plenty of fluids and use of bland diet consisting of soups and other light meals for the next week. If pain not improved or worsens, patient instructed to follow up. Patient denies fever, changes in pain with food intake, or blood in stools, therefore imaging deferred at this visit. With review of past medical history, it appears that patient has had several vague complaints in the past, including dizziness and abdominal pain at this appointment, as well as non-adherence to a regimen for depression, raising the possibilty of conversion disorder. Recommended taking Prozac as prescribed at her last appointment, will proceed with workup including imaging (?Abdominal U/S vs.  CT) if pain persists or symptoms change/worsen.  Problem # 3:  DM (ICD-250.00) Patient's A1c still elevated today. Did not increase metformin at this time given the patient's abdominal complaints, although this would be the next step in her management with possible addition of glipizide or other oral anti-hyperglycemic. Given that this was an acute visit with abdominal symptoms as well, will make changes to meds once patient feeling better.  Her updated medication list for this problem includes:    Metformin Hcl 500 Mg Tabs (Metformin hcl) .Marland Kitchen... Take 1 tablet by mouth two times a day    Lisinopril 2.5 Mg Tabs (Lisinopril) .Marland Kitchen... Take 1 tablet by mouth once a day  Orders: T- Capillary Blood Glucose (01027) T-Hgb A1C (in-house) ([id number])  Labs Reviewed: Creat: 0.81 (06/01/2009)    Reviewed HgBA1c results: 8.5 (08/16/2009)  8.3 (06/01/2009)  Problem # 4:  DEPRESSION (ICD-311) May be main driver for many of the patient's recent complaints as they appear to be vague and varied.  She does state that she has not been taking any anti-depressant medication for at least one week, and thought that before that she was taking Zoloft, although our records indicate that Zoloft was removed in Nov. 2010 and she was switched to Prozac in Mar. 2011. Have advised the patient to pick up the prescription for Prozac prescribed by Dr. Andrey Campanile if she has not done so (as she states that she was still taking Zoloft which didn't seem to help) and to begin taking it.  Her updated medication list for this problem includes:    Prozac 20 Mg Caps (Fluoxetine hcl) .Marland Kitchen... Take 1 tablet by mouth every morning  Complete Medication List: 1)  Albuterol 90 Mcg/act Aers (Albuterol) .... Inhale 2 puffs four times a day as need for shortness of breath 2)  Metformin Hcl 500 Mg Tabs (Metformin hcl) .... Take 1 tablet by mouth two times a day 3)  Prodigy Blood Glucose Test Strp (Glucose blood) .... Used as directed. 4)  Prodigy Twist  Top Lancets 28g Misc (Lancets) .... Use as directed. 5)  Prodigy Autocode Blood Glucose Devi (Blood glucose monitoring suppl) .... Use to test blood sugar daily 6)  Lisinopril 2.5 Mg Tabs (Lisinopril) .... Take 1 tablet by mouth once a day 7)  Naprosyn 500 Mg Tabs (Naproxen) .... Take 1 tablet by mouth two times a day 8)  Prozac 20 Mg Caps (Fluoxetine hcl) .... Take 1 tablet  by mouth every morning 9)  Meclizine Hcl 12.5 Mg Tabs (Meclizine hcl) .... Take 1 tablet by mouth three times a day as needed for dizziness  Patient Instructions: 1)  Please schedule an appointment with your primary doctor in :2 months. 2)  Please continue to drink plenty of fluids and to eat a bland diet of soups and light meals for the next 5-7 days. If symptoms of abdominal pain do not improve or worsen, please call the clinic to be reevaluated. 3)  Check your blood sugars regularly. If your readings are usually above 300 or below 70 you should contact our office. 4)  It is important that your Diabetic A1c level is checked every 3 months. 5)  See your eye doctor yearly to check for diabetic eye damage. Prescriptions: MECLIZINE HCL 12.5 MG TABS (MECLIZINE HCL) Take 1 tablet by mouth three times a day as needed for dizziness  #90 x 1   Entered and Authorized by:   Nilda Riggs MD   Signed by:   Nilda Riggs MD on 08/16/2009   Method used:   Electronically to        [facility]  [address] [phone]* (retail)       [address]       [place], [place]  [address]       Ph: [phone] or [phone]       Fax: [phone]   RxID:   [id number]   Prevention & Chronic Care Immunizations   Influenza vaccine: Fluvax Non-MCR  (02/05/2008)    Tetanus booster: Not documented    Pneumococcal vaccine: Not documented  Other Screening   Pap smear: Not documented   Pap smear action/deferral: GYN Referral  (06/01/2009)   Smoking status: never  (08/16/2009)  Diabetes Mellitus   HgbA1C: 8.5   (08/16/2009)    Eye exam: Not documented    Foot exam: Not documented   High risk foot: Not documented   Foot care education: Not documented    Urine microalbumin/creatinine ratio: 214.7  (07/24/2008)  Lipids   Total Cholesterol: 176  (06/01/2009)   LDL: 112  (06/01/2009)   LDL Direct: Not documented   HDL: 38  (06/01/2009)   Triglycerides: 128  (06/01/2009)    SGOT (AST): 19  (06/01/2009)   SGPT (ALT): 24  (06/01/2009)   Alkaline phosphatase: 87  (06/01/2009)   Total bilirubin: 0.5  (06/01/2009)  Self-Management Support :   Personal Goals (by the next clinic visit) :     Personal A1C goal: 7  (06/01/2009)     Personal blood pressure goal: 130/80  (06/01/2009)     Personal LDL goal: 100  (06/01/2009)    Patient will work on the following items until the next clinic visit to reach self-care goals:     Medications and monitoring: take my medicines every day, check my blood sugar, examine my feet every day  (08/16/2009)     Eating: drink diet soda or water instead of juice or soda, eat more vegetables, eat foods that are low in salt, eat baked foods instead of fried foods, eat fruit for snacks and desserts  (08/16/2009)     Activity: take a 30 minute walk every day  (08/16/2009)    Diabetes self-management support: Education handout  (08/16/2009)   Diabetes education handout printed   Last diabetes self-management training by diabetes educator: 01/14/2008    Lipid self-management support: Education handout  (08/16/2009)     Lipid  education handout printed     Laboratory Results   Blood Tests   Date/Time Received: August 16, 2009 11:05 AM Date/Time Reported: Alric Quan  August 16, 2009 11:05 AM   HGBA1C: 8.5%   (Normal Range: Non-Diabetic - 3-6%   Control Diabetic - 6-8%) CBG Fasting:: 205mg /dL

## 2010-04-05 NOTE — Progress Notes (Signed)
Summary: phone/gg  Phone Note Call from Patient   Caller: Patient Summary of Call: Pt called with c/o dizziness, she was seen in ED last week for the same problem.  She was given IV's and labs.   She is dizzy day and night.  She is able to walk around but has no strength.  No other c/o. She wants f/u with [name]. Pt 3 [phone]  Initial call taken by: Merrie Roof RN,  August 13, 2009 12:18 PM  Follow-up for Phone Call        appt monday ok Follow-up by: Julaine Fusi  DO,  August 13, 2009 12:28 PM

## 2010-04-05 NOTE — Miscellaneous (Signed)
Summary: Orders Update  Clinical Lists Changes  Orders: Added new Referral order of Social Work Referral (Social ) - Signed 

## 2010-04-05 NOTE — Progress Notes (Signed)
Summary: phone/gg  Phone Note Call from Patient   Caller: Patient Summary of Call: Pt called with c/o continued foot pain.  She wants to be seen before scheduled appointment. Meds are not helping and pain is continuous.  Will see today Initial call taken by: Merrie Roof RN,  October 18, 2009 11:38 AM

## 2010-04-05 NOTE — Progress Notes (Signed)
Summary: phone/gg  Phone Note Call from Patient   Caller: Patient Summary of Call: Pt called c/o hip pain and low back and knees.  increase pain with ambulation.  Onset 1 year will see tomorrow Initial call taken by: Merrie Roof RN,  May 31, 2009 4:25 PM

## 2010-04-05 NOTE — Assessment & Plan Note (Signed)
Summary: foot pain/gg   Vital Signs:  Patient profile:   40 year old female Height:      63 inches (160.02 cm) Weight:      173.7 pounds (78.95 kg) BMI:     30.88 Temp:     97.6 degrees F oral Pulse rate:   77 / minute BP sitting:   107 / 70  (right arm)  Vitals Entered By: Chinita Pester RN (October 18, 2009 1:54 PM) CC: Righy foot and back of leg pain., Headache Is Patient Diabetic? Yes Did you bring your meter with you today? No Pain Assessment Patient in pain? yes     Location: right foot Intensity: 10 Type: burning/sharp Onset of pain  Constant Nutritional Status BMI of > 30 = obese CBG Result 202  Have you ever been in a relationship where you felt threatened, hurt or afraid?Unable to ask; daughters w/pt.   Does patient need assistance? Functional Status Self care Ambulation Normal   Diabetic Foot Exam Last Podiatry Exam Date: 10/18/2009  Foot Inspection Is there a history of a foot ulcer?              No Is there a foot ulcer now?              No Can the patient see the bottom of their feet?          Yes Are the shoes appropriate in style and fit?          Yes Is there swelling or an abnormal foot shape?          No Are the toenails long?                No Are the toenails thick?                No Are the toenails ingrown?              No Is there heavy callous build-up?              No Is there pain in the calf muscle (Intermittent claudication) when walking?    YesIs there foot or ankle muscle weakness?            No  Diabetic Foot Care Education Patient educated on appropriate care of diabetic feet.  Pulse Check          Right Foot          Left Foot Dorsalis Pedis:        normal            normal Comments: Decreased sensation right foot on monofilament test. Sometimes she has right calf pain when walking.   10-g (5.07) Semmes-Weinstein Monofilament Test Performed by: Chinita Pester RN          Right Foot          Left Foot Visual Inspection      normal         normal Test Control      normal         normal Site 1         normal         normal Site 2         normal         normal Site 3         normal         normal Site 4  normal         normal Site 5         normal         normal Site 6         normal         normal Site 7         normal         normal Site 8         normal         normal Site 9         normal         normal  Impression      normal         normal   Primary Care Provider:  Hartley Barefoot MD  CC:  Righy foot and back of leg pain. and Headache.  History of Present Illness: 40 yo female with PMH of asthma, depression, DM type II, RA presents with sharp pain and burning senstation  on her right foot that is 10/10 in severity.  The pain is on the bottom of her foor and on the lateral side , feel like little knots when her veins pop out.  It hurts when she walks and even when she is sitting still.  This has been going on in the past few months.  She was given cymbalta and vicodine and reports that they are not helping relieving the pain.  The medications make her feel like spacing out and crying.   Patient had surgery on her left ankle years ago and still has some soreness on the left and her orthopedic surgeon.  She was seen by Dr. Kellie Simmering a while back and is supposed to make another appointment to see a new rheumatologist.  She was also sent to [facility] for a second opinion and is frustrated that no one can give her an answer and treat her pain.    Depression History:      The patient denies a depressed mood most of the day and a diminished interest in her usual daily activities.              Preventive Screening-Counseling & Management  Alcohol-Tobacco     Alcohol type: at times / MIXED DRINK     Smoking Status: never     Passive Smoke Exposure: no  Caffeine-Diet-Exercise     Does Patient Exercise: no  Allergies: No Known Drug Allergies  Review of Systems       nausea and vomiting (chronic) but  not on any meds  Physical Exam  General:  alert, well-developed, well-nourished, and well-hydrated.   Lungs:  normal respiratory effort, no intercostal retractions, no accessory muscle use, normal breath sounds, no crackles, and no wheezes.   Heart:  normal rate, regular rhythm, no murmur, no gallop, and no rub.   Abdomen:  soft, non-tender, normal bowel sounds, no distention, and no masses.   Extremities:  Tenderness on right foot-plantar and lateral side, no swelling or erythema. Normal ROM. Intact senation bilaterally.Negative straight leg raising test. Left foot- mild swelling of ankle but no tenderness.   Neurologic:  alert & oriented X3 and cranial nerves II-XII intact.    Diabetes Management Exam:    Foot Exam (with socks and/or shoes not present):       Sensory-Monofilament:          Left foot: normal          Right foot: normal   Impression &  Recommendations:  Problem # 1:  FOOT PAIN, RIGHT (ICD-729.5) Vit B12 was WNL on 09/2009.  She reports that Cymbalta and Vicodin do not help alleviate her pain.  She tried Neurontin in the past without relief.  I feel that she is probably has fibromyalgia even though she does not have point tenderness on shoulder, elbow, and thigh. She complains of very nonspecific pain all over her body.  Her rheumatologic work up has been negative with nonspecific antibodies.  Try Gabapentin 300mg  1 tablet at bedtime  x 1 week, then increase to 600mg    Problem # 2:  DM (ICD-250.00) Assessment: Deteriorated Uncontrolled. Patient has not been taking her Glipizide because she did not get it filled last time.  She said she has been having memory problems.   I reviewed her labs and her last HbA1c was 8.5 in June 2011.  CBG today was 202. Continue current medications and follow up with her PCP 10/30/09.  Her updated medication list for this problem includes:    Lisinopril 2.5 Mg Tabs (Lisinopril) .Marland Kitchen... Take 1 tablet by mouth once a day    Glipizide 5 Mg  Xr24h-tab (Glipizide) .Marland Kitchen... Take 1 tablet by mouth once a day    Metformin Hcl 1000 Mg Tabs (Metformin hcl) .Marland Kitchen... Take 1 tablet by mouth two times a day  Problem # 3:  DEPRESSION (ICD-311) Continue Cymbalta 30mg  two times a day because patient reports improving her depression.   Denies any SI/HI.    Her updated medication list for this problem includes:    Cymbalta 30 Mg Cpep (Duloxetine hcl) .Marland Kitchen... Take 1 tablet by mouth once a day for 1 week, then 2 tablets by mouth daily.  Problem # 4:  ASTHMA (ICD-493.90) Doing well with Albuterol.  No wheezes on physical examination Continue current medication Her updated medication list for this problem includes:    Albuterol 90 Mcg/act Aers (Albuterol) ..... Inhale 2 puffs four times a day as need for shortness of breath  Complete Medication List: 1)  Albuterol 90 Mcg/act Aers (Albuterol) .... Inhale 2 puffs four times a day as need for shortness of breath 2)  Prodigy Blood Glucose Test Strp (Glucose blood) .... Used as directed. 3)  Prodigy Twist Top Lancets 28g Misc (Lancets) .... Use as directed. 4)  Prodigy Autocode Blood Glucose Devi (Blood glucose monitoring suppl) .... Use to test blood sugar daily 5)  Lisinopril 2.5 Mg Tabs (Lisinopril) .... Take 1 tablet by mouth once a day 6)  Naprosyn 500 Mg Tabs (Naproxen) .... Take 1 tablet by mouth two times a day 7)  Glipizide 5 Mg Xr24h-tab (Glipizide) .... Take 1 tablet by mouth once a day 8)  Cymbalta 30 Mg Cpep (Duloxetine hcl) .... Take 1 tablet by mouth once a day for 1 week, then 2 tablets by mouth daily. 9)  Metformin Hcl 1000 Mg Tabs (Metformin hcl) .... Take 1 tablet by mouth two times a day 10)  Gabapentin 300 Mg Caps (Gabapentin) .Marland Kitchen.. 1 tablet by mouth at bed time for 1 week, then 2 tablets at bed time thereafter  Other Orders: Capillary Blood Glucose/CBG (16109)  Patient Instructions: 1)  1. Start taking Gabapentin 300mg  1 tablet at bed time x 1 week, then increase to 600mg  at bed  time 2)  2. Continue Cymbalta 60mg  3)  3. Follow up with Dr.Mills 10/30/09 Prescriptions: GABAPENTIN 300 MG CAPS (GABAPENTIN) 1 tablet by mouth at bed time for 1 week, then 2 tablets at bed time thereafter  #60  x 3   Entered and Authorized by:   Rosana Berger MD   Signed by:   Rosana Berger MD on 10/18/2009   Method used:   [facility]        [facility] [address] [phone]* (retail)       [address]       [place], [place]  [address]       Ph: [phone] or [phone]       Fax: [phone]   RxID:   [id number]   Prevention & Chronic Care Immunizations   Influenza vaccine: Fluvax Non-MCR  (02/05/2008)    Tetanus booster: Not documented    Pneumococcal vaccine: Not documented  Other Screening   Pap smear: Not documented   Pap smear action/deferral: GYN Referral  (06/01/2009)   Smoking status: never  (10/18/2009)  Diabetes Mellitus   HgbA1C: 8.5  (08/16/2009)    Eye exam: Not documented    Foot exam: yes  (10/18/2009)   Foot exam action/deferral: Do today   High risk foot: Not documented   Foot care education: Done  (10/18/2009)    Urine microalbumin/creatinine ratio: 214.7  (07/24/2008)  Lipids   Total Cholesterol: 176  (06/01/2009)   LDL: 112  (06/01/2009)   LDL Direct: Not documented   HDL: 38  (06/01/2009)   Triglycerides: 128  (06/01/2009)    SGOT (AST): 19  (06/01/2009)   SGPT (ALT): 24  (06/01/2009)   Alkaline phosphatase: 87  (06/01/2009)   Total bilirubin: 0.5  (06/01/2009)  Self-Management Support :   Personal Goals (by the next clinic visit) :     Personal A1C goal: 7  (06/01/2009)     Personal blood pressure goal: 130/80  (06/01/2009)     Personal LDL goal: 100  (06/01/2009)    Patient will work on the following items until the next clinic visit to reach self-care goals:     Medications and monitoring: bring all of my medications to every visit  (10/18/2009)     Eating: use fresh or frozen vegetables, eat baked  foods instead of fried foods  (10/18/2009)     Activity: take a 30 minute walk every day  (10/18/2009)    Diabetes self-management support: Written self-care plan  (10/18/2009)   Diabetes care plan printed   Last diabetes self-management training by diabetes educator: 01/14/2008    Lipid self-management support: Written self-care plan  (10/18/2009)   Lipid self-care plan printed.   Nursing Instructions: Diabetic foot exam today     Appended Document: foot pain/gg As far as treatment for the foot pain- she did try lyrica without help. She thinks she tried  neurontin=gabapentin but ? a lower dose which is why we are trying it now and will slowly increase the dose if she reports any relief from Neurontin. We might also consider amytriptyline in the future at a low dose. We also encourage her to continue exercising as she is doing it right now.

## 2010-04-05 NOTE — Progress Notes (Signed)
Summary: phone/gg  Phone Note Call from Patient   Caller: Patient Summary of Call: Pt called with c/o headache, around entire head and neck area.  Also feels tired. Has nuasea when eats. Pt also states hard to swollow. Onset 4 - 5 days. pain is constant.  Denies visual problems.   Pt is an apartment with alot of mold and she thinks this may becausing the problem. We have not appointments available this week.  pt referred to [facility]. Initial call taken by: Merrie Roof RN,  March 25, 2009 11:53 AM  Follow-up for Phone Call        Agree with referal to urgent care. Follow-up by: Blanch Media MD,  March 25, 2009 12:38 PM

## 2010-04-05 NOTE — Progress Notes (Signed)
Summary: prescriptions/dmr  Phone Note Outgoing Call   Call placed by: Jamison Neighbor RD,CDE,  February 01, 2010 5:19 PM Summary of Call: prescriptions for insulin start and new meter per [facility] regulations  Follow-up for Phone Call        Rx submitted electronically Follow-up by: Nelda Bucks DO,  February 04, 2010 9:10 AM    New/Updated Medications: LANTUS SOLOSTAR 100 UNIT/ML SOLN (INSULIN GLARGINE) inject 10 units once daily in morning for 3 days then increase by 1 unit each day until morning blood sugars 90-130 range [BMN] ACCU-CHEK AVIVA  STRP (GLUCOSE BLOOD) use to test blood sugar every morning before breakfast and other times as needed up to 3 times a day ACCU-CHEK MULTICLIX LANCETS  MISC (LANCETS) use to check blood sugar up to 3 times daily [BMN] Prescriptions: ACCU-CHEK MULTICLIX LANCETS  MISC (LANCETS) use to check blood sugar up to 3 times daily Brand medically necessary #100 x 11   Entered by:   Jamison Neighbor RD,CDE   Authorized by:   Nelda Bucks DO   Signed by:   Nelda Bucks DO on 02/04/2010   Method used:   [facility]        [facility] [address] [phone]* (retail)       [address]       [place], [place]  [address]       Ph: [phone] or [phone]       Fax: [phone]   RxID:   [id number] ACCU-CHEK AVIVA  STRP (GLUCOSE BLOOD) use to test blood sugar every morning before breakfast and other times as needed up to 3 times a day  #100 x 11   Entered by:   Jamison Neighbor RD,CDE   Authorized by:   Nelda Bucks DO   Signed by:   Nelda Bucks DO on 02/04/2010   Method used:   [facility]ronically to        [facility] [address] [phone]* (retail)       [address]       [place], [place]  [address]       Ph: [phone] or [phone]       Fax: [phone]   RxID:   [id number] LANTUS SOLOSTAR 100 UNIT/ML SOLN (INSULIN GLARGINE) inject 10 units once daily in morning for 3 days then increase by 1 unit  each day until morning blood sugars 90-130 range Brand medically necessary #1 box x 1   Entered by:   Jamison Neighbor RD,CDE   Authorized by:   Nelda Bucks DO   Signed by:   Nelda Bucks DO on 02/04/2010   Method used:   [facility]ronically to        [facility] [address] [phone]* (retail)       [address]       [place], [place]  [address]       Ph: [phone] or [phone]       Fax: [phone]   RxID:   [id number] PRODIGY MINI PEN NEEDLES 31G X 5 MM MISC (INSULIN PEN NEEDLE) use to inject lantus insulin once daily in morning  #100 x 11   Entered by:   Jamison Neighbor RD,CDE   Authorized by:   Nelda Bucks DO   Signed by:   Nelda Bucks DO on 02/04/2010   Method used:   [facility]ron[facility] to  [facility] [address] [phone]* (retail)       [address]       [place], [place]  [address]       Ph: [phone] or [phone]       Fax: [phone]   RxID:   [id number]

## 2010-04-05 NOTE — Progress Notes (Signed)
Summary: phone/gg  Phone Note Call from Patient   Caller: Patient Summary of Call: Pt c/o abd soreness  x 2 days, ( feels like she did sit ups )  denies nausea. Also c/o dizziness  today.  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   CBG 359 before meal, has been taking metformin. She states CBG's are always elevated.  appointment given for tomorrow Initial call taken by: Merrie Roof RN,  January 31, 2010 12:25 PM  Follow-up for Phone Call        agree need to get CBGs undercontrol I will also put in for a referral to Jamison Neighbor. Follow-up by: Julaine Fusi  DO,  January 31, 2010 1:26 PM  Additional Follow-up for Phone Call Additional follow up Details #1::        appointment made to meet with Jahnya after she sees Dr. Threasa Beards on 02/01/10 Additional Follow-up by: Jamison Neighbor RD,CDE,  January 31, 2010 1:59 PM

## 2010-04-05 NOTE — Progress Notes (Signed)
Summary: phone note/gp  Phone Note Call from Patient   Caller: Patient Summary of Call: Pt. called and stated she has been dizzy and  very weak times several days.  CBG- this morning @0900am  was 238.  And when she walks; she feels like she might pass out.  Wants to be seen this afternoon if possible. I will talk to the Attending. Initial call taken by: Chinita Pester RN,  August 11, 2009 2:20 PM  Follow-up for Phone Call        Pt. needs to go to the ED per The Attending.  Pt. was called back and instructed to go to the ED and she agreed.  Her daughter will drive her. Follow-up by: Chinita Pester RN,  August 11, 2009 2:29 PM

## 2010-04-05 NOTE — Assessment & Plan Note (Signed)
Summary: diabetes/dmr   Vital Signs:  Patient profile:   39 year old female Weight:      170.1 pounds BMI:     30.24  Allergies: No Known Drug Allergies   Complete Medication List: 1)  Albuterol 90 Mcg/act Aers (Albuterol) .... Inhale 2 puffs four times a day as need for shortness of breath 2)  Prodigy Blood Glucose Test Strp (Glucose blood) .... Used as directed. 3)  Prodigy Twist Top Lancets 28g Misc (Lancets) .... Use as directed. 4)  Prodigy Autocode Blood Glucose Devi (Blood glucose monitoring suppl) .... Use to test blood sugar daily 5)  Lisinopril 2.5 Mg Tabs (Lisinopril) .... Take 1 tablet by mouth once a day 6)  Naprosyn 500 Mg Tabs (Naproxen) .... Take 1 tablet by mouth two times a day 7)  Metformin Hcl 1000 Mg Tabs (Metformin hcl) .... Take 1 tablet by mouth two times a day 8)  Gabapentin 300 Mg Caps (Gabapentin) .Marland Kitchen.. 1 tablet by mouth at bed time for 1 week, then take 1 tablet twice a day for one week, then take 1 tablet three times a day. 9)  Lorazepam 1 Mg Tabs (Lorazepam) .... Take one tab by mouth up to three times a day as needed for anxiety/panic attack 10)  Prodigy Mini Pen Needles 31g X 5 Mm Misc (Insulin pen needle) .... Use to inject lantus insulin once daily in morning 11)  Lantus Solostar 100 Unit/ml Soln (Insulin glargine) .... Inject 10 units once daily in morning for 3 days then increase by 1 unit each day until moroingn blood sugars 90-130 range  Other Orders: DSMT (Medicaid) 60 Minutes [phone])  Patient Instructions: 1)  make follow up appointment with doctor 2)  stop glipizide 3)  decrease metformin to 500 mg once daily with food 4)  Check blood sugar daily before breakfast and more times a day as needed 5)  Inject 10 units lantus once daily for 3 days then start increasing by 1 unti each day until morning blood sugars are between 90-130   Orders Added: 1)  DSMT (Medicaid) 60 Minutes [99404]    Diabetes Self Management Training  PCP: Nelda Bucks DO Date diagnosed with diabetes: 01/05/2008 Diabetes Type: Type 2 non-insulin treated Other persons present: no Current smoking Status: never  Vital Signs Todays Weight: 170.1lb  in BMI 30.24in-lbs   Assessment 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 patient reports symptoms of hyperglycemia and nausea associated with metformin at both 500 mg and 1000 mg doses. Has been tkaing only one glipizide daily instead of two. Gives fiance his insulin injections- gave herself nad injection of saline today and is very willing and motivated to try insulin to see if it will help her feels better    Monitoring Self monitoring blood glucose 2 times a day Name of Meter  Accucheck Aviva      Nutrition assessment What beverages do you drink?  says she is hooked on pepsi and trying to cut back Diabetes Disease Process  Discussed today State own type of diabetes: Needs review/assistance   State diabetes is treated by meal plan-exercise-medication-monitoring-education: Needs review/assistance    Medications State name-action-dose-duration-side effects-and time to take medication: Needs review/assistance   State appropriate timing of food related to medication: Needs review/assistance   Demonstrates/verbalizes site selection and rotation for injections Needs review/assistance  Correctly draw up and administer insulin-[place]-Symlin-glucagon: Needs review/assistance    Nutritional Management  Monitoring State purpose and frequency of monitoring BG-ketones-HgbA1C  : Needs review/assistance   Perform glucose monitoring/ketone testing and record results correctly: Needs review/assistance    State target blood glucose and HgbA1C goals: Needs review/assistance    Complications State the causes-signs  and symptoms and prevention of Hyperglycemia: Needs review/assistance   Explain proper treatment of hyperglycemia: Needs review/assistance    Exercise Diabetes Management Education Done: 02/01/2010    BEHAVIORAL GOALS INITIAL Utilizing medications if for therapeutic effectiveness: inject insulin daily Monitoring blood glucose levels daily: check daily before breakfast        spoke with Dr. Arvilla Market who agreed to patient starting insulin, stopping glipizide and decreasing her metformin  Diabetes Self Management Support: friend Darryl and [facility] staff follow- up with CDE: 3-4 weeks to reveiw blood sugars and progress with soda consumption

## 2010-04-07 NOTE — Assessment & Plan Note (Signed)
Summary: low back, R hip pain- 7/10pain scale/pcp-mills/hla   Vital Signs:  Patient profile:   40 year old female Height:      63 inches Weight:      168.2 pounds BMI:     29.90 Pulse rate:   89 / minute BP sitting:   113 / 86  (right arm)  Vitals Entered By: Filomena Jungling NT II (February 17, 2010 9:40 AM) CC: GROIN PAIN AND HURTING IN LOWER PART OF BACK Is Patient Diabetic? Yes Did you bring your meter with you today? No Nutritional Status BMI of 25 - 29 = overweight  Have you ever been in a relationship where you felt threatened, hurt or afraid?No   Does patient need assistance? Functional Status Self care Ambulation Normal   Primary Care Provider:  Nelda Bucks DO  CC:  GROIN PAIN AND HURTING IN LOWER PART OF BACK.  History of Present Illness: 40 y/o woman with complicated pmh, outlined below presenting with 3 day h/o left groin pain that radiates to her back. She states the pain is worse with walking. She denies any interval h/o fevers, chills, nightsweats, abd pain, n/v, dysuria, hematuria, vaginal discharge, or h/o trauma/heavy lifting. She's had no prior episodes.  Preventive Screening-Counseling & Management  Alcohol-Tobacco     Alcohol type: at times / MIXED DRINK     Smoking Status: never     Passive Smoke Exposure: no  Caffeine-Diet-Exercise     Does Patient Exercise: no     Type of exercise: walking     Times/week: 4  Current Problems (verified): 1)  Fibromyalgia  (ICD-729.1) 2)  Foot Pain, Right  (ICD-729.5) 3)  Foot Pain, Bilateral  (ICD-729.5) 4)  Sx of Abdominal Pain, Periumbilic  (ICD-789.05) 5)  Dizziness  (ICD-780.4) 6)  Pelvic Pain, Right  (ICD-789.09) 7)  Palpitations  (ICD-785.1) 8)  Wrist Pain, Bilateral  (ICD-719.43) 9)  Vitamin D Deficiency  (ICD-268.9) 10)  Rheumatoid Arthritis  (ICD-714.0) 11)  Hyperlipidemia  (ICD-272.4) 12)  Pain in Soft Tissues of Limb  (ICD-729.5) 13)  Hearing Deficit  (ICD-389.9) 14)  Dm  (ICD-250.00) 15)   Chronic Rhinitis  (ICD-472.0) 16)  Proteinuria  (ICD-791.0) 17)  Knee Pain, Bilateral  (ICD-719.46) 18)  Depression  (ICD-311) 19)  Asthma  (ICD-493.90) 20)  Proteinuria  (ICD-791.0) 21)  Tinnitus, Right  (ICD-388.30)  Current Medications (verified): 1)  Albuterol 90 Mcg/act Aers (Albuterol) .... Inhale 2 Puffs Four Times A Day As Need For Shortness of Breath 2)  Lisinopril 2.5 Mg Tabs (Lisinopril) .... Take 1 Tablet By Mouth Once A Day 3)  Naprosyn 500 Mg Tabs (Naproxen) .... Take 1 Tablet By Mouth Two Times A Day 4)  Metformin Hcl 1000 Mg Tabs (Metformin Hcl) .... Take 1 Tablet By Mouth Two Times A Day 5)  Gabapentin 300 Mg Caps (Gabapentin) .Marland Kitchen.. 1 Tablet By Mouth At Bed Time For 1 Week, Then Take 1 Tablet Twice A Day For One Week, Then Take 1 Tablet Three Times A Day. 6)  Lorazepam 1 Mg Tabs (Lorazepam) .... Take One Tab By Mouth Up To Three Times A Day As Needed For Anxiety/panic Attack 7)  Prodigy Mini Pen Needles 31g X 5 Mm Misc (Insulin Pen Needle) .... Use To Inject Lantus Insulin Once Daily in Morning 8)  Lantus Solostar 100 Unit/ml Soln (Insulin Glargine) .... Inject 10 Units Once Daily in Morning For 3 Days Then Increase By 1 Unit Each Day Until Morning Blood Sugars 90-130 Range 9)  Accu-Chek  Aviva  Strp (Glucose Blood) .... Use To Test Blood Sugar Every Morning Before Breakfast and Other Times As Needed Up To 3 Times A Day 10)  Accu-Chek Multiclix Lancets  Misc (Lancets) .... Use To Check Blood Sugar Up To 3 Times Daily 11)  Ultram 50 Mg Tabs (Tramadol Hcl) .Marland Kitchen.. 1 Tablet By Mouth Every 4 To 6 Hours As Needed For Pain  Allergies (verified): No Known Drug Allergies  Past History:  Past Medical History: Last updated: 04/16/2009 Asthma H/o Proteinuria, last Ur microalb/cr 0.19 in 6/07 Question of connective tissue disorder seen by [facility] Rheumatoid Factor pos, Anti CCP pos in 2005 -? early presentation of RF Fibromyalgia (see Dr. Ines Bloomer note from  05/07/2006) Chronic sinusitis H/o chronic headaches H/o dizzines, neg cerebral angiogram Left foot pain, followed by [facility] Hx Childhood Hip Fracture Diabetes Obesity  Past Surgical History: Last updated: 04/16/2009 C section surgery on L ankle  Family History: Last updated: 07/24/2008 GM had DM Mother HTN Maternal GM had rheumatoid arthritis, had kidney disease, died in 41s. Pt denies that GM had deformities of her hands. No other family Hx of kidney problems.  No family history of elliptocytosis, or other hematologic disorders  Social History: Last updated: 08/16/2009 Has 6 children.  Lives in [place].  Separated from spouse. Can't work because of her chronic pain 1 mentally retarded, autistic son who is still in diapers (48 y/o in 2011)  for whom the pt is primary caregiver No smoking history  ETOH- rare No drug history  Risk Factors: Exercise: no (02/17/2010)  Risk Factors: Smoking Status: never (02/17/2010) Passive Smoke Exposure: no (02/17/2010)  Review of Systems      See HPI  Physical Exam  General:  alert and normal appearance.  alert and normal appearance.   Head:  normocephalic and atraumatic.  normocephalic and atraumatic.   Eyes:  vision grossly intact.  vision grossly intact.   Lungs:  normal respiratory effort and normal breath sounds.  normal respiratory effort and normal breath sounds.   Heart:  normal rate, regular rhythm, and no murmur.  normal rate, regular rhythm, and no murmur.   Abdomen:  soft, mild suprapubic tenderness, mild left flank tenderness to palpation. no masses and no guarding.  no masses and no guarding.   Pulses:  normal peripheral pulses Extremities:  no cyanosis or edema Neurologic:  alert & oriented X3.  alert & oriented X3.   Skin:  color normal.  color normal.   Psych:  normally interactive.  normally interactive.     Impression & Recommendations:  Problem # 1:  MUSCLE STRAIN (ICD-848.9) Her exam was  concerning for likely UTI vs nephrolithiasis, but dipstick was negative for blood, nitrites or leukocytes. Vitals are also stable. So its likely this is some type of muscle strain given the pain is exercabated with walking. She's also got a complicated pmh with chronic pain syndromes, under a lot of stress with 6 kids, including a 33 y/o with autism who is still diaper dependent for whom she is the primary care provider.  For her current complain,  will rx with Ultram with instructions to avoid strenuous activity, take the day off from work and return to the clinic if her pain worsens or develops a fever. To f/u with PCP for her other chronic issues.   Complete Medication List: 1)  Albuterol 90 Mcg/act Aers (Albuterol) .... Inhale 2 puffs four times a day as need for shortness of breath 2)  Lisinopril 2.5  Mg Tabs (Lisinopril) .... Take 1 tablet by mouth once a day 3)  Naprosyn 500 Mg Tabs (Naproxen) .... Take 1 tablet by mouth two times a day 4)  Metformin Hcl 1000 Mg Tabs (Metformin hcl) .... Take 1 tablet by mouth two times a day 5)  Gabapentin 300 Mg Caps (Gabapentin) .Marland Kitchen.. 1 tablet by mouth at bed time for 1 week, then take 1 tablet twice a day for one week, then take 1 tablet three times a day. 6)  Lorazepam 1 Mg Tabs (Lorazepam) .... Take one tab by mouth up to three times a day as needed for anxiety/panic attack 7)  Prodigy Mini Pen Needles 31g X 5 Mm Misc (Insulin pen needle) .... Use to inject lantus insulin once daily in morning 8)  Lantus Solostar 100 Unit/ml Soln (Insulin glargine) .... Inject 10 units once daily in morning for 3 days then increase by 1 unit each day until morning blood sugars 90-130 range 9)  Accu-chek Aviva Strp (Glucose blood) .... Use to test blood sugar every morning before breakfast and other times as needed up to 3 times a day 10)  Accu-chek Multiclix Lancets Misc (Lancets) .... Use to check blood sugar up to 3 times daily 11)  Ultram 50 Mg Tabs (Tramadol hcl) .Marland Kitchen.. 1  tablet by mouth every 4 to 6 hours as needed for pain  Patient Instructions: 1)  You likely have a muscle strain. Try to limit heavy lifting. 2)  Please let us know if you develop worsening pain, or develop a fever. 3)  Stay hydrated, drink lots of water. 4)  Please schedule a follow-up appointment in 3 months. Prescriptions: ULTRAM 50 MG TABS (TRAMADOL HCL) 1 tablet by mouth every 4 to 6 hours as needed for pain  #45 x 0   Entered and Authorized by:   Jaci Lazier MD   Signed by:   Jaci Lazier MD on 02/17/2010   Method used:   Print then Give to Patient   RxID:   [id number]    Orders Added: 1)  Est. Patient Level III [02725]    Prevention & Chronic Care Immunizations   Influenza vaccine: Fluvax 3+  (01/18/2010)    Tetanus booster: Not documented    Pneumococcal vaccine: Not documented  Other Screening   Pap smear: Not documented   Pap smear action/deferral: GYN Referral  (06/01/2009)    Mammogram: BI-RADS CATEGORY 1:  Negative.^MM DIGITAL DIAGNOSTIC BILAT  (01/01/2008)   Mammogram action/deferral: Ordered  (01/18/2010)   Smoking status: never  (02/17/2010)  Diabetes Mellitus   HgbA1C: 10.2  (01/18/2010)    Eye exam: Not documented    Foot exam: yes  (10/18/2009)   Foot exam action/deferral: Do today   High risk foot: Not documented   Foot care education: Done  (10/18/2009)    Urine microalbumin/creatinine ratio: 214.7  (07/24/2008)  Lipids   Total Cholesterol: 176  (06/01/2009)   LDL: 112  (06/01/2009)   LDL Direct: Not documented   HDL: 38  (06/01/2009)   Triglycerides: 128  (06/01/2009)    SGOT (AST): 19  (06/01/2009)   SGPT (ALT): 24  (06/01/2009)   Alkaline phosphatase: 87  (06/01/2009)   Total bilirubin: 0.5  (06/01/2009)  Self-Management Support :   Personal Goals (by the next clinic visit) :     Personal A1C goal: 7  (06/01/2009)     Personal blood pressure goal: 130/80  (06/01/2009)     Personal LDL goal: 100  (06/01/2009)  Patient will work on the following items until the next clinic visit to reach self-care goals:     Medications and monitoring: take my medicines every day  (02/17/2010)     Eating: eat foods that are low in salt, eat baked foods instead of fried foods  (01/18/2010)     Activity: take a 30 minute walk every day  (10/29/2009)    Diabetes self-management support: Written self-care plan  (01/18/2010)   Last diabetes self-management training by diabetes educator: 02/01/2010    Lipid self-management support: Written self-care plan  (01/18/2010)

## 2010-04-07 NOTE — Assessment & Plan Note (Signed)
Summary: Vaginal pruritis, DM, Peripheral Neuropathy   Vital Signs:  Patient profile:   40 year old female Height:      63 inches (160.02 cm) Weight:      169.4 pounds (77 kg) BMI:     30.12 Temp:     97.2 degrees F (36.22 degrees C) oral Pulse rate:   86 / minute BP sitting:   108 / 72  (right arm) Cuff size:   regular  Vitals Entered By: Theotis Barrio NT II (February 24, 2010 1:44 PM) CC: Vaginal Itching Is Patient Diabetic? Yes Did you bring your meter with you today? No Pain Assessment Patient in pain? no       Does patient need assistance? Functional Status Self care Ambulation Normal Comments ? YEAST INFECTION   Primary Care Provider:  Nelda Bucks DO  CC:  Vaginal Itching.  History of Present Illness: Pt is a 40yo Female with PMHx of uncontrolled DM (last HgA1c 10.2 in 01/2010), peripheral neuropathy, RA who presents to clinic with multiple medical concerns which are as follows:  1) Vaginal itching - patient indicates vaginal itching for 1-2 weeks without associated vaginal discharge, vaginal lesions, fevers, chills, abdominal pain, dysuria, hematuria, or malodorous urine. Has attempted douching for symptom relief, without success.   2) DM II - Patient checking blood sugars 2 times daily, preprandial AM and in afternoon.  Does not have glucometer with her today. Reports blood sugars consistently in the 300s mg/dL. Currently taking Metformin 500mg  qday and Lantus 11 units qAM (although states not taking regularly, she is skipping Lantus if she does not eat breakfast in the morning - and confirms eating breakfast fairly rarely). lNo hypoglycemic episodes since last visit.  Confirms polydipsia and polyuria. No blurring of vision, abdominal pain, nausea, or vomiting.  3) Right LE tingling, numbness - patient states worsening tingling/numbness of right foot. At times she feels like it is dragging when walking. No recent trauma, no falls. No weakness. Not taking  Gabapentin reguarly, taking on as needed.      Depression History:      The patient is having a depressed mood most of the day and has a diminished interest in her usual daily activities.         Preventive Screening-Counseling & Management  Alcohol-Tobacco     Alcohol type: at times / MIXED DRINK     Smoking Status: never     Passive Smoke Exposure: no  Caffeine-Diet-Exercise     Does Patient Exercise: no     Type of exercise: walking     Times/week: 4  Current Medications (verified): 1)  Albuterol 90 Mcg/act Aers (Albuterol) .... Inhale 2 Puffs Four Times A Day As Need For Shortness of Breath 2)  Lisinopril 2.5 Mg Tabs (Lisinopril) .... Take 1 Tablet By Mouth Once A Day 3)  Naprosyn 500 Mg Tabs (Naproxen) .... Take 1 Tablet By Mouth Two Times A Day 4)  Metformin Hcl 500 Mg Tabs (Metformin Hcl) .... Take 1 Tablet By Mouth Once A Day 5)  Gabapentin 300 Mg Caps (Gabapentin) .Marland Kitchen.. 1 Tablet By Mouth At Bed Time For 1 Week, Then Take 1 Tablet Twice A Day For One Week, Then Take 1 Tablet Three Times A Day. 6)  Lorazepam 1 Mg Tabs (Lorazepam) .... Take One Tab By Mouth Up To Three Times A Day As Needed For Anxiety/panic Attack 7)  Prodigy Mini Pen Needles 31g X 5 Mm Misc (Insulin Pen Needle) .... Use To Inject Lantus Insulin  Once Daily in Morning 8)  Lantus Solostar 100 Unit/ml Soln (Insulin Glargine) .... Inject 10 Units Once Daily in Morning For 3 Days Then Increase By 1 Unit Each Day Until Morning Blood Sugars 90-130 Range 9)  Accu-Chek Aviva  Strp (Glucose Blood) .... Use To Test Blood Sugar Every Morning Before Breakfast and Other Times As Needed Up To 3 Times A Day 10)  Accu-Chek Multiclix Lancets  Misc (Lancets) .... Use To Check Blood Sugar Up To 3 Times Daily 11)  Ultram 50 Mg Tabs (Tramadol Hcl) .Marland Kitchen.. 1 Tablet By Mouth Every 4 To 6 Hours As Needed For Pain  Allergies: No Known Drug Allergies  Review of Systems       per HPI  Physical Exam  General:  Vital signs reviewed  and noted. Well-developed,well-nourished,in no acute distress; alert,appropriate and cooperative throughout examination. Head: normocephalic, atraumatic. Neck: No deformities, masses, or tenderness noted. Lungs: Normal respiratory effort. Clear to auscultation BL without crackles or wheezes.  Heart: RRR. S1 and S2 normal without gallop, murmur, or rubs.  Abdomen: BS normoactive. Soft, Nondistended, non-tender.  No masses or organomegaly. Extremities: No pretibial edema. Sensation intact BL LE. Motor strength 5/5 BL LE, DP/ PT pulses 2+ and equal bilaterally, LE reflexes normal and equal. Pelvic Exam: creamy malodorous discharge in vaginal vault, no apparent lesions.      Impression & Recommendations:  Problem # 1:  VAGINAL PRURITUS (ICD-698.1) Assessment Deteriorated Pelvic exam, and KOH test consistent with BV, however, will await wet prep results - Check wet prep, UA, Urine chlamydia and GC - PAP performed today.  Orders: T-Urinalysis Dipstick only (16109UE) T-Chlamydia & GC Probe, Urine ([id number])  Problem # 2:  DM (ICD-250.00) Assessment: Deteriorated Poorly controlled, and patient was unaware that she needs to be taking her Lantus regularly. She does not have her glucometer with her today, therefore, unable to assess blood sugar control at home. UA showing > 1000 glucose, no ketones, trace protein. - Will instruct to take all medications regularly, instruct to bring glucometer to next office visit. - Will check CMP to evaluate renal function, will likely benefit from ACEI - If renal function and K stable, will resume her Lisinopril  Her updated medication list for this problem includes:    Lisinopril 2.5 Mg Tabs (Lisinopril) .Marland Kitchen... Take 1 tablet by mouth once a day    Metformin Hcl 500 Mg Tabs (Metformin hcl) .Marland Kitchen... Take 1 tablet by mouth once a day    Lantus Solostar 100 Unit/ml Soln (Insulin glargine) ..... Inject 10 units once daily in morning for 3 days then increase  by 1 unit each day until morning blood sugars 90-130 range  Orders: T-CMP with Estimated GFR ([id number])  Problem # 3:  PERIPHERAL NEUROPATHY (ICD-356.9) Assessment: Deteriorated Patient not taking her Gabapentin regularly, taking it on an as needed basis. Therefore, unable to assess if providing adequate therapy. Symptoms described with numbness/ tingling are most consistent with peripheral neuropathy, which would not be surprising given very poor DM control. No evidence of muscle weakness, decreased pulses, or decreased reflexes. - Recommend to resume regularly intake of Gabapentin    This case was reviewed and plan discussed with Dr. Margarito Liner.  Complete Medication List: 1)  Albuterol 90 Mcg/act Aers (Albuterol) .... Inhale 2 puffs four times a day as need for shortness of breath 2)  Lisinopril 2.5 Mg Tabs (Lisinopril) .... Take 1 tablet by mouth once a day 3)  Naprosyn 500 Mg Tabs (Naproxen) .... Take 1 tablet by  mouth two times a day 4)  Metformin Hcl 500 Mg Tabs (Metformin hcl) .... Take 1 tablet by mouth once a day 5)  Gabapentin 300 Mg Caps (Gabapentin) .Marland Kitchen.. 1 tablet by mouth at bed time for 1 week, then take 1 tablet twice a day for one week, then take 1 tablet three times a day. 6)  Lorazepam 1 Mg Tabs (Lorazepam) .... Take one tab by mouth up to three times a day as needed for anxiety/panic attack 7)  Prodigy Mini Pen Needles 31g X 5 Mm Misc (Insulin pen needle) .... Use to inject lantus insulin once daily in morning 8)  Lantus Solostar 100 Unit/ml Soln (Insulin glargine) .... Inject 10 units once daily in morning for 3 days then increase by 1 unit each day until morning blood sugars 90-130 range 9)  Accu-chek Aviva Strp (Glucose blood) .... Use to test blood sugar every morning before breakfast and other times as needed up to 3 times a day 10)  Accu-chek Multiclix Lancets Misc (Lancets) .... Use to check blood sugar up to 3 times daily 11)  Ultram 50 Mg Tabs (Tramadol hcl)  .Marland Kitchen.. 1 tablet by mouth every 4 to 6 hours as needed for pain  Other Orders: T-PAP [facility]) [phone]) T-Wet Prep by [facility] [phone])  Patient Instructions: 1)  Please follow-up at the clinic in 2 weeks, and bring your glucometer - we will recheck your diabetes at that time. Check your blood sugars before eating in the morning, and before meals. 2)  Continue taking your Metformin and Lantus regularly. 3)  Keep taking your Gabapentin regularly. 4)  I will call you if there are abnormalities in your PAP and pelvic exam results, and can call in a prescription for you. 5)  Please bring all of your medications in a bag to your next visit.  Prescriptions: METFORMIN HCL 500 MG TABS (METFORMIN HCL) Take 1 tablet by mouth once a day  #30 x 4   Entered and Authorized by:   Johnette Abraham DO   Signed by:   Johnette Abraham DO on 02/24/2010   Method used:   Print then Give to Patient   RxID:   [id number] METFORMIN HCL 500 MG TABS (METFORMIN HCL) Take 1 tablet by mouth once a day  #30 x 4   Entered and Authorized by:   Johnette Abraham DO   Signed by:   Johnette Abraham DO on 02/24/2010   Method used:   Print then Give to Patient   RxID:   [id number]    Orders Added: 1)  T-Urinalysis Dipstick only [[id number]] 2)  T-PAP [facility]) [40347] 3)  T-CMP with Estimated GFR [[id number]] 4)  T-Chlamydia & GC Probe, Urine [[id number]] 5)  T-Wet Prep by [facility] [[id number]] 6)  Est. Patient Level V [42595]   Process Orders Check Orders Results:     [facility]: ABN not required for this insurance Tests Sent for requisitioning (March 01, 2010 5:05 PM):     02/24/2010: [facility] -- T-CMP with Estimated GFR [[id number]] (signed)     02/24/2010: [facility] -- T-Chlamydia & GC Probe, Urine [[id number]] (signed)     02/24/2010: [facility] -- T-Wet Prep by [facility]  [phone] (signed)     Laboratory Results   Urine Tests  Date/Time Received: February 24, 2010 4:12 PM Date/Time Reported: Alric Quan  February 24, 2010 4:12 PM   Routine Urinalysis   Color: lt. yellow Appearance: Hazy Glucose: >=1000   (  Normal Range: Negative) Bilirubin: negative   (Normal Range: Negative) Ketone: negative   (Normal Range: Negative) Spec. Gravity: 1.015   (Normal Range: 1.003-1.035) Blood: negative   (Normal Range: Negative) pH: 6.0   (Normal Range: 5.0-8.0) Protein: trace   (Normal Range: Negative) Urobilinogen: 0.2   (Normal Range: 0-1) Nitrite: negative   (Normal Range: Negative) Leukocyte Esterace: negative   (Normal Range: Negative)

## 2010-04-07 NOTE — Progress Notes (Signed)
Summary: phone/gg  Phone Note Call from Patient   Caller: Patient Summary of Call: Pt c/o vaginal irritation since 12/22 when she was seen here and treated for BV.  She took  METRONIDAZOLE 500 MG TABS (METRONIDAZOLE) Take 1 tablet by mouth two times a day  #14 x 0  starting 2 weeks ago  but did not take 7 days in a row.  Denies any discharge  just feels irritated. do you want her seen again or do you want to treat? Initial call taken by: Merrie Roof RN,  March 15, 2010 3:37 PM  Follow-up for Phone Call        WIll send in refill for pt.  Please call her to inform her rx has been sent ([facility] [address]) and that she needs to compete the full couse of tx as directed; if she continues to experience symptoms after tx, she needs to be seen in clinic for evaluation.  Thank you! Follow-up by: Nelda Bucks DO,  March 16, 2010 8:53 AM    New/Updated Medications: METRONIDAZOLE 500 MG TABS (METRONIDAZOLE) Take 1 tablet by mouth two times a day for 14 days. Prescriptions: METRONIDAZOLE 500 MG TABS (METRONIDAZOLE) Take 1 tablet by mouth two times a day for 14 days.  #14 x 0   Entered and Authorized by:   Nelda Bucks DO   Signed by:   Nelda Bucks DO on 03/16/2010   Method used:   [facility]        [facility] [phone]* (retail)       [address]       [place], [place]  [address]       Ph: [phone] or [phone]       Fax: [phone]   RxID:   [id number]   Appended Document: phone/gg Have tried to reach pt X 3  Message for her to return call left. Pt never returned call so instructions left on phone

## 2010-05-01 ENCOUNTER — Emergency Department (HOSPITAL_COMMUNITY): Payer: Medicaid Other

## 2010-05-01 ENCOUNTER — Emergency Department (HOSPITAL_COMMUNITY)
Admission: EM | Admit: 2010-05-01 | Discharge: 2010-05-01 | Disposition: A | Discharge status: Home / Self Care | Payer: Medicaid Other | Attending: Emergency Medicine | Admitting: Emergency Medicine

## 2010-05-01 DIAGNOSIS — IMO0001 Reserved for inherently not codable concepts without codable children: Secondary | ICD-10-CM | POA: Insufficient documentation

## 2010-05-01 DIAGNOSIS — E1169 Type 2 diabetes mellitus with other specified complication: Secondary | ICD-10-CM | POA: Insufficient documentation

## 2010-05-01 DIAGNOSIS — J45909 Unspecified asthma, uncomplicated: Secondary | ICD-10-CM | POA: Insufficient documentation

## 2010-05-01 DIAGNOSIS — R0789 Other chest pain: Secondary | ICD-10-CM | POA: Insufficient documentation

## 2010-05-01 DIAGNOSIS — Z794 Long term (current) use of insulin: Secondary | ICD-10-CM | POA: Insufficient documentation

## 2010-05-01 LAB — POCT I-STAT, CHEM 8
Calcium, Ion: 1.15 mmol/L (ref 1.12–1.32)
Creatinine, Ser: 0.9 mg/dL (ref 0.4–1.2)
Glucose, Bld: 362 mg/dL — ABNORMAL HIGH (ref 70–99)
HCT: 42 % (ref 36.0–46.0)
Hemoglobin: 14.3 g/dL (ref 12.0–15.0)
Sodium: 136 mEq/L (ref 135–145)

## 2010-05-01 LAB — POCT CARDIAC MARKERS
CKMB, poc: 1.2 ng/mL (ref 1.0–8.0)
Myoglobin, poc: 48.3 ng/mL (ref 12–200)

## 2010-05-01 LAB — CBC
MCH: 23.4 pg — ABNORMAL LOW (ref 26.0–34.0)
MCHC: 33.3 g/dL (ref 30.0–36.0)
MCV: 70.3 fL — ABNORMAL LOW (ref 78.0–100.0)
Platelets: 211 10*3/uL (ref 150–400)
RDW: 14.5 % (ref 11.5–15.5)

## 2010-05-01 LAB — DIFFERENTIAL
Basophils Absolute: 0.1 10*3/uL (ref 0.0–0.1)
Basophils Relative: 1 % (ref 0–1)
Eosinophils Absolute: 0.4 10*3/uL (ref 0.0–0.7)
Lymphocytes Relative: 42 % (ref 12–46)
Lymphs Abs: 2.4 10*3/uL (ref 0.7–4.0)
Monocytes Absolute: 0.5 10*3/uL (ref 0.1–1.0)
Neutro Abs: 2.3 10*3/uL (ref 1.7–7.7)

## 2010-05-17 LAB — POCT URINALYSIS DIPSTICK
Bilirubin Urine: NEGATIVE
Ketones, ur: NEGATIVE mg/dL
Protein, ur: 100 mg/dL — AB
Specific Gravity, Urine: 1.02 (ref 1.005–1.030)
pH: 7 (ref 5.0–8.0)

## 2010-05-19 LAB — GLUCOSE, CAPILLARY
Glucose-Capillary: 202 mg/dL — ABNORMAL HIGH (ref 70–99)
Glucose-Capillary: 270 mg/dL — ABNORMAL HIGH (ref 70–99)

## 2010-05-20 ENCOUNTER — Encounter: Payer: Self-pay | Admitting: Internal Medicine

## 2010-05-22 LAB — GLUCOSE, CAPILLARY: Glucose-Capillary: 161 mg/dL — ABNORMAL HIGH (ref 70–99)

## 2010-05-23 ENCOUNTER — Ambulatory Visit (INDEPENDENT_AMBULATORY_CARE_PROVIDER_SITE_OTHER): Payer: Medicaid Other | Admitting: Internal Medicine

## 2010-05-23 ENCOUNTER — Encounter: Payer: Self-pay | Admitting: Internal Medicine

## 2010-05-23 ENCOUNTER — Telehealth: Payer: Self-pay | Admitting: *Deleted

## 2010-05-23 DIAGNOSIS — R42 Dizziness and giddiness: Secondary | ICD-10-CM

## 2010-05-23 DIAGNOSIS — E119 Type 2 diabetes mellitus without complications: Secondary | ICD-10-CM

## 2010-05-23 DIAGNOSIS — J310 Chronic rhinitis: Secondary | ICD-10-CM

## 2010-05-23 LAB — CBC
HCT: 40.7 % (ref 36.0–46.0)
Hemoglobin: 13.3 g/dL (ref 12.0–15.0)
MCHC: 32.8 g/dL (ref 30.0–36.0)
Platelets: 182 10*3/uL (ref 150–400)
RDW: 15.5 % (ref 11.5–15.5)

## 2010-05-23 LAB — BASIC METABOLIC PANEL
BUN: 6 mg/dL (ref 6–23)
CO2: 26 mEq/L (ref 19–32)
CO2: 25 mEq/L (ref 19–32)
Calcium: 9.6 mg/dL (ref 8.4–10.5)
Calcium: 9.6 mg/dL (ref 8.4–10.5)
GFR calc non Af Amer: >60 mL/min (ref >60)
Glucose, Bld: 157 mg/dL — ABNORMAL HIGH (ref 70–99)
Potassium: 3.7 mEq/L (ref 3.5–5.1)
Potassium: 4.4 mEq/L (ref 3.5–5.3)
Sodium: 138 mEq/L (ref 135–145)
Sodium: 136 mEq/L (ref 135–145)

## 2010-05-23 LAB — URINALYSIS, ROUTINE W REFLEX MICROSCOPIC
Bilirubin Urine: NEGATIVE
Hgb urine dipstick: NEGATIVE
Ketones, ur: NEGATIVE mg/dL
Nitrite: NEGATIVE
Protein, ur: 100 mg/dL — AB
Urobilinogen, UA: 0.2 mg/dL (ref 0.0–1.0)

## 2010-05-23 LAB — DIFFERENTIAL
Basophils Absolute: 0 10*3/uL (ref 0.0–0.1)
Eosinophils Absolute: 0.8 10*3/uL — ABNORMAL HIGH (ref 0.0–0.7)
Lymphs Abs: 1.6 10*3/uL (ref 0.7–4.0)
Neutro Abs: 2.1 10*3/uL (ref 1.7–7.7)

## 2010-05-23 LAB — GLUCOSE, CAPILLARY
Glucose-Capillary: 205 mg/dL — ABNORMAL HIGH (ref 70–99)
Glucose-Capillary: 291 mg/dL — ABNORMAL HIGH (ref 70–99)

## 2010-05-23 LAB — POCT PREGNANCY, URINE: Preg Test, Ur: NEGATIVE

## 2010-05-23 LAB — POCT GLYCOSYLATED HEMOGLOBIN (HGB A1C): Hemoglobin A1C: 12.5

## 2010-05-23 MED ORDER — MECLIZINE HCL 12.5 MG PO TABS: 12.5000 mg | ORAL_TABLET | Freq: Three times a day (TID) | ORAL | Status: DC | PRN

## 2010-05-23 MED ORDER — BUDESONIDE 32 MCG/ACT NA SUSP: 2.0000 | Freq: Every day | NASAL | Status: DC

## 2010-05-23 MED ORDER — FEXOFENADINE-PSEUDOEPHED ER 60-120 MG PO TB12: 1.0000 | ORAL_TABLET | Freq: Two times a day (BID) | ORAL | Status: DC

## 2010-05-23 NOTE — Telephone Encounter (Signed)
Pt called with c/o feeling dizzy for 3 days.   It is all day, room is spinning. This is not new, has had in past and resolved without treatment. Hx: DM, sugars have been elevated to 300 - 400.   Appointment give for this AM

## 2010-05-23 NOTE — Progress Notes (Signed)
  Subjective:    [name], female    DOB: 03/03/1971, 40 y.o.   MRN: [id number]  HPI  40 yr old woman with  Past Medical History  Diagnosis Date  . Peripheral neuropathy   . Fibromyalgia   . Episodic recurrent vertigo   . Vitamin D deficiency     history of  . Hyperlipidemia   . Diabetes mellitus   . Chronic rhinitis   . Depression   . Asthma   . Tinnitus     history of, Evaluated by ENT> [place]  . Rheumatoid arthritis     seen by Rheumatology in [facility], Rheumatoid Factor pos, Anti CCP pos in 2005 -? early presentation of RF  . Obesity   . History of hip fracture     hx childhood hip fracture  comes to the clinic complaining of dizziness for 3 days. Felt more in the mornings. Aggravated from going lying to standing position, and when she puts her head down or moving head from side to side. Constant. Described as the room spinning. Denies chest pain, shortness of breath.  Reports to have some sneezing that started about 1 1/2 ago. Patient slept with her air conditioner about that time as started to get symptoms. Associated with stuffy nose, and headache. Denies cough, fever/chills, abdominal, dysuria, or urgency.   Review of Systems  All other systems reviewed and are negative.       Objective:   Physical Exam  Constitutional: She is oriented to person, place, and time. She appears well-developed and well-nourished. No distress.  HENT:  Nose: Mucosal edema and rhinorrhea present.  Mouth/Throat: Oropharynx is clear and moist.  Eyes: Conjunctivae and EOM are normal. Pupils are equal, round, and reactive to light.  Neck: Normal range of motion. Neck supple.  Cardiovascular: Normal rate, regular rhythm and normal heart sounds.   Pulmonary/Chest: Effort normal and breath sounds normal.  Abdominal: Soft. Bowel sounds are normal.  Musculoskeletal: Normal range of motion.  Lymphadenopathy:    She has no cervical adenopathy.  Neurological: She is alert  and oriented to person, place, and time. No cranial nerve deficit. Coordination normal.  Skin: No rash noted. She is not diaphoretic. No erythema.  Psychiatric: She has a normal mood and affect.          Assessment & Plan:

## 2010-05-23 NOTE — Assessment & Plan Note (Signed)
Uncontrolled. Patient did not bring meter. She is currently taking 12 units of Lantus and reports that fasting blood glucose levels are around 300. Patient was instructed to increase by 1 unit each day until morning blood sugars 90-130 range. Will review meter on follow up in 2 weeks and further titrate regimen.

## 2010-05-23 NOTE — Telephone Encounter (Signed)
Sounds like vertigo..... Agree with eval when appointment available.  Reviewed patient's chart in [facility], had clinical diagnosis of vertigo around 08/2009. Furthermore has h/o tinnitus documented as well as hearing deficit under problem lists....Marland Kitchen Apparently ENT referral were done but patient did not follow  Given age, vertigo, h/o tinnitus, and h/o hearing deficit would strongly consider diagnosis of Meniere's Disease.  Patient has had adequate lab work up in recent past and also had MRI/MRA brain in 2007.    Should have audiometry ordered if not done Should be referred to ENT for Vestibular testing Can try meclizine acutely while awaiting ENT eval.

## 2010-05-23 NOTE — Patient Instructions (Addendum)
Make a follow up appointment in 2 weeks for diabetes management. Start taking Rhinocort aqua nasal spray and antivert as directed.

## 2010-05-23 NOTE — Assessment & Plan Note (Signed)
Acute on chronic rhinitis. Patient will be started on nasal glucocorticoid.

## 2010-05-23 NOTE — Assessment & Plan Note (Addendum)
Meniere's in differential. Will get records from ENT and start antivert. Follow up in two weeks.

## 2010-05-24 LAB — CBC WITH DIFFERENTIAL/PLATELET
Eosinophils Absolute: 0.3 10*3/uL (ref 0.0–0.7)
Lymphs Abs: 1.5 10*3/uL (ref 0.7–4.0)
MCH: 22.9 pg — ABNORMAL LOW (ref 26.0–34.0)
Neutro Abs: 2.5 10*3/uL (ref 1.7–7.7)
Neutrophils Relative %: 54 % (ref 43–77)
Platelets: 205 10*3/uL (ref 150–400)
RBC: 5.84 MIL/uL — ABNORMAL HIGH (ref 3.87–5.11)
WBC: 4.7 10*3/uL (ref 4.0–10.5)

## 2010-05-27 LAB — GLUCOSE, CAPILLARY: Glucose-Capillary: 185 mg/dL — ABNORMAL HIGH (ref 70–99)

## 2010-06-08 LAB — POCT URINALYSIS DIP (DEVICE)
Glucose, UA: NEGATIVE mg/dL
Nitrite: NEGATIVE
Specific Gravity, Urine: 1.02 (ref 1.005–1.030)
Urobilinogen, UA: 0.2 mg/dL (ref 0.0–1.0)

## 2010-06-08 LAB — GLUCOSE, CAPILLARY: Glucose-Capillary: 178 mg/dL — ABNORMAL HIGH (ref 70–99)

## 2010-06-09 ENCOUNTER — Ambulatory Visit (INDEPENDENT_AMBULATORY_CARE_PROVIDER_SITE_OTHER): Payer: Medicaid Other | Admitting: Internal Medicine

## 2010-06-09 ENCOUNTER — Encounter: Payer: Self-pay | Admitting: Internal Medicine

## 2010-06-09 ENCOUNTER — Telehealth: Payer: Self-pay | Admitting: *Deleted

## 2010-06-09 DIAGNOSIS — E119 Type 2 diabetes mellitus without complications: Secondary | ICD-10-CM

## 2010-06-09 DIAGNOSIS — N760 Acute vaginitis: Secondary | ICD-10-CM

## 2010-06-09 DIAGNOSIS — B9689 Other specified bacterial agents as the cause of diseases classified elsewhere: Secondary | ICD-10-CM

## 2010-06-09 DIAGNOSIS — A499 Bacterial infection, unspecified: Secondary | ICD-10-CM

## 2010-06-09 MED ORDER — METRONIDAZOLE 500 MG PO TABS: 500.0000 mg | ORAL_TABLET | Freq: Two times a day (BID) | ORAL | Status: AC

## 2010-06-09 NOTE — Telephone Encounter (Signed)
Pt called with c/o vaginal irritation.  Denies discharge but having itching. Also having pain in pelvic area.  Onset 2 weeks ago. Wants to be seen today.  Appointment at 1045

## 2010-06-09 NOTE — Progress Notes (Signed)
  Subjective:    [name], female    DOB: 1970/11/07, 40 y.o.   MRN: [id number]  HPI  40 yr old woman with c/o vaginal itching and burning, with episode similar to this previously. She has a history of diabetes mellitus in poor control, with most recent HgbA1C of 12.5. She notes she is busy-overwhelmed-since she is caring for an adult son with marked developmental delay and autism, and  working full time from 7am to 3pm. Has met with Dorothe Pea for help previously. She gets anxious about her CBG's, and forgets to eat, and then delays or forgets to take her insulin glargine-now taking 12 units SQ.     Review of Systems     Objective:   Physical Exam Vaginal wet prep shows clue cells, moderate WBC's, and no yeast      Assessment & Plan:   1. BV-Metronidazole 500 bid. No Etoh while taking and 48hrs after  2. DM-2 Will likely need more supportive advice on self-management oriented to a busy and overwhelmed individual I did encourage her today to move her Lantus dose to a full 15 units, record her CBG's at least qam try for bid, and RTC in 3 weeks.

## 2010-06-11 LAB — GLUCOSE, CAPILLARY: Glucose-Capillary: 126 mg/dL — ABNORMAL HIGH (ref 70–99)

## 2010-06-13 LAB — GLUCOSE, CAPILLARY: Glucose-Capillary: 172 mg/dL — ABNORMAL HIGH (ref 70–99)

## 2010-06-20 LAB — GLUCOSE, CAPILLARY: Glucose-Capillary: 177 mg/dL — ABNORMAL HIGH (ref 70–99)

## 2010-06-21 LAB — POCT I-STAT, CHEM 8
BUN: 8 mg/dL (ref 6–23)
Calcium, Ion: 1.2 mmol/L (ref 1.12–1.32)
HCT: 41 % (ref 36.0–46.0)
Sodium: 139 mEq/L (ref 135–145)
TCO2: 27 mmol/L (ref 0–100)

## 2010-06-21 LAB — D-DIMER, QUANTITATIVE: D-Dimer, Quant: 0.35 ug/mL-FEU (ref 0.00–0.48)

## 2010-06-21 LAB — GLUCOSE, CAPILLARY: Glucose-Capillary: 154 mg/dL — ABNORMAL HIGH (ref 70–99)

## 2010-06-21 LAB — POCT CARDIAC MARKERS: Myoglobin, poc: 109 ng/mL (ref 12–200)

## 2010-07-19 NOTE — Group Therapy Note (Signed)
Adriana Burke, Adriana Burke                ACCOUNT NO.:  [id number]   MEDICAL RECORD NO.:  [id number]           PATIENT TYPE:   LOCATION:  [facility]                     FACILITY:   PHYSICIAN:  Ginger Carne, MD DATE OF BIRTH:  1970/07/15   DATE OF SERVICE:                                  CLINIC NOTE   This patient is a 40 year old African American gravida 5, para 5-0-0-6,  African American female who has had a 1-year history of left flank and  left lower quadrant pain.  She has no genitourinary or gastrointestinal  sources for said discomfort.  The patient states that the pain has been  steady.  She does not use contraception.  Her menses are every month  lasting 4-5 days.  She has never had any gynecological problems of note.  At the moment she is recovering from ankle surgery with a wrapping of  her left ankle and precludes placement of said left ankle in stirrups  and therefore a Pap smear was deferred.  The remainder of her  examination is per her chart record.   SALIENT PHYSICAL FINDINGS:  Blood pressure 120/78.  ABDOMEN:  Soft without gross hepatosplenomegaly.  There is no evidence  of right or left CVA tenderness.  PELVIC EXAM:  Because of inability to place the patient in stirrups due  to her ankle surgery a bimanual examination was performed.  External  genitalia, vulva and vagina appeared normal.  Cervix could not be  visualized.  Uterus appears small, anteverted and flexed.  Both adnexa  are palpable with minimal tenderness on the left side and no bilateral  masses.   IMPRESSION:  Chronic left lower quadrant pain and left flank pain.   PLAN:  The patient had a normal pelvic ultrasound performed in February  2009 which was normal.  I have ordered a CT scan without contrast to  evaluate her kidney and left ureter to exclude the possibility of a  ureteral stone or renal lesion.  I have asked the patient to return in 3  weeks for followup and discussion of her  results.           ______________________________  Ginger Carne, MD     SHB/MEDQ  D:  07/17/2007  T:  07/17/2007  Job:  [id number]

## 2010-07-20 ENCOUNTER — Telehealth: Payer: Self-pay | Admitting: *Deleted

## 2010-07-20 ENCOUNTER — Ambulatory Visit (INDEPENDENT_AMBULATORY_CARE_PROVIDER_SITE_OTHER): Payer: Self-pay | Admitting: Internal Medicine

## 2010-07-20 DIAGNOSIS — N760 Acute vaginitis: Secondary | ICD-10-CM | POA: Insufficient documentation

## 2010-07-20 DIAGNOSIS — E119 Type 2 diabetes mellitus without complications: Secondary | ICD-10-CM

## 2010-07-20 DIAGNOSIS — Z139 Encounter for screening, unspecified: Secondary | ICD-10-CM

## 2010-07-20 MED ORDER — FLUCONAZOLE 150 MG PO TABS: 150.0000 mg | ORAL_TABLET | Freq: Once | ORAL | Status: AC

## 2010-07-20 NOTE — Assessment & Plan Note (Signed)
CBG in 400s today, and she says that she hasn't taken her insulin today and will take it when she goes back home. Her diabetes is really uncontrolled at this point of time and it better control. Talk with her about using her insulin regularly following diet and exercise.

## 2010-07-20 NOTE — Telephone Encounter (Signed)
She was given several week of treatment for chronic BV, now with no relief. I do not see where she has had a complete GYN eval, STD screening, or recent wet prep or visual inspection for possible HSV. The history is very suspicious for BV and she may have a refractory case or med non-compliance. Additionally she may have systemic autoimmune disease contributing to her problems-inflammatory vs. Related to hypoestrogen. May need GYN work up at this point. Schedule for appointment with any provider.

## 2010-07-20 NOTE — Telephone Encounter (Signed)
Pt scheduled for today at 1:45.

## 2010-07-20 NOTE — Assessment & Plan Note (Addendum)
She was recently diagnosed with bacterial vaginosis and got metronidazole. Today the exam shows thick white curdly discharge which was collected and sent to the lab for wet prep, but it most likely is Candida infection due to her uncontrolled diabetes with recent HbA1c of 12.5 in March. Will give her Diflucan 1 mg by mouth 1 dose today and wonders after one week. Talk with her detail about the importance of diabetes control to treat and prevent these kind of infections.

## 2010-07-20 NOTE — Patient Instructions (Addendum)
Please make a followup appointment in 4-6 weeks with primary care. Take one dose of Diflucan today and take the second dose after one week that would be next Wednesday, this is for the likely fungal infection. Please continue doing more effort in controlling your diabetes which will help prevent and treat this infection better. This take her insulin and other medications regularly as instructed and also try to follow low calorie diet and regular exercise about 30 minutes of walking daily for 5 days a week. For your breast pain, take Tylenol as needed every 4 hours, as it is most likely from muscle strain and it will take a few weeks to get better slowly. Also get your mammogram done which we will order for you in

## 2010-07-20 NOTE — Progress Notes (Signed)
  Subjective:    [name], female    DOB: 1970-11-06, 40 y.o.   MRN: [id number]  HPI Adriana Burke is a pleasant 40 year old woman with past medical history of uncontrolled diabetes mellitus type 2 with recent HbA1c 12.5 in March 2012 and recent diagnosis of bacterial vaginosis, cough and clinic with vaginal itching and and discomfort for 4to6 weeks. She was having similar itching during the last visit and was diagnosed with bacterial vaginosis and was sent home on Flagyl for 7 days, which she says she took it, but had 42 tablets and so she was still taking it intermittently. She says she has to change her undergarments to 3 times a day due to the discharge and is very stressful her and embarrassing. Denies any fever, chills, abdominal pain, urinary burning or pain, back pain, headache, nausea, vomiting, chest pain, short of breath. She is a complaints of right breast pain which started around 4 days ago suddenly. The pain is throbbing and deep, continuous, on her upper outer quadrant and radiates down to the right arm. She is a complaints of increased pain with shoulder movement. Denies any weakness or tingling, numbness in her right upper extremity.    Review of Systems    as per history of present illness Objective:   Physical Exam    Constitutional: Vital signs reviewed.  Patient is a well-developed and well-nourished in no acute distress and cooperative with exam. Alert and oriented x3.  Head: Normocephalic and atraumatic Ear: TM normal bilaterally Mouth: no erythema or exudates, MMM Eyes: PERRL, EOMI, conjunctivae normal, No scleral icterus.  Neck: Supple, Trachea midline normal ROM, No JVD, mass, thyromegaly, or carotid bruit present.  Cardiovascular: RRR, S1 normal, S2 normal, no MRG, pulses symmetric and intact bilaterally Pulmonary/Chest: CTAB, no wheezes, rales, or rhonchi Abdominal: Soft. Non-tender, non-distended, bowel sounds are normal, no masses,  organomegaly, or guarding present.  GU: no CVA tenderness Musculoskeletal: No joint deformities. Increased Pain above the right breast on overhead movement of right arm, tightness of pectoralis muscle and mild tenderness  Neurological: A&O x3, Strenght is normal and symmetric bilaterally, cranial nerve II-XII are grossly intact, no focal motor deficit, sensory intact to light touch bilaterally.  Skin: Warm, dry and intact. No rash, cyanosis, or clubbing.  Pelvic exam. Per speculum exam showing dryness of vulvar region. Pain while passing speculum, curdy white discharge seen in vaginal vault.    Assessment & Plan:

## 2010-07-20 NOTE — Telephone Encounter (Signed)
Pt called with c/o vaginal irritation, itching.  She is scratching self all the time. She was given flagyl 500 mg # 42.  She has been taking since last visit. She has 1 week left but she is getting no relief.  What else can she do?

## 2010-07-21 ENCOUNTER — Ambulatory Visit
Admission: RE | Admit: 2010-07-21 | Discharge: 2010-07-21 | Disposition: A | Discharge status: Home / Self Care | Payer: Medicaid Other | Source: Ambulatory Visit | Attending: Internal Medicine | Admitting: Internal Medicine

## 2010-07-21 DIAGNOSIS — Z139 Encounter for screening, unspecified: Secondary | ICD-10-CM

## 2010-07-21 LAB — WET PREP BY MOLECULAR PROBE: Candida species: POSITIVE — AB

## 2010-08-08 ENCOUNTER — Encounter: Payer: Medicaid Other | Admitting: Internal Medicine

## 2010-08-16 ENCOUNTER — Encounter: Payer: Medicaid Other | Admitting: Internal Medicine

## 2010-11-30 LAB — CBC
HCT: 36.7
Hemoglobin: 11.8 — ABNORMAL LOW
MCHC: 32.1
Platelets: 194
RDW: 15.8 — ABNORMAL HIGH

## 2010-11-30 LAB — POCT I-STAT, CHEM 8
BUN: 12
Calcium, Ion: 1.09 — ABNORMAL LOW
Chloride: 102
Creatinine, Ser: 1
Glucose, Bld: 240 — ABNORMAL HIGH
HCT: 40
Potassium: 2.9 — ABNORMAL LOW

## 2010-11-30 LAB — POCT CARDIAC MARKERS: CKMB, poc: 1.7

## 2010-11-30 LAB — DIFFERENTIAL
Basophils Absolute: 0
Basophils Relative: 0
Eosinophils Absolute: 0.5
Eosinophils Relative: 6 — ABNORMAL HIGH
Monocytes Absolute: 0.3

## 2010-11-30 LAB — POCT I-STAT 3, ART BLOOD GAS (G3+)
Bicarbonate: 21.9
O2 Saturation: 98
pCO2 arterial: 33.9 — ABNORMAL LOW
pO2, Arterial: 96

## 2010-11-30 LAB — D-DIMER, QUANTITATIVE: D-Dimer, Quant: 0.28

## 2010-11-30 LAB — B-NATRIURETIC PEPTIDE (CONVERTED LAB): Pro B Natriuretic peptide (BNP): <30

## 2010-12-02 LAB — POCT URINALYSIS DIP (DEVICE)
Bilirubin Urine: NEGATIVE
Bilirubin Urine: NEGATIVE
Glucose, UA: NEGATIVE
Glucose, UA: NEGATIVE
Hgb urine dipstick: NEGATIVE
Ketones, ur: NEGATIVE
Nitrite: NEGATIVE
Nitrite: NEGATIVE
Operator id: 208841
Specific Gravity, Urine: 1.025
Urobilinogen, UA: 0.2

## 2010-12-02 LAB — GC/CHLAMYDIA PROBE AMP, GENITAL
Chlamydia, DNA Probe: NEGATIVE
GC Probe Amp, Genital: NEGATIVE

## 2010-12-02 LAB — WET PREP, GENITAL: Yeast Wet Prep HPF POC: NONE SEEN

## 2010-12-05 LAB — POCT URINALYSIS DIP (DEVICE)
Bilirubin Urine: NEGATIVE
Hgb urine dipstick: NEGATIVE
Ketones, ur: NEGATIVE
pH: 6.5

## 2010-12-12 ENCOUNTER — Telehealth: Payer: Self-pay | Admitting: *Deleted

## 2010-12-12 NOTE — Telephone Encounter (Signed)
Unable to diagnose with above description over the phone.  Agree with patient's decision for sooner evaluation.

## 2010-12-12 NOTE — Telephone Encounter (Signed)
Pt called with c/o dizziness and headaches. Also having pain in chest. This is on and off, rates pain as sharp. Has pain in shoulder with numbness on and off lasting a few days.  Now this has resolved but tends to return.  Dizziness for 2 weeks, eating and drinking well.   She has nausea with some vomiting. She takes IBU with pain relief but headache returns. Hx of dizziness from dehydration.    First appointment we have is Wednesday.  Pt decided to go to [facility] now for evaluation.

## 2010-12-16 LAB — CBC
MCHC: 32.5
RBC: 5.19 — ABNORMAL HIGH
WBC: 5.8

## 2010-12-16 LAB — GC/CHLAMYDIA PROBE AMP, GENITAL
Chlamydia, DNA Probe: NEGATIVE
GC Probe Amp, Genital: NEGATIVE

## 2010-12-16 LAB — URINALYSIS, ROUTINE W REFLEX MICROSCOPIC
Glucose, UA: NEGATIVE
Protein, ur: NEGATIVE
Specific Gravity, Urine: >1.03 — ABNORMAL HIGH
pH: 6

## 2010-12-16 LAB — POCT PREGNANCY, URINE
Operator id: 12087
Preg Test, Ur: NEGATIVE

## 2010-12-16 LAB — WET PREP, GENITAL

## 2010-12-23 ENCOUNTER — Emergency Department (HOSPITAL_COMMUNITY)
Admission: EM | Admit: 2010-12-23 | Discharge: 2010-12-23 | Discharge status: Against Medical Advice | Payer: Self-pay | Attending: Emergency Medicine | Admitting: Emergency Medicine

## 2010-12-23 ENCOUNTER — Inpatient Hospital Stay (INDEPENDENT_AMBULATORY_CARE_PROVIDER_SITE_OTHER)
Admission: RE | Admit: 2010-12-23 | Discharge: 2010-12-23 | Disposition: A | Discharge status: Home / Self Care | Payer: Self-pay | Source: Ambulatory Visit | Attending: Emergency Medicine | Admitting: Emergency Medicine

## 2010-12-23 DIAGNOSIS — R110 Nausea: Secondary | ICD-10-CM | POA: Insufficient documentation

## 2010-12-23 DIAGNOSIS — R109 Unspecified abdominal pain: Secondary | ICD-10-CM

## 2010-12-23 LAB — POCT I-STAT, CHEM 8
BUN: 5 mg/dL — ABNORMAL LOW (ref 6–23)
Calcium, Ion: 1.16 mmol/L (ref 1.12–1.32)
Chloride: 96 mEq/L (ref 96–112)
Glucose, Bld: 350 mg/dL — ABNORMAL HIGH (ref 70–99)
HCT: 45 % (ref 36.0–46.0)

## 2010-12-23 LAB — POCT URINALYSIS DIP (DEVICE)
Ketones, ur: NEGATIVE mg/dL
Leukocytes, UA: NEGATIVE
Protein, ur: NEGATIVE mg/dL
pH: 6 (ref 5.0–8.0)

## 2010-12-29 ENCOUNTER — Other Ambulatory Visit: Payer: Self-pay | Admitting: Internal Medicine

## 2010-12-29 ENCOUNTER — Ambulatory Visit (HOSPITAL_COMMUNITY)
Admission: RE | Admit: 2010-12-29 | Discharge: 2010-12-29 | Disposition: A | Discharge status: Home / Self Care | Payer: Self-pay | Source: Ambulatory Visit | Attending: Internal Medicine | Admitting: Internal Medicine

## 2010-12-29 ENCOUNTER — Ambulatory Visit (INDEPENDENT_AMBULATORY_CARE_PROVIDER_SITE_OTHER): Payer: Self-pay | Admitting: Internal Medicine

## 2010-12-29 ENCOUNTER — Telehealth: Payer: Self-pay | Admitting: *Deleted

## 2010-12-29 VITALS — BP 111/82 | HR 92 | Temp 97.3°F | Ht 64.0 in | Wt 159.3 lb

## 2010-12-29 DIAGNOSIS — R110 Nausea: Secondary | ICD-10-CM | POA: Insufficient documentation

## 2010-12-29 DIAGNOSIS — R109 Unspecified abdominal pain: Secondary | ICD-10-CM

## 2010-12-29 DIAGNOSIS — E1369 Other specified diabetes mellitus with other specified complication: Secondary | ICD-10-CM

## 2010-12-29 DIAGNOSIS — R1032 Left lower quadrant pain: Secondary | ICD-10-CM | POA: Insufficient documentation

## 2010-12-29 DIAGNOSIS — Z23 Encounter for immunization: Secondary | ICD-10-CM

## 2010-12-29 DIAGNOSIS — R160 Hepatomegaly, not elsewhere classified: Secondary | ICD-10-CM | POA: Insufficient documentation

## 2010-12-29 DIAGNOSIS — R112 Nausea with vomiting, unspecified: Secondary | ICD-10-CM

## 2010-12-29 DIAGNOSIS — E0865 Diabetes mellitus due to underlying condition with hyperglycemia: Secondary | ICD-10-CM

## 2010-12-29 DIAGNOSIS — K7689 Other specified diseases of liver: Secondary | ICD-10-CM | POA: Insufficient documentation

## 2010-12-29 LAB — COMPREHENSIVE METABOLIC PANEL
AST: 19 U/L (ref 0–37)
Albumin: 3.8 g/dL (ref 3.5–5.2)
BUN: 7 mg/dL (ref 6–23)
CO2: 26 mEq/L (ref 19–32)
Calcium: 9.5 mg/dL (ref 8.4–10.5)
Chloride: 96 mEq/L (ref 96–112)
Glucose, Bld: 307 mg/dL — ABNORMAL HIGH (ref 70–99)
Potassium: 3.8 mEq/L (ref 3.5–5.3)

## 2010-12-29 LAB — POCT URINALYSIS DIPSTICK
Bilirubin, UA: NEGATIVE
Glucose, UA: >=1000
Spec Grav, UA: 1.01

## 2010-12-29 MED ORDER — IOHEXOL 300 MG/ML  SOLN
100.0000 mL | Freq: Once | INTRAMUSCULAR | Status: AC | PRN
  Administered 2010-12-29: 100 mL via INTRAVENOUS

## 2010-12-29 MED ORDER — INSULIN GLARGINE 100 UNIT/ML ~~LOC~~ SOLN: 25.0000 [IU] | Freq: Every day | SUBCUTANEOUS | Status: DC

## 2010-12-29 NOTE — Patient Instructions (Addendum)
   Please follow-up at the clinic in 1 week, at which time we will reevaluate your diabetes, pain.  Continue your lantus at 25 units, check blood sugars once daily before breakfast and bring in your glucometer next visit.  Take tylenol for your pain.   You will have a CT of your abdomen/ pelvis and labs - if it is abnormal, we will let you know.  Please follow-up in the clinic sooner if needed.  If you have been started on new medication(s), and you develop symptoms concerning for allergic reaction, including, but not limited to, throat closing, tongue swelling, rash, please stop the medication immediately and call the clinic at [phone], and go to the ER.  If you are diabetic, please bring your meter to your next visit.  If symptoms worsen, or new symptoms arise, please call the clinic or go to the ER.  Please bring all of your medications in a bag to your next visit.

## 2010-12-29 NOTE — Assessment & Plan Note (Addendum)
The etiology of the patient's acute left flank pain is unclear at this time. Preliminary UA per dipstick is negative nitrates or leukocytes or gross hematuria. This may be musculoskeletal pain for manifestation of her fibromyalgia. - Will repeat standard urinalysis with microscopy. - Will check CT abdomen and contrast to evaluate for stone I spoke with Dr. Reche Dixon in radiology who recommended this specific imaging study for further evaluation. - Results to be followed up by the on-call internal medicine teaching service physicians. - Continue Tylenol for pain management at this time. - Will be called if acute admission is required.

## 2010-12-29 NOTE — Progress Notes (Signed)
Subjective:   [name] female   DOB: [birth year] 40 y.o.   MRN: [id number]  HPI: [name] is a 40 y.o. with a PMHx of very poorly controlled DMII, fibromyalgia, HTN who presented to clinic today for the following:  1) Left flank pain - Pt indicates constant, sharp, left sided flank pain over the last 4 weeks. Denies radiation of pain, fevers, chills, hematuria, dysuria. Denies recent falls, trauma to area. Has been taking Tramadol BID (from old RX) with adequate control of symptoms. Has no history of kidney stones or prior similar episodes.   2) Nausea/ vomiting - pt indicates chronic nausea and vomiting over few years, that is acutely worse over last week, vomiting at least 1-2 times per week. No fevers, chills, abdominal pain, dysuria, hematuria, diarrhea, constipation. Has not had a prior gastric emptying study.   3) DM, II Lab Results  Component Value Date   HGBA1C >14.0 12/29/2010   Patient is not checking blood sugars. Currently supposed to be taking Lantus 25 units daily, however only taking it approximately 3 times a week. She is not very clear when asked about the barriers to her taking her Lantus therapy. She only indicates that she is very busy, sometimes forgets to take her insulin, has a lot of demands at home to take care of her son and therefore forgets to take care of herself. She denies any financial limitations to contribute toward her significant medication noncompliance. Notably the patient is no longer taking her metformin as it causes her stomach. admits to polyuria, polydipsia, nausea, vomiting, worsening of her peripheral neuropathy (is also not taking her gabapentin because it upsets her stomach).    Review of Systems: Per HPI.  Current Outpatient Medications (that she is supposed to be on): Pt is not taking any of her medications regularly.  Medication Sig  . albuterol (PROVENTIL,VENTOLIN) 90 MCG/ACT inhaler 2 puffs 4 (four) times daily as  needed. For shortness of breath.   . budesonide (RHINOCORT AQUA) 32 MCG/ACT nasal spray 2 sprays by Nasal route daily.  Marland Kitchen glucose blood (ACCU-CHEK AVIVA) test strip Use to test blood sugar every morning before breakfast and other times as needed up to 3 times a day.   . Lancets (ACCU-CHEK MULTICLIX) lancets Use to test blood sugar 3 times a day   . meclizine (ANTIVERT) 12.5 MG tablet Take 1 tablet (12.5 mg total) by mouth 3 (three) times daily as needed for dizziness or nausea.  Marland Kitchen gabapentin (NEURONTIN) 300 MG capsule Take 300 mg by mouth 3 (three) times daily.  - stopped.  . insulin glargine (LANTUS SOLOSTAR) 100 UNIT/ML injection Inject into the skin. Inject 10 units once daily in the morning for 3 days then increase by 1 unit each day until morning blood sugars 90-130 range.   . Insulin Pen Needle (PRODIGY MINI PEN NEEDLES) 31G X 5 MM MISC Use to inject lantus insulin once daily in the morning   . lisinopril (PRINIVIL,ZESTRIL) 2.5 MG tablet Take 2.5 mg by mouth daily.    . metFORMIN (GLUCOPHAGE) 500 MG tablet Take 500 mg by mouth daily.  - stopped due to stomach upset.  . naproxen (NAPROSYN) 500 MG tablet Take 500 mg by mouth 2 (two) times daily with meals.      Allergies: No Known Allergies  Past Medical History  Diagnosis Date  . Peripheral neuropathy   . Fibromyalgia   . Episodic recurrent vertigo   . Vitamin D deficiency  history of  . Hyperlipidemia   . Diabetes mellitus   . Chronic rhinitis   . Depression   . Asthma   . Tinnitus     history of, Evaluated by ENT> [place]  . Rheumatoid arthritis     seen by Rheumatology in [facility], Rheumatoid Factor pos, Anti CCP pos in 2005 -? early presentation of RF  . Obesity   . History of hip fracture     hx childhood hip fracture    Past Surgical History  Procedure Date  . Cesarean section      Objective:   Physical Exam: Filed Vitals:   12/29/10 1612  BP: 119/77  Pulse: 86  Temp: 97.3 F (36.3 C)      Orthostatic vital signs: negative   General: Vital signs reviewed and noted. Well-developed, well-nourished, in no acute distress; alert, appropriate and cooperative throughout examination.  Head: Normocephalic, atraumatic.  Lungs:  Normal respiratory effort. Clear to auscultation BL without crackles or wheezes.  Heart: RRR. S1 and S2 normal without gallop, murmur, or rubs.  Abdomen:  BS normoactive. Soft, Nondistended. No masses or organomegaly. Tenderness to palpation left upper quadrant. CVA tenderness on left.   Extremities: No pretibial edema.    Assessment & Plan:   Case and plan of care discussed with Dr. Ulyess Mort.

## 2010-12-29 NOTE — Assessment & Plan Note (Addendum)
Lab Results  Component Value Date   HGBA1C >14.0 12/29/2010   HGBA1C 12.5 05/23/2010   HGBA1C 10.2 01/18/2010   Lab Results  Component Value Date   MICROALBUR 20.31* 07/24/2008   LDLCALC 112* 06/01/2009   CREATININE 0.70 12/23/2010    Assessment:  Disease Control: not controlled, I am very concern about how quickly her diabetes has deteriorated, and the pt is alarmingly calm about her condition. Since diagnosis approximately 2 years ago, her A1c went from 7 to > 14 today. Likely many of the chronic problems that she has been experiencing are related to complications from her poor diabetic control, including her chronic nausea/vomiting, blurry vision, peripheral neuropathy. She is not compliant with any of her medications.  Progress toward goals: deteriorated  Barriers to meeting goals: pt forgets to take her insulin. Is stressed at home, states it hasn't been a priority for her. She has no financial limitations to getting the insulin per the pt. Pt is surprisingly and alarmingly calm regarding the very poor control of her diabetes.   Plan: Glucometer log was not reviewed today, as pt did not have glucometer available for review.  Diabetes treatment:     Continue current medications - instructed to take her Lantus daily without missed doses (if CBGs < 70, she should call clinic and cut back on insulin)  Reminded to bring blood glucose meter & log to each visit, reminded to bring medications to each visit and discussed diet  Instructed to check blood sugars at least once daily before breakfast, and preferably before each meal  I counseled the patient at length regarding her very poorly controlled diabetes and multiple complications that will likely result in she continues to have such probably poor control diabetes.  At this time given her nausea, vomiting, A1c of greater than 14 today I am concerned about her acute condition.  Will obtain a CMET, results to be followed up I on-call  internal medicine teaching service physicians.  CMET will be ordered to help determine if she will require admission due to a DKA versus HHS picture.   She is counseled to otherwise followup in the clinic within one week for diabetes management at which time she should also followup with Jamison Neighbor, if not acutely admitted. She is instructed to check her blood sugars at least once daily before breakfast and to bring her glucometer with her to the next clinic visit. Patient indicates she has a glucometer and all testing supplies.

## 2010-12-29 NOTE — Telephone Encounter (Signed)
Pt called and states she was seen at [facility] last week, she was sent to [facility] ED to be seen.  She was not able to wait so went home. Pt c/o abd pain for a few weeks, abd cramps, vomiting.  Pain is constant, in one spot, rates pain 6/10.  She takes tylenol without relief.  Walking makes pain worse. She is eating okay, denies fever.  Appointment given today

## 2010-12-30 ENCOUNTER — Telehealth: Payer: Self-pay | Admitting: *Deleted

## 2010-12-30 DIAGNOSIS — R112 Nausea with vomiting, unspecified: Secondary | ICD-10-CM | POA: Insufficient documentation

## 2010-12-30 LAB — URINALYSIS, ROUTINE W REFLEX MICROSCOPIC
Ketones, ur: NEGATIVE mg/dL
Leukocytes, UA: NEGATIVE
Nitrite: NEGATIVE
Specific Gravity, Urine: 1.037 — ABNORMAL HIGH (ref 1.005–1.030)
Urobilinogen, UA: 0.2 mg/dL (ref 0.0–1.0)

## 2010-12-30 LAB — URINALYSIS, MICROSCOPIC ONLY
Bacteria, UA: NONE SEEN
Crystals: NONE SEEN

## 2010-12-30 NOTE — Telephone Encounter (Signed)
Please call pt with the results of her CT scan from yesterday. # [id number]

## 2010-12-30 NOTE — Assessment & Plan Note (Signed)
Chronic nausea and vomiting with acute exacerbation. Likely gastroparesis in setting of very poorly controlled diabetes. However, pt is acutely having an exacerbation over last 1 week, she is presenting with A1c > 14, and acute flank pain, therefore, acute nausea and vomiting could be related to acute pathology causing flank pain. As well, it could represent be due to DKA or HHS depending on pts blood sugar control today.  Plan:   Check CMET  Check CT Abdomen and pelvis   Needs better diabetes control - which would likely improve many of her chronic symptoms.  If acute evaluation unremarkable, may benefit from gastric emptying study to evaluate for gastroparesis with consideration of Reglan therapy if indicated.

## 2010-12-30 NOTE — Telephone Encounter (Signed)
Pt informed no evidence of kidney stone, per Dr Coralee Pesa

## 2011-02-06 ENCOUNTER — Telehealth: Payer: Self-pay | Admitting: *Deleted

## 2011-02-06 NOTE — Telephone Encounter (Signed)
Pt calls and request appt for swelling and pain of fingers and toes, appt given for 12/4 1015 dr Tonny Branch. Per chilonb. Pt agreeable

## 2011-02-09 ENCOUNTER — Ambulatory Visit: Payer: Self-pay | Admitting: Internal Medicine

## 2011-02-20 ENCOUNTER — Other Ambulatory Visit: Payer: Self-pay

## 2011-02-20 ENCOUNTER — Emergency Department (INDEPENDENT_AMBULATORY_CARE_PROVIDER_SITE_OTHER)
Admission: EM | Admit: 2011-02-20 | Discharge: 2011-02-20 | Disposition: A | Discharge status: Home / Self Care | Payer: Self-pay | Source: Home / Self Care | Attending: Family Medicine | Admitting: Family Medicine

## 2011-02-20 ENCOUNTER — Encounter (HOSPITAL_COMMUNITY): Payer: Self-pay | Admitting: *Deleted

## 2011-02-20 DIAGNOSIS — M25572 Pain in left ankle and joints of left foot: Secondary | ICD-10-CM

## 2011-02-20 DIAGNOSIS — R51 Headache: Secondary | ICD-10-CM

## 2011-02-20 DIAGNOSIS — M25579 Pain in unspecified ankle and joints of unspecified foot: Secondary | ICD-10-CM

## 2011-02-20 DIAGNOSIS — E119 Type 2 diabetes mellitus without complications: Secondary | ICD-10-CM

## 2011-02-20 DIAGNOSIS — R0789 Other chest pain: Secondary | ICD-10-CM

## 2011-02-20 DIAGNOSIS — M25529 Pain in unspecified elbow: Secondary | ICD-10-CM

## 2011-02-20 MED ORDER — CYCLOBENZAPRINE HCL 10 MG PO TABS: 10.0000 mg | ORAL_TABLET | Freq: Three times a day (TID) | ORAL | Status: AC | PRN

## 2011-02-20 MED ORDER — INSULIN GLARGINE 100 UNIT/ML ~~LOC~~ SOLN: 30.0000 [IU] | Freq: Every day | SUBCUTANEOUS | Status: DC

## 2011-02-20 NOTE — ED Provider Notes (Signed)
Adriana Burke is a 40 year old woman well known to [facility] outpatient clinics who presents to urgent care tonight with multiple complaints. #1 headache: Present for 4 days associated with right ear pain and pain in the lateral muscle groups of her neck on the right side. She denies any vision changes syncope confusion weakness numbness loss of coordination inability to walk bowel or bladder dysfunction.  She notes that she had a workup in 2007 that included an MRI, MRA and four-vessel cerebral angiography.  She has tried Tylenol for her headache. She has not been to her primary care provider for this problem.  #2 sharp chest pain: Present for the last 2 hours she describes it as right sided sharp stabbing not associated with exertion dyspnea palpitations. She has had a workup in the past for chest pain with multiple EKGs an echocardiogram in 2011 that was normal. She denies any cough trouble breathing or leg swelling additionally,  #3 diabetes: Notes that she ran out of Lantus yesterday and that was her last dose. She takes 30 units a day. She thinks her blood sugar may be high.  #4 left ankle pain: Pain in the lateral left ankle for the last few days associated with some swelling. Her personal history is significant for ankle aspiration injection several years ago for a similar issue. She denies any trauma to that ankle and does note some  pain with walking. She denies any personal history of gout.       PMH reviewed. Significant for fibromyalgia and multiple pain complaints that have been worked up in the past ROS as above otherwise neg Medications reviewed. No current facility-administered medications for this encounter.   Current Outpatient Prescriptions  Medication Sig Dispense Refill  . albuterol (PROVENTIL,VENTOLIN) 90 MCG/ACT inhaler 2 puffs 4 (four) times daily as needed. For shortness of breath.       . insulin glargine (LANTUS SOLOSTAR) 100 UNIT/ML injection Inject 25 Units into the  skin at bedtime. Inject 10 units once daily in the morning for 3 days then increase by 1 unit each day until morning blood sugars 90-130 range.  10 mL  0  . Insulin Pen Needle (PRODIGY MINI PEN NEEDLES) 31G X 5 MM MISC Use to inject lantus insulin once daily in the morning       . Lancets (ACCU-CHEK MULTICLIX) lancets Use to test blood sugar 3 times a day       . budesonide (RHINOCORT AQUA) 32 MCG/ACT nasal spray 2 sprays by Nasal route daily.  1 Bottle  2  . cyclobenzaprine (FLEXERIL) 10 MG tablet Take 1 tablet (10 mg total) by mouth 3 (three) times daily as needed for muscle spasms.  20 tablet  0  . gabapentin (NEURONTIN) 300 MG capsule Take 300 mg by mouth 3 (three) times daily.        Marland Kitchen glucose blood (ACCU-CHEK AVIVA) test strip Use to test blood sugar every morning before breakfast and other times as needed up to 3 times a day.       . insulin glargine (LANTUS SOLOSTAR) 100 UNIT/ML injection Inject 30 Units into the skin at bedtime.  3 mL  0  . lisinopril (PRINIVIL,ZESTRIL) 2.5 MG tablet Take 2.5 mg by mouth daily.        . meclizine (ANTIVERT) 12.5 MG tablet Take 1 tablet (12.5 mg total) by mouth 3 (three) times daily as needed for dizziness or nausea.  30 tablet  1  . naproxen (NAPROSYN) 500 MG tablet Take  500 mg by mouth 2 (two) times daily with meals.          Exam:  BP 124/69  Pulse 87  Temp(Src) 98.3 F (36.8 C) (Oral)  Resp 16  SpO2 100%  LMP 02/10/2011 Gen: Well NAD HEENT: EOMI,  MMM, pupils equal reactive to light funduscopic exam is normal vision is intact  Lungs: CTABL Nl WOB Heart: RRR no MRG Abd: NABS, NT, ND Exts: Non edematous BL  LE, warm and well perfused.  Neuro: Cranial nerves II through XII are intact sensation reflexes strength coordination and gait are all normal. Negative Romberg negative pronator drift.  Patient is conversant and has normal orientation.  MSK: Left ankle slight effusion not warm to touch. Mildly tender on the lateral tarsal bones.  Negative  talar tilt and negative anterior drawer.  Gait is normal. Neck: Right lateral muscle group tenderness palpation. No spinal tenderness along midline and normal neck range of motion  Assessment and plan: 40 year old woman with multiple complaints significantly chest pain and headache.  1) headache: Has had multiple headaches like this in the past and has had a very extensive workup 5 years ago.  She has no red flag signs or symptoms tonight.  I suspect the cause of her headache is likely neck muscle spasm.  I prescribed Flexeril and encourage her to followup with her primary care provider as soon as possible. We carefully reviewed with flag signs or symptoms for worsening headache and patient expresses understanding.   2) chest pain: Nonanginal type.  Risk factors for coronary artery disease and pulmonary malaise and very low. He'll especially given multiple workups for the same complaint in the past that have been negative.  Encouraged patient to followup with her primary care provider. Additionally reviewed red flag signs or symptoms for worsening anginal type chest pain and asked patient to followup in the emergency room for these issues. She expresses understand  3) diabetes: Have refilled Lantus for tonight. Blood sugar 265  4) left ankle pain: Likely arthritis versus gout.  I recommended she follow up with her primary care provider and take ibuprofen tonight.   Clementeen Graham 02/20/11 2018

## 2011-02-20 NOTE — ED Notes (Signed)
Pt  Has  Multiple  Complaints   She  States  She  Ran out  Of  Her  Insulin  She  States  She  Has  Pain innher  l  Leg radiating  Upward  -  Pain chest  Radiating  To  Back  As  Well  As  Headache        Symptoms  X  4  Days  Pt  States  She  Lost  Her  Medicaid  And  Unable to  See  Her pcp

## 2011-02-21 ENCOUNTER — Encounter: Payer: Self-pay | Admitting: Internal Medicine

## 2011-02-21 ENCOUNTER — Ambulatory Visit (INDEPENDENT_AMBULATORY_CARE_PROVIDER_SITE_OTHER): Payer: Self-pay | Admitting: Internal Medicine

## 2011-02-21 ENCOUNTER — Ambulatory Visit (HOSPITAL_COMMUNITY)
Admission: RE | Admit: 2011-02-21 | Discharge: 2011-02-21 | Disposition: A | Discharge status: Home / Self Care | Payer: Medicaid Other | Source: Ambulatory Visit | Attending: Internal Medicine | Admitting: Internal Medicine

## 2011-02-21 DIAGNOSIS — M25562 Pain in left knee: Secondary | ICD-10-CM

## 2011-02-21 DIAGNOSIS — M25572 Pain in left ankle and joints of left foot: Secondary | ICD-10-CM

## 2011-02-21 DIAGNOSIS — M25579 Pain in unspecified ankle and joints of unspecified foot: Secondary | ICD-10-CM | POA: Insufficient documentation

## 2011-02-21 DIAGNOSIS — M25569 Pain in unspecified knee: Secondary | ICD-10-CM

## 2011-02-21 LAB — GLUCOSE, CAPILLARY: Glucose-Capillary: 316 mg/dL — ABNORMAL HIGH (ref 70–99)

## 2011-02-21 MED ORDER — TRAMADOL HCL 50 MG PO TABS: 50.0000 mg | ORAL_TABLET | Freq: Four times a day (QID) | ORAL | Status: DC | PRN

## 2011-02-21 NOTE — Progress Notes (Signed)
[name], female   DOB: [birth year], 40 y.o.   MRN: [id number]  Subjective:   [name] female   DOB: 1970/12/26 40 y.o.   MRN: [id number]  HPI: [name] is a 40 y.o. woman is here for a follow up from 02/20/11 ED visit for left ankle pain. Reports that 600 mg Ibuprofen does not relieve the pain; pain is worse with bearing weight. Pain does not radiate and is alleviated only with immobilization. tates that had a similar Sx 2 years ago and seen Dr. Fonnie Jarvis with Rheumatology (?) and then it was aspirated. Patient also c/o left knee pain 45-/in intensity without any particular pattern. Pain is described as being "burning." Denies exposure to STD's, denies any fever, chills, trauma.     Past Medical History  Diagnosis Date  . Peripheral neuropathy   . Fibromyalgia   . Episodic recurrent vertigo   . Vitamin D deficiency     history of  . Hyperlipidemia   . Diabetes mellitus   . Chronic rhinitis   . Depression   . Asthma   . Tinnitus     history of, Evaluated by ENT> [place]  . Rheumatoid arthritis     seen by Rheumatology in [facility], Rheumatoid Factor pos, Anti CCP pos in 2005 -? early presentation of RF  . Obesity   . History of hip fracture     hx childhood hip fracture   Current Outpatient Prescriptions  Medication Sig Dispense Refill  . albuterol (PROVENTIL,VENTOLIN) 90 MCG/ACT inhaler 2 puffs 4 (four) times daily as needed. For shortness of breath.       . budesonide (RHINOCORT AQUA) 32 MCG/ACT nasal spray 2 sprays by Nasal route daily.  1 Bottle  2  . cyclobenzaprine (FLEXERIL) 10 MG tablet Take 1 tablet (10 mg total) by mouth 3 (three) times daily as needed for muscle spasms.  20 tablet  0  . gabapentin (NEURONTIN) 300 MG capsule Take 300 mg by mouth 3 (three) times daily.        Marland Kitchen glucose blood (ACCU-CHEK AVIVA) test strip Use to test blood sugar every morning before breakfast and other times as needed up to 3 times a day.       .  insulin glargine (LANTUS SOLOSTAR) 100 UNIT/ML injection Inject 25 Units into the skin at bedtime. Inject 10 units once daily in the morning for 3 days then increase by 1 unit each day until morning blood sugars 90-130 range.  10 mL  0  . insulin glargine (LANTUS SOLOSTAR) 100 UNIT/ML injection Inject 30 Units into the skin at bedtime.  3 mL  0  . Insulin Pen Needle (PRODIGY MINI PEN NEEDLES) 31G X 5 MM MISC Use to inject lantus insulin once daily in the morning       . Lancets (ACCU-CHEK MULTICLIX) lancets Use to test blood sugar 3 times a day       . lisinopril (PRINIVIL,ZESTRIL) 2.5 MG tablet Take 2.5 mg by mouth daily.        . meclizine (ANTIVERT) 12.5 MG tablet Take 1 tablet (12.5 mg total) by mouth 3 (three) times daily as needed for dizziness or nausea.  30 tablet  1  . naproxen (NAPROSYN) 500 MG tablet Take 500 mg by mouth 2 (two) times daily with meals.        . traMADol (ULTRAM) 50 MG tablet Take 1 tablet (50 mg total) by mouth every 6 (six) hours as needed  for pain. Maximum dose= 8 tablets per day  30 tablet  0   Family History  Problem Relation Age of Onset  . Hypertension Mother   . Diabetes Maternal Grandmother   . Arthritis Maternal Grandmother   . Kidney disease Maternal Grandmother    History   Social History  . Marital Status: Legally Separated    Spouse Name: N/A    Number of Children: N/A  . Years of Education: N/A   Social History Main Topics  . Smoking status: Never Smoker   . Smokeless tobacco: None  . Alcohol Use: Yes     ccasionally  . Drug Use: No  . Sexually Active: None   Other Topics Concern  . None   Social History Narrative   [phone]   Review of Systems: Constitutional: Denies fever, chills, diaphoresis, appetite change and fatigue.  HEENT: Denies photophobia, eye pain, redness, hearing loss, ear pain, congestion, sore throat, rhinorrhea, sneezing, mouth sores, trouble swallowing, neck pain, neck stiffness and tinnitus.   Respiratory: Denies  SOB, DOE, cough, chest tightness,  and wheezing.   Cardiovascular: Denies chest pain, palpitations and leg swelling.  Gastrointestinal: Denies nausea, vomiting, abdominal pain, diarrhea, constipation, blood in stool and abdominal distention.  Genitourinary: Denies dysuria, urgency, frequency, hematuria, flank pain and difficulty urinating.  Musculoskeletal: Denies myalgias, back pain, joint swelling, arthralgias and gait problem.  Skin: Denies pallor, rash and wound.  Neurological: Denies dizziness, seizures, syncope, weakness, light-headedness, numbness and headaches.  Hematological: Denies adenopathy. Easy bruising, personal or family bleeding history  Psychiatric/Behavioral: Denies suicidal ideation, mood changes, confusion, nervousness, sleep disturbance and agitation  Objective:  Physical Exam: Filed Vitals:   02/21/11 1358  BP: 110/73  Pulse: 87  Temp: 97.4 F (36.3 C)  TempSrc: Oral  Height: 5\' 4"  (1.626 m)  Weight: 161 lb 14.4 oz (73.437 kg)   Constitutional: Vital signs reviewed.  Patient is a well-developed and well-nourished woman in no acute distress and cooperative with exam. Alert and oriented x3.  Head: Normocephalic and atraumatic Ear: TM normal bilaterally Mouth: no erythema or exudates, MMM Eyes: PERRL, EOMI, conjunctivae normal, No scleral icterus.  Neck: Supple, Trachea midline normal ROM, No JVD, mass, thyromegaly, or carotid bruit present.  Cardiovascular: RRR, S1 normal, S2 normal, no MRG, pulses symmetric and intact bilaterally Pulmonary/Chest: CTAB, no wheezes, rales, or rhonchi Abdominal: Soft. Non-tender, non-distended, bowel sounds are normal, no masses, organomegaly, or guarding present.  GU: no CVA tenderness Musculoskeletal: Left ankle with TTP and dorsiflexion; lateral aspect with a tender but nonerythematous 1 cm, mobile nodule; no increase in warmth; no crepitus, or skin discoloration/rashes. Left kneewith mild crepitus  And tenderness in a medial  infrapatellar region with extension; otherwise with FROM. Hematology: no cervical, inginal, or axillary adenopathy.  Neurological: A&O x3, Strenght is normal and symmetric bilaterally, cranial nerve II-XII are grossly intact, no focal motor deficit, sensory intact to light touch bilaterally.  Skin: Warm, dry and intact. No rash, cyanosis, or clubbing.  Psychiatric: Normal mood and affect. speech and behavior is normal. Judgment and thought content normal. Cognition and memory are normal.   Assessment & Plan:   1. Left ankle pain, likely r/t a ganglion cyst. -Will need to obtain an Xray in order to evaluate for hair line fractures, DJD, and further evaluate the nodule. -continue with Motrin 600 mg PO q 6 hrs PRN with meals.  2. Left knee pain. DJD vs baker cyst? -knee strengthening exercises -Add Tramadol (cautioned of sedation, seizures and addiction) -Xray  to evaluate for DJD/lesions

## 2011-02-21 NOTE — Patient Instructions (Signed)
Please, have your left knee and ankle Xray-ed and return back to clinic. Please, fill in a prescription for a tramadol. Please, call with any questions or concerns. Please, make an appointment with Georges Mouse who may help you with your medicaid application.

## 2011-02-22 ENCOUNTER — Telehealth: Payer: Self-pay | Admitting: *Deleted

## 2011-02-22 NOTE — Telephone Encounter (Signed)
Pt calls and states she wants you to call her about the xrays and her pain at [phone]

## 2011-03-28 ENCOUNTER — Telehealth: Payer: Self-pay | Admitting: *Deleted

## 2011-03-28 NOTE — Telephone Encounter (Signed)
Pt calls and c/o hip pain, 8/10, states it is more so on L side, states she can hardly walk, denies any falls or injuries causing. appt 1/23 at 1045 dr Loistine Chance per sharonb.

## 2011-03-29 ENCOUNTER — Ambulatory Visit (INDEPENDENT_AMBULATORY_CARE_PROVIDER_SITE_OTHER): Payer: Self-pay | Admitting: Internal Medicine

## 2011-03-29 ENCOUNTER — Encounter: Payer: Self-pay | Admitting: Internal Medicine

## 2011-03-29 VITALS — BP 116/79 | HR 88 | Temp 97.1°F | Ht 65.0 in | Wt 162.6 lb

## 2011-03-29 DIAGNOSIS — E559 Vitamin D deficiency, unspecified: Secondary | ICD-10-CM

## 2011-03-29 DIAGNOSIS — R109 Unspecified abdominal pain: Secondary | ICD-10-CM

## 2011-03-29 DIAGNOSIS — E1142 Type 2 diabetes mellitus with diabetic polyneuropathy: Secondary | ICD-10-CM

## 2011-03-29 DIAGNOSIS — I10 Essential (primary) hypertension: Secondary | ICD-10-CM

## 2011-03-29 DIAGNOSIS — Z23 Encounter for immunization: Secondary | ICD-10-CM

## 2011-03-29 DIAGNOSIS — IMO0002 Reserved for concepts with insufficient information to code with codable children: Secondary | ICD-10-CM

## 2011-03-29 DIAGNOSIS — M25559 Pain in unspecified hip: Secondary | ICD-10-CM

## 2011-03-29 DIAGNOSIS — G8929 Other chronic pain: Secondary | ICD-10-CM

## 2011-03-29 DIAGNOSIS — E1149 Type 2 diabetes mellitus with other diabetic neurological complication: Secondary | ICD-10-CM

## 2011-03-29 DIAGNOSIS — R52 Pain, unspecified: Secondary | ICD-10-CM

## 2011-03-29 DIAGNOSIS — E1143 Type 2 diabetes mellitus with diabetic autonomic (poly)neuropathy: Secondary | ICD-10-CM

## 2011-03-29 DIAGNOSIS — E785 Hyperlipidemia, unspecified: Secondary | ICD-10-CM

## 2011-03-29 LAB — GLUCOSE, CAPILLARY

## 2011-03-29 LAB — POCT GLYCOSYLATED HEMOGLOBIN (HGB A1C): Hemoglobin A1C: >14

## 2011-03-29 MED ORDER — KETOROLAC TROMETHAMINE 60 MG/2ML IM SOLN: 60.0000 mg | Freq: Once | INTRAMUSCULAR | Status: DC

## 2011-03-29 MED ORDER — INSULIN GLARGINE 100 UNIT/ML ~~LOC~~ SOLN: SUBCUTANEOUS | Status: DC

## 2011-03-29 MED ORDER — KETOROLAC TROMETHAMINE 60 MG/2ML IM SOLN
60.0000 mg | Freq: Once | INTRAMUSCULAR | Status: AC
  Administered 2011-03-29: 60 mg via INTRAMUSCULAR

## 2011-03-29 MED ORDER — TETANUS-DIPHTH-ACELL PERTUSSIS 5-2.5-18.5 LF-MCG/0.5 IM SUSP: 0.5000 mL | Freq: Once | INTRAMUSCULAR | Status: DC

## 2011-03-29 MED ORDER — MELOXICAM 15 MG PO TABS: 15.0000 mg | ORAL_TABLET | Freq: Every day | ORAL | Status: DC

## 2011-03-29 MED ORDER — PNEUMOCOCCAL VAC POLYVALENT 25 MCG/0.5ML IJ INJ: 0.5000 mL | INJECTION | INTRAMUSCULAR | Status: AC

## 2011-03-29 NOTE — Patient Instructions (Signed)
Follow up with Kara Mead !!!!! Take your medication as prescribed.

## 2011-03-29 NOTE — Progress Notes (Signed)
Subjective:   [name] female   DOB: 01-10-1971 41 y.o.   MRN: [id number]  HPI: [name] is a 41 y.o. female with PMH significant as outlined below who presented to the clinic for left hip pain. Patient reports that it started one week ago and is not improving . She has not taking anything . Patient reports the pain is present most of the time , worse with ambulation but also present at rest. Not moving her legs provides some relieve. She further noted weakness and numbness, trouble walking but denies any rashes, redness, swelling or injury. Patient has history of joint pain in the past and has been extensively at [facility] and [facility].     Past Medical History  Diagnosis Date  . Peripheral neuropathy   . Fibromyalgia   . Episodic recurrent vertigo   . Vitamin D deficiency     history of  . Hyperlipidemia   . Diabetes mellitus   . Chronic rhinitis   . Depression   . Asthma   . Tinnitus     history of, Evaluated by ENT> [place]  . Rheumatoid arthritis     seen by Rheumatology in [facility], Rheumatoid Factor pos, Anti CCP pos in 2005 -? early presentation of RF  . Obesity   . History of hip fracture     hx childhood hip fracture   Current Outpatient Prescriptions  Medication Sig Dispense Refill  . albuterol (PROVENTIL,VENTOLIN) 90 MCG/ACT inhaler 2 puffs 4 (four) times daily as needed. For shortness of breath.       . budesonide (RHINOCORT AQUA) 32 MCG/ACT nasal spray 2 sprays by Nasal route daily.  1 Bottle  2  . insulin glargine (LANTUS SOLOSTAR) 100 UNIT/ML injection Inject 25 Units into the skin at bedtime. Inject 10 units once daily in the morning for 3 days then increase by 1 unit each day until morning blood sugars 90-130 range.  10 mL  0  . insulin glargine (LANTUS SOLOSTAR) 100 UNIT/ML injection Inject 30 Units into the skin at bedtime.  3 mL  0  . Insulin Pen Needle (PRODIGY MINI PEN NEEDLES) 31G X 5 MM MISC Use to inject lantus  insulin once daily in the morning       . Lancets (ACCU-CHEK MULTICLIX) lancets Use to test blood sugar 3 times a day        Family History  Problem Relation Age of Onset  . Hypertension Mother   . Diabetes Maternal Grandmother   . Arthritis Maternal Grandmother   . Kidney disease Maternal Grandmother    History   Social History  . Marital Status: Legally Separated    Spouse Name: N/A    Number of Children: N/A  . Years of Education: N/A   Social History Main Topics  . Smoking status: Never Smoker   . Smokeless tobacco: None  . Alcohol Use: Yes     ccasionally  . Drug Use: No  . Sexually Active: None   Other Topics Concern  . None   Social History Narrative   [phone]   Review of Systems: Constitutional: Denies fever, chills, diaphoresis, appetite change and fatigue.  Respiratory: Denies SOB, DOE, cough, chest tightness,  and wheezing.   Cardiovascular: Denies chest pain, palpitations Gastrointestinal: Denies nausea, vomiting, abdominal pain, diarrhea, constipation,  Genitourinary: Denies dysuria, urgency, frequency, hematuria, flank pain and difficulty urinating.  Musculoskeletal: Noted  myalgias, back pain, arthralgias and gait problem.  Skin: Denies pallor, rash  and wound.  Neurological: Denies dizziness, Hematological: Denies adenopathy.   Objective:  Physical Exam: Filed Vitals:   03/29/11 1059  BP: 116/79  Pulse: 88  Temp: 97.1 F (36.2 C)  TempSrc: Oral  Height: 5\' 5"  (1.651 m)  Weight: 162 lb 9.6 oz (73.755 kg)  SpO2: 100%   Constitutional: Vital signs reviewed.  Patient is a well-developed and well-nourished  in no acute distress and cooperative with exam. Alert and oriented x3.  Mouth: no erythema or exudates, MMM Eyes: PERRL, EOMI, conjunctivae normal, No scleral icterus.  Neck: Supple,  Cardiovascular: RRR, S1 normal, S2 normal,  pulses symmetric and intact bilaterally Pulmonary/Chest: CTAB, no wheezes, rales, or rhonchi Abdominal: Soft.  Non-tender, non-distended, bowel sounds are normal,  GU: no CVA tenderness Musculoskeletal: Left Hip joint: Tenderness on palpation of pelvis, Gluteus maximus, femur. Initially ROM decreased due to pain but if distracted ROM normal.  No joint deformities, erythema, or stiffness. Neurological: A&O x3, Strenght is normal and symmetric bilaterally, sensory intact to light touch bilaterally.  Skin: Warm, dry and intact. No rash, cyanosis, or clubbing.

## 2011-03-30 LAB — LIPID PANEL
LDL Cholesterol: 98 mg/dL (ref 0–99)
Total CHOL/HDL Ratio: 4.9 Ratio
Triglycerides: 224 mg/dL — ABNORMAL HIGH (ref <150)
VLDL: 45 mg/dL — ABNORMAL HIGH (ref 0–40)

## 2011-03-30 LAB — BASIC METABOLIC PANEL
CO2: 26 mEq/L (ref 19–32)
Calcium: 8.9 mg/dL (ref 8.4–10.5)
Chloride: 95 mEq/L — ABNORMAL LOW (ref 96–112)
Creat: 0.85 mg/dL (ref 0.50–1.10)
Sodium: 133 mEq/L — ABNORMAL LOW (ref 135–145)

## 2011-04-01 DIAGNOSIS — G8929 Other chronic pain: Secondary | ICD-10-CM | POA: Insufficient documentation

## 2011-04-01 MED ORDER — INSULIN GLARGINE 100 UNIT/ML ~~LOC~~ SOLN: 40.0000 [IU] | Freq: Every day | SUBCUTANEOUS | Status: DC

## 2011-04-01 NOTE — Assessment & Plan Note (Signed)
Patient was on Lisinopril but has not been taking it since 1 month . Blood pressure well controlled off medication. She would likely benefit from ACE in setting of uncontrolled DM but since patient seems to have significant financial restrain I will d/c it . Consider to restart if patient to afford it or BP elevated. BP Readings from Last 3 Encounters:  03/29/11 116/79  02/21/11 110/73  02/20/11 124/69

## 2011-04-01 NOTE — Assessment & Plan Note (Signed)
Currently not in therapy. Consider to recheck level during next office visit.

## 2011-04-01 NOTE — Assessment & Plan Note (Signed)
Patient currently on no medication. I will obtain Lipid panel today .  Update: LDL at goal.

## 2011-04-01 NOTE — Assessment & Plan Note (Addendum)
Patient was evaluated today for sever left hip pain. Patient was given a Toradol shot in the clinic and Mobic prescription. I did not provide her prescription for flexeril at this point since patient has financial restrain and I wanted to have her buy the Insulin first. Due to time issues I did not address it further. After reviewing he chart patient had been evaluated in the past extensively by Rheumatology. As soon as patient will obtain her [employer] I would refer patient back. If not I would give a trial of PT and exercise for possible myofascial pain syndrome per [facility] .

## 2011-04-01 NOTE — Assessment & Plan Note (Addendum)
Patient continues to have uncontrolled DM with Hgb A1c of >14 which has not changed since 3 month ago Likely due to non-compliance. Patient reports not taking her medication everyday to save money since she is not able to afford her medication. Has not applied for [employer]. I underlined the importance to control to take her medication on a regular basis. I proved a sample of Lantus and recommended to take 40 units daily which is an increase from 30 units which the patient was taking. Although I recommended to check her CBG on a regular basis and bring in her meter during next office visit I unfortunately doubt  That she will do it. Further provided information about what documents are needed to apply for [facility].  I will obtain a  Stat Bmet since CBG above 500.  Update: Bmet : Glucose 391. No anion gab.

## 2011-04-13 ENCOUNTER — Ambulatory Visit (INDEPENDENT_AMBULATORY_CARE_PROVIDER_SITE_OTHER): Payer: Self-pay | Admitting: Internal Medicine

## 2011-04-13 ENCOUNTER — Encounter: Payer: Self-pay | Admitting: Internal Medicine

## 2011-04-13 VITALS — BP 113/75 | HR 83 | Temp 97.0°F | Ht 65.0 in | Wt 162.2 lb

## 2011-04-13 DIAGNOSIS — E1143 Type 2 diabetes mellitus with diabetic autonomic (poly)neuropathy: Secondary | ICD-10-CM

## 2011-04-13 DIAGNOSIS — R52 Pain, unspecified: Secondary | ICD-10-CM

## 2011-04-13 DIAGNOSIS — M25552 Pain in left hip: Secondary | ICD-10-CM

## 2011-04-13 DIAGNOSIS — G909 Disorder of the autonomic nervous system, unspecified: Secondary | ICD-10-CM

## 2011-04-13 DIAGNOSIS — G8929 Other chronic pain: Secondary | ICD-10-CM

## 2011-04-13 DIAGNOSIS — R441 Visual hallucinations: Secondary | ICD-10-CM

## 2011-04-13 DIAGNOSIS — E1149 Type 2 diabetes mellitus with other diabetic neurological complication: Secondary | ICD-10-CM

## 2011-04-13 DIAGNOSIS — IMO0002 Reserved for concepts with insufficient information to code with codable children: Secondary | ICD-10-CM

## 2011-04-13 DIAGNOSIS — H5316 Psychophysical visual disturbances: Secondary | ICD-10-CM

## 2011-04-13 NOTE — Assessment & Plan Note (Signed)
She reports seeing spiders on the wall for last 1 month that usually lasts for less than a minute. Differentials for visual hallucinations include retinal pathology ( traction, detachment ) verus visual pathway involvement ( optic tract , nerve or chiasm) from diseases like glaucoma, cataract verus drugs versus metabolic causes verus psychiatric illness. Due to poorlly controlled diabetes , retinal  pathology is possible but with the kind of description of hallucinations underlying psychiatric illness appears more likely. - She was advised to go to the [facility] for further evaluation. - Will also refer her for opthalmology exam once she gets insurance.

## 2011-04-13 NOTE — Assessment & Plan Note (Signed)
Patient was again seen in the clinic for severe left pain.  On my exam today patient is tender to palpation on left hip and has some limited internal and external rotation. Some great work on chart review was done by Dr. Loistine Chance which was very helpful.! Given her positive rheumatoid factor and CCP antibody in the past, she could have joint involvement from inflammatory arthritis but the notes from [facility] does not support that. Other differentials include bursitis versus degenerative arthritis. - We'll first obtain x-rays of hip. - Continue Mobic for pain control. - She was advised to come to the clinic after the x-rays to discuss the report but she had to take care of her autistic child and was not able to come. She was advised to follow up in 2 weeks.  If something abnormal will be seen on her x-rays, I will call her sooner than that

## 2011-04-13 NOTE — Assessment & Plan Note (Signed)
She was not taking the right dose of insulin. She was instructed to take 40 units of insulin subcutaneously daily. She states that she is trying to get Medicaid and after that she would be able to afford her insulin.  We may consider giving her some hypoglycemics as well if cost seems to be an issue for getting insulin.

## 2011-04-13 NOTE — Progress Notes (Signed)
  Subjective:    [name], female    DOB: 08/17/1970, 41 y.o.   MRN: [id number]  HPI 41 year old woman with past medical history significant for poorly controlled type 2 diabetes mellitus comes to the clinic for left hip pain.  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  1) Left hip pain: Patient was seen for the similar complaint in the clinic about 2 weeks ago. She states that she has been having hip pain for last 1 month and has not gotten any better over this period of time. She denies any injury but states that she had some kind of surgery on the left hip as a  Child.  Reports ome subjective weakness but denies any numbness.  2) Diabetes: She has a history of poorly controlled diabetes and medication noncompliance. She states that she has been trying to take insulin regularly since her last clinic visit. Although, she states that she takes 30 units daily though ,our  records say that she needs to take 40 units daily.  3) Visual hallucinations: She states that she has been seen some spiders on the wall occasionally, though she knows that they're not there. Denies any drug abuse, or auditory hallucinations         Review of Systems  Constitutional: Negative for diaphoresis and fatigue.  HENT: Negative for congestion, rhinorrhea and postnasal drip.   Respiratory: Negative for cough, shortness of breath and wheezing.   Cardiovascular: Negative for chest pain, palpitations and leg swelling.  Gastrointestinal: Negative for nausea, vomiting and abdominal pain.  Musculoskeletal: Negative for arthralgias.  Neurological: Negative for dizziness, facial asymmetry, light-headedness and headaches.  Hematological: Negative for adenopathy.  Psychiatric/Behavioral: Positive for hallucinations.       Objective:   Physical Exam  Constitutional: She is oriented to person, place, and time. She appears well-developed and  well-nourished. No distress.  HENT:  Head: Normocephalic and atraumatic.  Mouth/Throat: No oropharyngeal exudate.  Neck: Normal range of motion. Neck supple. No JVD present. No tracheal deviation present. No thyromegaly present.  Cardiovascular: Normal rate and regular rhythm.  Exam reveals no gallop and no friction rub.   No murmur heard. Pulmonary/Chest: Effort normal and breath sounds normal. No stridor. No respiratory distress. She has no wheezes. She has no rales. She exhibits no tenderness.  Abdominal: Soft. Bowel sounds are normal. She exhibits no distension. There is no tenderness. There is no rebound and no guarding.  Musculoskeletal:       Tender to palpation around hip joint, painful internal and external rotation  Lymphadenopathy:    She has no cervical adenopathy.  Neurological: She is alert and oriented to person, place, and time. She has normal reflexes. No cranial nerve deficit.  Skin: Skin is warm. She is not diaphoretic.          Assessment & Plan:

## 2011-04-13 NOTE — Patient Instructions (Signed)
Please schedule a follow up visit in 2- 3 weeks. I will call you with the results if anything will be abnormal.

## 2011-04-14 ENCOUNTER — Ambulatory Visit (HOSPITAL_COMMUNITY)
Admission: RE | Admit: 2011-04-14 | Discharge: 2011-04-14 | Disposition: A | Discharge status: Home / Self Care | Payer: Medicaid Other | Source: Ambulatory Visit | Attending: Internal Medicine | Admitting: Internal Medicine

## 2011-04-14 DIAGNOSIS — M25559 Pain in unspecified hip: Secondary | ICD-10-CM | POA: Insufficient documentation

## 2011-04-14 DIAGNOSIS — M1610 Unilateral primary osteoarthritis, unspecified hip: Secondary | ICD-10-CM | POA: Insufficient documentation

## 2011-04-14 DIAGNOSIS — M169 Osteoarthritis of hip, unspecified: Secondary | ICD-10-CM | POA: Insufficient documentation

## 2011-04-20 ENCOUNTER — Telehealth: Payer: Self-pay | Admitting: *Deleted

## 2011-04-20 NOTE — Telephone Encounter (Signed)
I did call the patient to discuss her X- ray results. She was explained about the degenerative changes that were noticed in her hip.  She still continues to complain of lot of hip pain.  Interestingly, to note she has different evaluations from two rheumatologist. I will try to read and review her records again.  Plan; Continue anti- inflammatory meds. Physical therapy referral once she has orange card.

## 2011-04-20 NOTE — Telephone Encounter (Signed)
It looks like she saw Dr. Dorthula Rue at her last visit and was told to come in for 2 week follow up to review results.  I will fwd this to Megha.  Thanks!

## 2011-04-20 NOTE — Telephone Encounter (Signed)
Pt calls and would like md to call her with results of xrays from last office visit, ph [phone]

## 2011-05-15 ENCOUNTER — Other Ambulatory Visit: Payer: Self-pay | Admitting: *Deleted

## 2011-05-15 MED ORDER — GLUCOSE BLOOD VI STRP: ORAL_STRIP | Status: DC

## 2011-05-31 ENCOUNTER — Encounter: Payer: Self-pay | Admitting: Internal Medicine

## 2011-05-31 ENCOUNTER — Ambulatory Visit (INDEPENDENT_AMBULATORY_CARE_PROVIDER_SITE_OTHER): Payer: Medicaid Other | Admitting: Internal Medicine

## 2011-05-31 ENCOUNTER — Other Ambulatory Visit: Payer: Self-pay | Admitting: Internal Medicine

## 2011-05-31 VITALS — BP 108/72 | HR 88 | Temp 97.3°F | Resp 20 | Ht 62.0 in | Wt 163.2 lb

## 2011-05-31 DIAGNOSIS — M1612 Unilateral primary osteoarthritis, left hip: Secondary | ICD-10-CM | POA: Insufficient documentation

## 2011-05-31 DIAGNOSIS — M069 Rheumatoid arthritis, unspecified: Secondary | ICD-10-CM

## 2011-05-31 DIAGNOSIS — E1149 Type 2 diabetes mellitus with other diabetic neurological complication: Secondary | ICD-10-CM

## 2011-05-31 DIAGNOSIS — E1142 Type 2 diabetes mellitus with diabetic polyneuropathy: Secondary | ICD-10-CM

## 2011-05-31 DIAGNOSIS — G909 Disorder of the autonomic nervous system, unspecified: Secondary | ICD-10-CM

## 2011-05-31 DIAGNOSIS — G629 Polyneuropathy, unspecified: Secondary | ICD-10-CM

## 2011-05-31 DIAGNOSIS — D464 Refractory anemia, unspecified: Secondary | ICD-10-CM

## 2011-05-31 DIAGNOSIS — M159 Polyosteoarthritis, unspecified: Secondary | ICD-10-CM

## 2011-05-31 DIAGNOSIS — E1143 Type 2 diabetes mellitus with diabetic autonomic (poly)neuropathy: Secondary | ICD-10-CM

## 2011-05-31 DIAGNOSIS — IMO0002 Reserved for concepts with insufficient information to code with codable children: Secondary | ICD-10-CM

## 2011-05-31 DIAGNOSIS — D4620 Refractory anemia with excess of blasts, unspecified: Secondary | ICD-10-CM

## 2011-05-31 DIAGNOSIS — G609 Hereditary and idiopathic neuropathy, unspecified: Secondary | ICD-10-CM

## 2011-05-31 LAB — GLUCOSE, CAPILLARY: Glucose-Capillary: 300 mg/dL — ABNORMAL HIGH (ref 70–99)

## 2011-05-31 MED ORDER — DICLOFENAC SODIUM 50 MG PO TBEC: 50.0000 mg | DELAYED_RELEASE_TABLET | Freq: Two times a day (BID) | ORAL | Status: DC

## 2011-05-31 MED ORDER — ALBUTEROL SULFATE HFA 108 (90 BASE) MCG/ACT IN AERS: 2.0000 | INHALATION_SPRAY | Freq: Four times a day (QID) | RESPIRATORY_TRACT | Status: DC | PRN

## 2011-05-31 MED ORDER — GLUCOSE BLOOD VI STRP: ORAL_STRIP | Status: DC

## 2011-05-31 MED ORDER — INSULIN PEN NEEDLE 31G X 5 MM MISC: Status: DC

## 2011-05-31 MED ORDER — INSULIN GLARGINE 100 UNIT/ML ~~LOC~~ SOLN: 40.0000 [IU] | Freq: Every day | SUBCUTANEOUS | Status: DC

## 2011-05-31 MED ORDER — GLUCOSE BLOOD VI STRP: 1.0000 | ORAL_STRIP | Freq: Three times a day (TID) | Status: DC

## 2011-05-31 MED ORDER — AMITRIPTYLINE HCL 50 MG PO TABS: 50.0000 mg | ORAL_TABLET | Freq: Every day | ORAL | Status: DC

## 2011-05-31 NOTE — Assessment & Plan Note (Signed)
We'll check hemoglobin A1c and followup visit in May. Patient is advised to continue using insulin as directed and to monitor her blood sugars. She denies symptoms of hypoglycemia She is advised to contact the clinic for high blood sugar measurements below 70.  Will submit refills for insulin needles, insulin, and test strips today.  Well begin a trial of amitriptyline for treatment of her peripheral neuropathy. We'll start with 50 mg tablets and assess her response to this at her followup visit.

## 2011-05-31 NOTE — Patient Instructions (Signed)
Schedule followup appointment with Dr. Arvilla Market in May. Stopped taking Mobic. Start taking diclofenac. Do not take any additional ibuprofen, Motrin, Aleve, naproxen, Naprosyn, Advil, or other NSAID while you take diclofenac. We will refer you to a rheumatologist. Amitriptyline as a new medicine to help with the nerve pain in her feet. Take one half of a tablet every night for 3-4 nights then increase to one full tablet at night. Your increased thirst and increased urination will improve once you start taking insulin. Keep taking all of your medicine as directed

## 2011-05-31 NOTE — Assessment & Plan Note (Signed)
Patient continues to experience left hip pain. Plain film x-ray revealed evidence of mild degenerative joint disease. Possible this is controlling her pain. She is not experiencing significant relief from other current will attempt treatment with diclofenac. She's advised to discontinue Mobic as well as all other nonsteroidal anti-inflammatory agents. Discussed possibility of pursuing targeted injection therapy for additional pain relief. Patient wishes to consider this before making a final decision. Discussed benefits of physical therapy in the treatment of osteoarthritis. Patient is hesitant but amenable to this.will refer her for her physical therapy today.

## 2011-05-31 NOTE — Assessment & Plan Note (Signed)
Patient expresses an interest to resume care with rheumatology. She has a history of positive rheumatoid factor as well as positive CCP. Will refer her to a rheumatologist in [place] area today.

## 2011-05-31 NOTE — Progress Notes (Signed)
Subjective:     [name], female   DOB: March 21, 1970, 41 y.o.   MRN: [id number]  HPI Pt is a 41 y/o F here today for f/u on her hip pain.  She reports persistent pain in her left hip since her last OV in Feb 2013.  Plain film xray was obtained at that time and revealed mild degenerative joint disease.  She reports increase difficulty ambulating and performing her usual activities.  She notes the pain is worse with weight bearing and movement.  She has tried taking gabapentin, mobic, tramadol, and codeine with minimal relief of her symptoms.  She notes the pain is interfering with her sleep.  DM: pt states she ran out of insulin and supplies for a few months because her medicaid ran out.  She is now able to obtain prescriptions and supplies.  She needs prescriptions for insulin, syringes, and strips.  RA:  Pt states she wishes to resume care with a rheumatologist.  She states she prefers to see a physician in her [place].  Peripheral neuropathy: Patient notes her symptoms tingling, burning, and shooting "electric" pain persisted her feet.  She has not noted significant relief from gabapentin   Review of Systems Review of Systems  Constitutional: Negative for fever, chills, diaphoresis, activity change, appetite change, fatigue and unexpected weight change.  HENT: Negative for hearing loss, congestion and neck stiffness.   Eyes: Negative for photophobia, pain and visual disturbance.  Respiratory: Negative for cough, chest tightness, shortness of breath and wheezing.   Cardiovascular: Negative for chest pain and palpitations.  Gastrointestinal: Negative for abdominal pain, blood in stool and anal bleeding.  Genitourinary: Negative for dysuria, hematuria and difficulty urinating.  Musculoskeletal: Negative for joint swelling.  Neurological: Negative for dizziness, syncope, speech difficulty, weakness, and numbness.      Objective:   Physical Exam Constitutional: She is  oriented to person, place, and time. She appears well-developed and well-nourished. No distress.  HENT:  Head: Normocephalic and atraumatic.  Mouth/Throat: No oropharyngeal exudate.  Neck: Normal range of motion. Neck supple. No JVD present. No tracheal deviation present. No thyromegaly present.  Cardiovascular: Normal rate and regular rhythm.  Exam reveals no gallop and no friction rub.   No murmur heard. Pulmonary/Chest: Effort normal and breath sounds normal. No stridor. No respiratory distress. She has no wheezes. She has no rales. She exhibits no tenderness.  Abdominal: Soft. Bowel sounds are normal. She exhibits no distension. There is no tenderness. There is no rebound and no guarding.  Musculoskeletal: Tender to palpation around hip joint, painful internal and external rotation  Lymphadenopathy:    She has no cervical adenopathy.  Neurological: She is alert and oriented to person, place, and time. She has normal reflexes. No cranial nerve deficit. Gait is normal. Skin: Skin is warm. She is not diaphoretic.     Assessment/Plan:

## 2011-06-14 ENCOUNTER — Ambulatory Visit: Payer: Medicaid Other | Admitting: Rehabilitative and Restorative Service Providers"

## 2011-06-22 ENCOUNTER — Ambulatory Visit: Payer: Medicaid Other | Admitting: Physical Therapy

## 2011-07-03 NOTE — Progress Notes (Signed)
Addended by: Neomia Dear on: 07/03/2011 05:16 PM   Modules accepted: Orders

## 2011-08-04 ENCOUNTER — Encounter: Payer: Medicaid Other | Admitting: Internal Medicine

## 2011-08-08 ENCOUNTER — Emergency Department (HOSPITAL_COMMUNITY)
Admission: EM | Admit: 2011-08-08 | Discharge: 2011-08-08 | Disposition: A | Discharge status: Home / Self Care | Payer: Medicaid Other | Source: Home / Self Care | Attending: Emergency Medicine | Admitting: Emergency Medicine

## 2011-08-08 ENCOUNTER — Encounter (HOSPITAL_COMMUNITY): Payer: Self-pay | Admitting: Emergency Medicine

## 2011-08-08 DIAGNOSIS — J329 Chronic sinusitis, unspecified: Secondary | ICD-10-CM

## 2011-08-08 MED ORDER — FEXOFENADINE-PSEUDOEPHED ER 60-120 MG PO TB12: 1.0000 | ORAL_TABLET | Freq: Two times a day (BID) | ORAL | Status: DC

## 2011-08-08 NOTE — ED Notes (Signed)
Vitals obtained by CMA student 

## 2011-08-08 NOTE — ED Notes (Signed)
Headache, sinus congestion pressure, sneezing, coughing and facial pressure

## 2011-08-08 NOTE — Discharge Instructions (Signed)
  As discussed with use of Allegra-D for your ongoing sinus congestion and discomfort. Can take Motrin Tylenol for pain. If your symptoms persist beyond 7-10 days return or followup with your primary care Dr.    Sinusitis Sinuses are air pockets within the bones of your face. The growth of bacteria within a sinus leads to infection. The infection prevents the sinuses from draining. This infection is called sinusitis. SYMPTOMS  There will be different areas of pain depending on which sinuses have become infected.  The maxillary sinuses often produce pain beneath the eyes.   Frontal sinusitis may cause pain in the middle of the forehead and above the eyes.  Other problems (symptoms) include:  Toothaches.   Colored, pus-like (purulent) drainage from the nose.   Swelling, warmth, and tenderness over the sinus areas may be signs of infection.  TREATMENT  Sinusitis is most often determined by an exam.X-rays may be taken. If x-rays have been taken, make sure you obtain your results or find out how you are to obtain them. Your caregiver may give you medications (antibiotics). These are medications that will help kill the bacteria causing the infection. You may also be given a medication (decongestant) that helps to reduce sinus swelling.  HOME CARE INSTRUCTIONS   Only take over-the-counter or prescription medicines for pain, discomfort, or fever as directed by your caregiver.   Drink extra fluids. Fluids help thin the mucus so your sinuses can drain more easily.   Applying either moist heat or ice packs to the sinus areas may help relieve discomfort.   Use saline nasal sprays to help moisten your sinuses. The sprays can be found at your local drugstore.  SEEK IMMEDIATE MEDICAL CARE IF:  You have a fever.   You have increasing pain, severe headaches, or toothache.   You have nausea, vomiting, or drowsiness.   You develop unusual swelling around the face or trouble seeing.  MAKE SURE  YOU:   Understand these instructions.   Will watch your condition.   Will get help right away if you are not doing well or get worse.  Document Released: 02/20/2005 Document Revised: 02/09/2011 Document Reviewed: 09/19/2006 [facility] Patient Information 2012 [place], [place].

## 2011-08-08 NOTE — ED Provider Notes (Signed)
History     CSN: [id number]  Arrival date & time 08/08/11  1747   First MD Initiated Contact with Patient 08/08/11 1753      Chief Complaint  Patient presents with  . URI    (Consider location/radiation/quality/duration/timing/severity/associated sxs/prior treatment) HPI Comments: Patient presents today to urgent care complaining of sinus congestion and pressure (points the maxillary sinuses). He's also been sneezing and having dripping, with a mild cough. No fevers but has been having more pressure today. She couldn't go to work today because of her symptoms. It's also requesting a work note. Denies any earache, or fevers, and taking Sudafed and is not really helping.  Patient is a 41 y.o. female presenting with URI.  URI The primary symptoms include sore throat and cough. Primary symptoms do not include fever, fatigue, swollen glands, nausea, vomiting, myalgias, arthralgias or rash. The current episode started 2 days ago. This is a new problem.  Symptoms associated with the illness include facial pain, sinus pressure, congestion and rhinorrhea. The illness is not associated with chills.    Past Medical History  Diagnosis Date  . Peripheral neuropathy   . Fibromyalgia   . Episodic recurrent vertigo   . Vitamin d deficiency     history of  . Hyperlipidemia   . Diabetes mellitus   . Chronic rhinitis   . Depression   . Asthma   . Tinnitus     history of, Evaluated by ENT> [place]  . Rheumatoid arthritis     seen by Rheumatology in [facility], Rheumatoid Factor pos, Anti CCP pos in 2005 -? early presentation of RF  . Obesity   . History of hip fracture     hx childhood hip fracture    Past Surgical History  Procedure Date  . Cesarean section     Family History  Problem Relation Age of Onset  . Hypertension Mother   . Diabetes Maternal Grandmother   . Arthritis Maternal Grandmother   . Kidney disease Maternal Grandmother     History  Substance Use Topics  .  Smoking status: Never Smoker   . Smokeless tobacco: Not on file  . Alcohol Use: Yes     ccasionally    OB History    Grav Para Term Preterm Abortions TAB SAB Ect Mult Living                  Review of Systems  Constitutional: Negative for fever, chills and fatigue.  HENT: Positive for congestion, sore throat, rhinorrhea and sinus pressure.   Respiratory: Positive for cough.   Gastrointestinal: Negative for nausea and vomiting.  Musculoskeletal: Negative for myalgias and arthralgias.  Skin: Negative for rash.    Allergies  Review of patient's allergies indicates no known allergies.  Home Medications   Current Outpatient Rx  Name Route Sig Dispense Refill  . SUDAFED PO Oral Take by mouth.    . ALBUTEROL SULFATE HFA 108 (90 BASE) MCG/ACT IN AERS Inhalation Inhale 2 puffs into the lungs every 6 (six) hours as needed for wheezing or shortness of breath. 3.7 g 6  . ALBUTEROL 90 MCG/ACT IN AERS  2 puffs 4 (four) times daily as needed. For shortness of breath.     . AMITRIPTYLINE HCL 50 MG PO TABS Oral Take 1 tablet (50 mg total) by mouth at bedtime. 30 tablet 3  . BUDESONIDE 32 MCG/ACT NA SUSP Nasal 2 sprays by Nasal route daily. 1 Bottle 2  . DICLOFENAC SODIUM 50 MG PO  TBEC Oral Take 1 tablet (50 mg total) by mouth 2 (two) times daily. 60 tablet 1  . FEXOFENADINE-PSEUDOEPHED ER 60-120 MG PO TB12 Oral Take 1 tablet by mouth every 12 (twelve) hours. 30 tablet 0  . GLUCOSE BLOOD VI STRP Other 1 each by Other route 3 (three) times daily. Dx code: 250.62 100 each 6  . INSULIN GLARGINE 100 UNIT/ML [place] SOLN  Inject 40 units daily at bedtime. 15 mL 1  . INSULIN GLARGINE 100 UNIT/ML [place] SOLN Subcutaneous Inject 40 Units into the skin at bedtime. 10 mL 6    Dx: 250.62  . INSULIN PEN NEEDLE 31G X 5 MM MISC  Use to inject lantus insulin once daily in the morning 100 each 6    Dx: 250.62  . ACCU-CHEK MULTICLIX LANCETS MISC  Use to test blood sugar 3 times a day       LMP 08/08/2011  Physical  Exam  Nursing note and vitals reviewed. Constitutional: She appears well-developed and well-nourished.  HENT:  Head: Normocephalic.  Right Ear: Tympanic membrane normal.  Left Ear: Tympanic membrane normal.  Mouth/Throat: Uvula is midline, oropharynx is clear and moist and mucous membranes are normal.  Eyes: Conjunctivae are normal.  Neck: Neck supple.  Pulmonary/Chest: Effort normal and breath sounds normal. No accessory muscle usage. Not tachypneic and not bradypneic. No respiratory distress.  Skin: No rash noted.    ED Course  Procedures (including critical care time)  Labs Reviewed - No data to display No results found.   1. Sinusitis       MDM  Patient presents with sinus congestion for 2 days. Afebrile. Patient was encouraged to use Allegra-D for 2 weeks. Encouraged to return if no improvement is noted in her from 7-10 days. Incidentally we also discussed her last hemoglobin A1c as provided in the systems 14 she admits that she doesn't use her medications daily for several reasons. We discussed risk associated with long term diabetes she understands this risk        Jimmie Molly, MD 08/08/11 1919

## 2011-08-16 ENCOUNTER — Emergency Department (INDEPENDENT_AMBULATORY_CARE_PROVIDER_SITE_OTHER)
Admission: EM | Admit: 2011-08-16 | Discharge: 2011-08-16 | Disposition: A | Discharge status: Home / Self Care | Payer: Medicaid Other | Source: Home / Self Care | Attending: Emergency Medicine | Admitting: Emergency Medicine

## 2011-08-16 ENCOUNTER — Emergency Department (INDEPENDENT_AMBULATORY_CARE_PROVIDER_SITE_OTHER): Payer: Self-pay

## 2011-08-16 ENCOUNTER — Encounter (HOSPITAL_COMMUNITY): Payer: Self-pay | Admitting: *Deleted

## 2011-08-16 DIAGNOSIS — S139XXA Sprain of joints and ligaments of unspecified parts of neck, initial encounter: Secondary | ICD-10-CM

## 2011-08-16 DIAGNOSIS — S161XXA Strain of muscle, fascia and tendon at neck level, initial encounter: Secondary | ICD-10-CM

## 2011-08-16 MED ORDER — METHOCARBAMOL 500 MG PO TABS: 500.0000 mg | ORAL_TABLET | Freq: Three times a day (TID) | ORAL | Status: AC

## 2011-08-16 MED ORDER — TRAMADOL HCL 50 MG PO TABS: 100.0000 mg | ORAL_TABLET | Freq: Three times a day (TID) | ORAL | Status: AC | PRN
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
## 2011-08-16 NOTE — Discharge Instructions (Signed)
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
## 2011-09-05 ENCOUNTER — Encounter (HOSPITAL_COMMUNITY): Payer: Self-pay

## 2011-09-05 ENCOUNTER — Emergency Department (HOSPITAL_COMMUNITY)
Admission: EM | Admit: 2011-09-05 | Discharge: 2011-09-05 | Disposition: A | Discharge status: Home / Self Care | Payer: Medicaid Other | Source: Home / Self Care

## 2011-09-05 DIAGNOSIS — S058X9A Other injuries of unspecified eye and orbit, initial encounter: Secondary | ICD-10-CM

## 2011-09-05 DIAGNOSIS — S0501XA Injury of conjunctiva and corneal abrasion without foreign body, right eye, initial encounter: Secondary | ICD-10-CM

## 2011-09-05 DIAGNOSIS — H5711 Ocular pain, right eye: Secondary | ICD-10-CM

## 2011-09-05 MED ORDER — POLYMYXIN B-TRIMETHOPRIM 10000-0.1 UNIT/ML-% OP SOLN: 1.0000 [drp] | OPHTHALMIC | Status: AC

## 2011-09-05 MED ORDER — FLUORESCEIN SODIUM 1 MG OP STRP
1.0000 | ORAL_STRIP | Freq: Once | OPHTHALMIC | Status: AC
  Administered 2011-09-05: 1 via OPHTHALMIC

## 2011-09-05 MED ORDER — TETRACAINE HCL 0.5 % OP SOLN
1.0000 [drp] | Freq: Once | OPHTHALMIC | Status: AC
  Administered 2011-09-05: 1 [drp] via OPHTHALMIC

## 2011-09-05 NOTE — ED Provider Notes (Signed)
History     CSN: [id number]  Arrival date & time 09/05/11  1516   None     Chief Complaint  Patient presents with  . Eye Pain    (Consider location/radiation/quality/duration/timing/severity/associated sxs/prior treatment) Patient is a 41 y.o. female presenting with eye pain. The history is provided by the patient.  Eye Pain  patient reports right eye pain for one day.  Reports she felt like something flew in her eye yesterday, proceeded to rub it out.  Since then eye redness with pain associated with blurred vision.  No medications or no interventions tried at home.  Denies previous history of same.  Diabetic, no previous eye surgery/trauma, denies cataracts or glaucoma but does report known poor vision. No floaters or flashing lights No vision loss +R eye blurred vision + eye pain No eyelid itching + tearing No headache/scalp tenderness  Past Medical History  Diagnosis Date  . Peripheral neuropathy   . Fibromyalgia   . Episodic recurrent vertigo   . Vitamin d deficiency     history of  . Hyperlipidemia   . Diabetes mellitus   . Chronic rhinitis   . Depression   . Asthma   . Tinnitus     history of, Evaluated by ENT>   . Rheumatoid arthritis     seen by Rheumatology in [facility], Rheumatoid Factor pos, Anti CCP pos in 2005 -? early presentation of RF  . Obesity   . History of hip fracture     hx childhood hip fracture    Past Surgical History  Procedure Date  . Cesarean section     Family History  Problem Relation Age of Onset  . Hypertension Mother   . Diabetes Maternal Grandmother   . Arthritis Maternal Grandmother   . Kidney disease Maternal Grandmother     History  Substance Use Topics  . Smoking status: Never Smoker   . Smokeless tobacco: Not on file  . Alcohol Use: Yes     ccasionally    OB History    Grav Para Term Preterm Abortions TAB SAB Ect Mult Living                  Review of Systems  Eyes: Positive for pain.  All  other systems reviewed and are negative.    Allergies  Review of patient's allergies indicates no known allergies.  Home Medications   Current Outpatient Rx  Name Route Sig Dispense Refill  . ALBUTEROL SULFATE HFA 108 (90 BASE) MCG/ACT IN AERS Inhalation Inhale 2 puffs into the lungs every 6 (six) hours as needed for wheezing or shortness of breath. 3.7 g 6  . AMITRIPTYLINE HCL 50 MG PO TABS Oral Take 1 tablet (50 mg total) by mouth at bedtime. 30 tablet 3  . DICLOFENAC SODIUM 50 MG PO TBEC Oral Take 1 tablet (50 mg total) by mouth 2 (two) times daily. 60 tablet 1  . GLUCOSE BLOOD VI STRP Other 1 each by Other route 3 (three) times daily. Dx code: 250.62 100 each 6  . INSULIN GLARGINE 100 UNIT/ML [place] SOLN  Inject 40 units daily at bedtime. 15 mL 1  . ACCU-CHEK MULTICLIX LANCETS MISC  Use to test blood sugar 3 times a day     . ALBUTEROL 90 MCG/ACT IN AERS  2 puffs 4 (four) times daily as needed. For shortness of breath.     . BUDESONIDE 32 MCG/ACT NA SUSP Nasal 2 sprays by Nasal route daily. 1 Bottle 2  .  FEXOFENADINE-PSEUDOEPHED ER 60-120 MG PO TB12 Oral Take 1 tablet by mouth every 12 (twelve) hours. 30 tablet 0  . INSULIN GLARGINE 100 UNIT/ML [place] SOLN Subcutaneous Inject 40 Units into the skin at bedtime. 10 mL 6    Dx: 250.62  . INSULIN PEN NEEDLE 31G X 5 MM MISC  Use to inject lantus insulin once daily in the morning 100 each 6    Dx: 250.62  . NAPROXEN SODIUM 220 MG PO TABS Oral Take 220 mg by mouth 2 (two) times daily with a meal.    . SUDAFED PO Oral Take by mouth.    . POLYMYXIN B-TRIMETHOPRIM 10000-0.1 UNIT/ML-% OP SOLN Right Eye Place 1 drop into the right eye every 4 (four) hours. 10 mL 0    BP 126/88  Pulse 77  Temp 99.3 F (37.4 C) (Oral)  Resp 18  SpO2 99%  LMP 09/03/2011  Physical Exam  Vitals reviewed. Constitutional: She is oriented to person, place, and time. Vital signs are normal. She appears well-developed and well-nourished. She is active and cooperative.    HENT:  Head: Normocephalic.  Right Ear: Hearing, tympanic membrane, external ear and ear canal normal.  Left Ear: Hearing, tympanic membrane, external ear and ear canal normal.  Nose: Right sinus exhibits maxillary sinus tenderness. Left sinus exhibits maxillary sinus tenderness.  Mouth/Throat: Uvula is midline, oropharynx is clear and moist and mucous membranes are normal.  Eyes: EOM are normal. Pupils are equal, round, and reactive to light. No foreign bodies found. Right eye exhibits no discharge. Right conjunctiva is injected. No scleral icterus.         Tetracaine and fluoroscien applied to right eye, examined with woods lamp.  Right corneal abrasion noted, no foreign body found.  Pt tolerated procedure wellSclera redness, linear irregular abrasion at 11 o'clock (green)  Neck: Trachea normal and normal range of motion. Neck supple.  Cardiovascular: Normal rate, regular rhythm and normal pulses.   Pulmonary/Chest: Effort normal and breath sounds normal.  Lymphadenopathy:       Head (right side): No preauricular, no posterior auricular and no occipital adenopathy present.       Head (left side): No preauricular, no posterior auricular and no occipital adenopathy present.    She has no cervical adenopathy.  Neurological: She is alert and oriented to person, place, and time. No cranial nerve deficit or sensory deficit.  Skin: Skin is warm and dry.  Psychiatric: She has a normal mood and affect. Her speech is normal and behavior is normal. Judgment and thought content normal. Cognition and memory are normal.    ED Course  Procedures (including critical care time)  Labs Reviewed - No data to display No results found.   1. Corneal abrasion, right   2. Pain in right eye       MDM  Eye drops every 2h today, then every four hours.  Warm compresses to assist with swelling.  RTC if symptoms worsen. Follow up with opthalmologist for further evaluation of poor vision.           Johnsie Kindred, NP 09/05/11 1710

## 2011-09-05 NOTE — ED Notes (Signed)
Pt states she had sharp pain in rt eye last night at work.  States since it has been tearing frequently, painful and red.  Denies injury.  States it burns but no itching.

## 2011-09-05 NOTE — ED Provider Notes (Signed)
Medical screening examination/treatment/procedure(s) were performed by non-physician practitioner and as supervising physician I was immediately available for consultation/collaboration.  Luiz Blare MD   Luiz Blare, MD 09/05/11 2121

## 2011-09-06 ENCOUNTER — Encounter: Payer: Self-pay | Admitting: Internal Medicine

## 2011-09-06 ENCOUNTER — Telehealth: Payer: Self-pay | Admitting: *Deleted

## 2011-09-06 ENCOUNTER — Ambulatory Visit (INDEPENDENT_AMBULATORY_CARE_PROVIDER_SITE_OTHER): Payer: Medicaid Other | Admitting: Internal Medicine

## 2011-09-06 VITALS — BP 109/71 | HR 79 | Temp 97.3°F | Ht 62.0 in | Wt 159.8 lb

## 2011-09-06 DIAGNOSIS — IMO0002 Reserved for concepts with insufficient information to code with codable children: Secondary | ICD-10-CM

## 2011-09-06 DIAGNOSIS — M79609 Pain in unspecified limb: Secondary | ICD-10-CM

## 2011-09-06 DIAGNOSIS — M1610 Unilateral primary osteoarthritis, unspecified hip: Secondary | ICD-10-CM

## 2011-09-06 DIAGNOSIS — Z659 Problem related to unspecified psychosocial circumstances: Secondary | ICD-10-CM

## 2011-09-06 DIAGNOSIS — E1143 Type 2 diabetes mellitus with diabetic autonomic (poly)neuropathy: Secondary | ICD-10-CM

## 2011-09-06 DIAGNOSIS — Z79899 Other long term (current) drug therapy: Secondary | ICD-10-CM

## 2011-09-06 DIAGNOSIS — M169 Osteoarthritis of hip, unspecified: Secondary | ICD-10-CM

## 2011-09-06 DIAGNOSIS — M1612 Unilateral primary osteoarthritis, left hip: Secondary | ICD-10-CM

## 2011-09-06 DIAGNOSIS — F3289 Other specified depressive episodes: Secondary | ICD-10-CM

## 2011-09-06 DIAGNOSIS — E1142 Type 2 diabetes mellitus with diabetic polyneuropathy: Secondary | ICD-10-CM

## 2011-09-06 DIAGNOSIS — F329 Major depressive disorder, single episode, unspecified: Secondary | ICD-10-CM

## 2011-09-06 DIAGNOSIS — E1149 Type 2 diabetes mellitus with other diabetic neurological complication: Secondary | ICD-10-CM

## 2011-09-06 MED ORDER — HYDROCODONE-ACETAMINOPHEN 5-325 MG PO TABS: 1.0000 | ORAL_TABLET | Freq: Four times a day (QID) | ORAL | Status: AC | PRN

## 2011-09-06 MED ORDER — METFORMIN HCL 500 MG PO TABS: 500.0000 mg | ORAL_TABLET | Freq: Every day | ORAL | Status: DC

## 2011-09-06 NOTE — Assessment & Plan Note (Addendum)
Blood sugar averages 344mg /dL. HBA1C is 13.6%. She admits to noncompliance to her Lantus since starting it 2 years ago. She sometimes get hyperglycemic symptoms. I have educated her about the importance of compliance to medications and daily monitoring of her blood sugar. At this time we will continue with current insulin dose until next review in about 2 weeks. Metformin has been added. Creatinine level reviewed and normal. Treatment will be appropriately adjusted on follow up visits.

## 2011-09-06 NOTE — Progress Notes (Signed)
Hydrocodone 5/325mg  rx faxed to [facility] on [address] pt awared.

## 2011-09-06 NOTE — Telephone Encounter (Signed)
Pt called stating she  fell yesterday chasing  her son and landed on rt side . Pain to hip area.  Painful with ambulation.  She is taking diclofenic without relief.  She has a job that requires a lot of lifting and would like to be seen. Pt was seen in ED yesterday for eye pain but did not report the hip pain.  Will see today at 1:30

## 2011-09-06 NOTE — Assessment & Plan Note (Signed)
Patient is stressed out because of her home situation with an autistic son and the demands of her job. She has been referred to social worker to assist her find help on this.

## 2011-09-06 NOTE — Patient Instructions (Signed)
Do not drive while taking Hydrocodone  Take Lantus every day   To follow up with sports Medicine  Social worker to contact you next week

## 2011-09-06 NOTE — Progress Notes (Signed)
Subjective:     [name], female   DOB: [birth year], 41 y.o.   MRN: [id number]  HPI Adriana Burke is a 41 year old woman with history of uncontrolled DM type 2 and chronic left hip pain secondary to arthritis. She fall onto her left hip 3 days ago and this exacerbated her pain to 8/10 in severity. She has been on multiple pain medicines including Tramadol, Toradol, Naproxen and Diclofenac without much relief. She reports that sometimes she feels like she is losing her balance due to this hip pain. The pain is constant, localized to lateral hip and increased with activity. 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 She has a history of trauma as an infant which required casting of her left leg as told by her mother. She reports visual disturbance when her blood sugar is high. Home readings are usually 300-400mg /dl. She admits to erratic use of her insulin injection. She is on Lantus 40 IU subcutaneously at bedtime.      Review of Systems  Constitutional: Positive for fatigue. Negative for fever.  HENT: Positive for hearing loss and tinnitus.   Eyes: Positive for pain and redness. Negative for itching.  Respiratory: Negative for cough, chest tightness, shortness of breath and wheezing.   Musculoskeletal: Positive for back pain and gait problem. Negative for joint swelling.  Neurological: Negative for dizziness, seizures, syncope, weakness, light-headedness, numbness and headaches.  Psychiatric/Behavioral: Positive for decreased concentration.       She reports being stressed due to her 42 years old autistic son who requires a lot of her assistance.She is divorced.        Objective:   Physical Exam  Constitutional: She appears well-developed and well-nourished.  HENT:  Right Ear: External ear normal.  Left Ear: External ear normal.  Eyes:         She had accidental trauma to her right eye 4 days ago  Musculoskeletal:       Left hip: She  exhibits decreased range of motion and tenderness. She exhibits normal strength, no bony tenderness, no swelling, no crepitus, no deformity and no laceration.       Legs:      Assessment:         Plan:

## 2011-09-06 NOTE — Assessment & Plan Note (Signed)
Patient's hip pain is likely due to tronchanteric bursitis versus arthritis versus AVN (h/o hip trauma). She has been on various pain medicines without pain relief. She has been evaluated by rheumatology. Previous Xray show mild arthritis which does not fully explain her complains. Intra-articular steroid injection has been discussed but patient is hesitant on this. Hydrocodone/Acetominophen has been prescribed and she has been referred to Sports Medicine clinic for further evaluation. Will review in 2 weeks.

## 2011-09-07 NOTE — Progress Notes (Signed)
I saw, examined, and discussed the patient with Dr Zada Girt and agree with the note contained here. Adriana Burke has acute on chronic L hip pain. It is aggravated by lying on that side at night and standing long periods. She has had 2 view hip plain films in Feb 2013 that showed mild B hip DJD. She also had lower lumbar films 06/2011 that were nl. She is tender over the L trochanteric bursa. She has tried Mobic, diclofenac, naproxen, a toradol IM shot, tramadol without relief. 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 She has tried NSAIDs in past without much relief. We offered a steroid injection into the bursa today but pt was hesitant and wanted to think about it since she knows it increases sugars and her sugars are already too high. We have asked that she return to her sports med MD for further eval and tx. She was given short term only hydrocodone (ran name through [employer] and [place]). Interestingly, trochanteric bursitis was what rheum felt was the cause of her pain earlier this yr and she was Rx Voltaren gel.

## 2011-09-14 ENCOUNTER — Ambulatory Visit (INDEPENDENT_AMBULATORY_CARE_PROVIDER_SITE_OTHER): Payer: Medicaid Other | Admitting: Sports Medicine

## 2011-09-14 VITALS — BP 120/82

## 2011-09-14 DIAGNOSIS — M76899 Other specified enthesopathies of unspecified lower limb, excluding foot: Secondary | ICD-10-CM

## 2011-09-14 DIAGNOSIS — M169 Osteoarthritis of hip, unspecified: Secondary | ICD-10-CM

## 2011-09-14 DIAGNOSIS — M1610 Unilateral primary osteoarthritis, unspecified hip: Secondary | ICD-10-CM

## 2011-09-14 DIAGNOSIS — M7060 Trochanteric bursitis, unspecified hip: Secondary | ICD-10-CM

## 2011-09-14 DIAGNOSIS — M1612 Unilateral primary osteoarthritis, left hip: Secondary | ICD-10-CM

## 2011-09-14 NOTE — Progress Notes (Signed)
  Subjective:    [name], female    DOB: [birth year], 41 y.o.   MRN: [id number]  HPI Pt that comes today with pain on lateral side of thigh for about 3 weeks duration. She has been having this problem intermittently for years but this is the first time her symptoms are intense and continuous. The pain is throbbing in nature and located on upper third lateral aspect of the thigh. This pain limits her mobility and sleep quality. It is worse when changing form sitting to standing position, when walking or turning on the affected side while laying down. She has taken numerous oral analgesics, NSAIDs and muscle relaxants with no relieve of her symptoms. Denies recent trauma. No low back pain, no numbness or tingling in the left leg. She's tried numerous anti-inflammatories as well as tramadol and Vicodin but nothing seems to be helpful. She has not tried physical therapy and has not had any injections. She has a history of some type of hip fracture as a child which was treated with casting. She denies any groin pain. She has had x-rays of his hip done in February of this year.  Review of Systems Per HPI    Objective:   Physical Exam Left LE:  Smooth painless hip range of motion with a negative log roll. Point tenderness over the left greater trochanteric bursa. Negative straight leg raise. Tremendous hip abductor weakness partially due to pain. Neurovascular she is intact distally and walking with a slight limp.  X-rays of her left hip done on 04/14/2011 are reviewed. She has some acetabular spurring but otherwise unremarkable. Nothing acute is seen.       Assessment & Plan:  #1. Left hip pain secondary greater trochanteric bursitis  Patient will start physical therapy for iontophoresis IT band stretches and hip abductor strengthening. She can wean to home exercise program per the therapist's discretion. She has already tried multiple anti-inflammatories and pain medication. She  will return to the office in 3-4 weeks for recheck. If symptoms persist we will consider cortisone injection.

## 2011-09-18 ENCOUNTER — Ambulatory Visit: Payer: Medicaid Other | Attending: Sports Medicine | Admitting: Physical Therapy

## 2011-09-18 DIAGNOSIS — R269 Unspecified abnormalities of gait and mobility: Secondary | ICD-10-CM | POA: Insufficient documentation

## 2011-09-18 DIAGNOSIS — M6281 Muscle weakness (generalized): Secondary | ICD-10-CM | POA: Insufficient documentation

## 2011-09-18 DIAGNOSIS — IMO0001 Reserved for inherently not codable concepts without codable children: Secondary | ICD-10-CM | POA: Insufficient documentation

## 2011-09-18 DIAGNOSIS — M25659 Stiffness of unspecified hip, not elsewhere classified: Secondary | ICD-10-CM | POA: Insufficient documentation

## 2011-09-18 DIAGNOSIS — M25559 Pain in unspecified hip: Secondary | ICD-10-CM | POA: Insufficient documentation

## 2011-09-19 ENCOUNTER — Telehealth: Payer: Self-pay | Admitting: Licensed Clinical Social Worker

## 2011-09-19 NOTE — Telephone Encounter (Signed)
Adriana Burke was referred to CSW for Social Problem.  During Adriana Burke's office visit, pt discussed stressors with providing care for her 42 year old son, diagnosed with Autism.  CSW placed call to Adriana Burke.  Pt states "I found what I need, I'm fine now".  Pt denies add'l need for CSW at this time.  Adriana Burke has CSW contact information and hours.  Pt aware CSW if available to assist as needed.

## 2011-09-29 ENCOUNTER — Ambulatory Visit: Payer: Medicaid Other | Admitting: Rehabilitation

## 2011-10-03 ENCOUNTER — Encounter: Payer: Self-pay | Admitting: Physical Therapy

## 2011-10-05 ENCOUNTER — Encounter: Payer: Self-pay | Admitting: Sports Medicine

## 2011-10-10 ENCOUNTER — Ambulatory Visit: Payer: Self-pay | Attending: Sports Medicine | Admitting: Physical Therapy

## 2011-10-10 DIAGNOSIS — M6281 Muscle weakness (generalized): Secondary | ICD-10-CM | POA: Insufficient documentation

## 2011-10-10 DIAGNOSIS — R269 Unspecified abnormalities of gait and mobility: Secondary | ICD-10-CM | POA: Insufficient documentation

## 2011-10-10 DIAGNOSIS — IMO0001 Reserved for inherently not codable concepts without codable children: Secondary | ICD-10-CM | POA: Insufficient documentation

## 2011-10-10 DIAGNOSIS — M25559 Pain in unspecified hip: Secondary | ICD-10-CM | POA: Insufficient documentation

## 2011-10-10 DIAGNOSIS — M25659 Stiffness of unspecified hip, not elsewhere classified: Secondary | ICD-10-CM | POA: Insufficient documentation

## 2011-10-12 ENCOUNTER — Other Ambulatory Visit: Payer: Self-pay | Admitting: Rheumatology

## 2011-10-12 DIAGNOSIS — M064 Inflammatory polyarthropathy: Secondary | ICD-10-CM

## 2011-10-18 ENCOUNTER — Other Ambulatory Visit: Payer: Medicaid Other

## 2011-10-25 ENCOUNTER — Encounter: Payer: Medicaid Other | Admitting: Rehabilitation

## 2011-11-01 ENCOUNTER — Encounter: Payer: Medicaid Other | Admitting: Rehabilitation

## 2011-11-07 ENCOUNTER — Telehealth: Payer: Self-pay | Admitting: *Deleted

## 2011-11-07 NOTE — Telephone Encounter (Signed)
Pt calls and c/o more falls and her R leg/ hip becoming worse. She is advised to arrange transportation and call tomorrow am for appt tomorrow. She is agreeable. She is advised if she feels the need to go to ED to call 911 or have someone drive her, she is agreeable

## 2011-11-08 ENCOUNTER — Ambulatory Visit (INDEPENDENT_AMBULATORY_CARE_PROVIDER_SITE_OTHER): Payer: Medicaid Other | Admitting: Internal Medicine

## 2011-11-08 ENCOUNTER — Encounter: Payer: Self-pay | Admitting: Internal Medicine

## 2011-11-08 ENCOUNTER — Ambulatory Visit: Payer: Medicaid Other | Admitting: Internal Medicine

## 2011-11-08 VITALS — BP 109/76 | HR 75 | Temp 97.1°F | Ht 64.0 in | Wt 155.4 lb

## 2011-11-08 DIAGNOSIS — E1143 Type 2 diabetes mellitus with diabetic autonomic (poly)neuropathy: Secondary | ICD-10-CM

## 2011-11-08 DIAGNOSIS — G8929 Other chronic pain: Secondary | ICD-10-CM

## 2011-11-08 DIAGNOSIS — F329 Major depressive disorder, single episode, unspecified: Secondary | ICD-10-CM

## 2011-11-08 DIAGNOSIS — IMO0001 Reserved for inherently not codable concepts without codable children: Secondary | ICD-10-CM

## 2011-11-08 DIAGNOSIS — IMO0002 Reserved for concepts with insufficient information to code with codable children: Secondary | ICD-10-CM

## 2011-11-08 DIAGNOSIS — E1149 Type 2 diabetes mellitus with other diabetic neurological complication: Secondary | ICD-10-CM

## 2011-11-08 DIAGNOSIS — F3289 Other specified depressive episodes: Secondary | ICD-10-CM

## 2011-11-08 DIAGNOSIS — E1142 Type 2 diabetes mellitus with diabetic polyneuropathy: Secondary | ICD-10-CM

## 2011-11-08 DIAGNOSIS — R52 Pain, unspecified: Secondary | ICD-10-CM

## 2011-11-08 LAB — GLUCOSE, CAPILLARY: Glucose-Capillary: 357 mg/dL — ABNORMAL HIGH (ref 70–99)

## 2011-11-08 IMAGING — CT CT ABD-PELV W/ CM
2 of 6 series · 16 of 46 positions shown, 18 images · IV contrast (water & 100ml omni 300)
Comparison: 01/26/2009

CLINICAL DATA: Left flank pain.  Left lower quadrant pain.  Nausea.

CT ABDOMEN AND PELVIS WITH CONTRAST
TECHNIQUE: Multidetector CT imaging of the abdomen and pelvis was
performed following the standard protocol during bolus
administration of intravenous contrast.
Contrast: 100mL OMNIPAQUE IOHEXOL 300 MG/ML IV SOLN

[Series 4: recon 3: routine abdomen · axial · 0.74mm/px · z∈[-481,-76]mm · 13 of 355 slices shown, 15 images]
[im 17/355  soft-tissue]
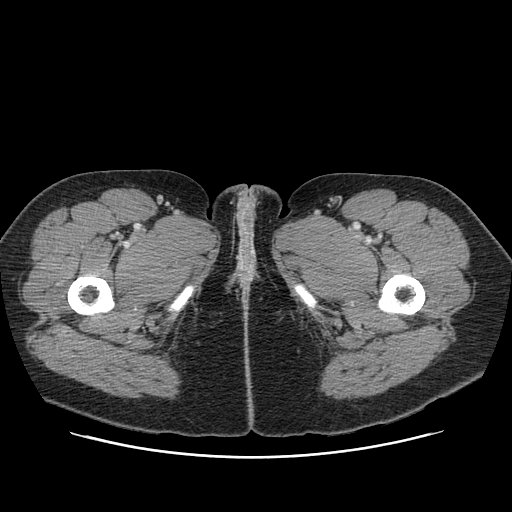
[im 17/355  bone]
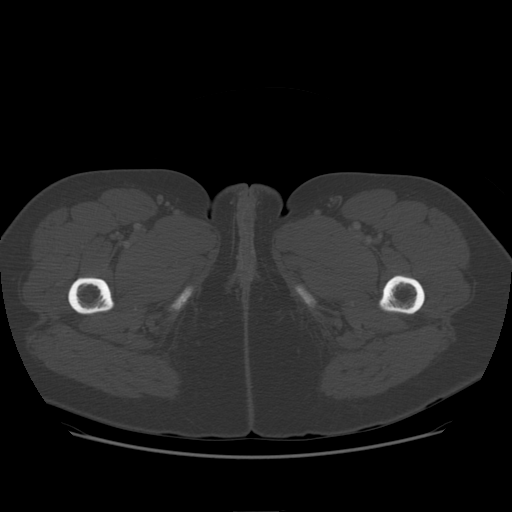
[im 49/355  soft-tissue]
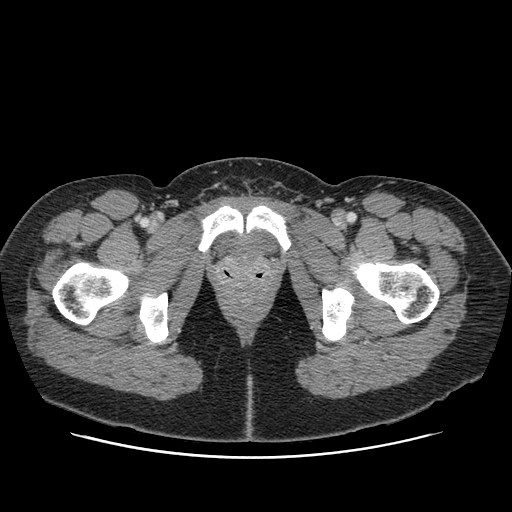
[im 81/355  soft-tissue]
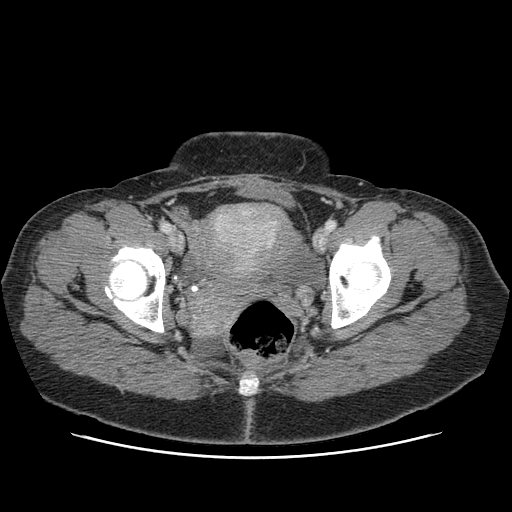
[im 97/355  soft-tissue]
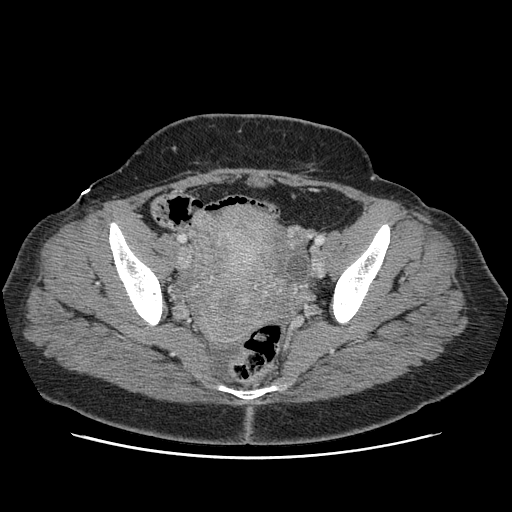
[im 129/355  soft-tissue]
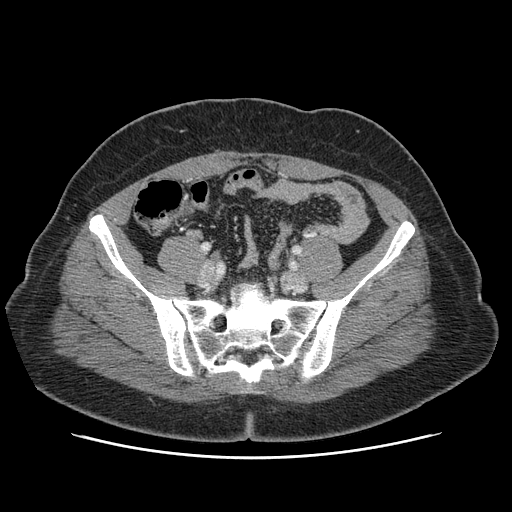
[im 145/355  soft-tissue]
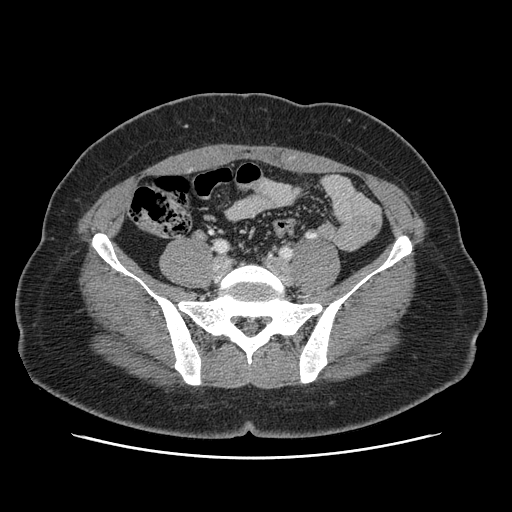
[im 178/355  soft-tissue]
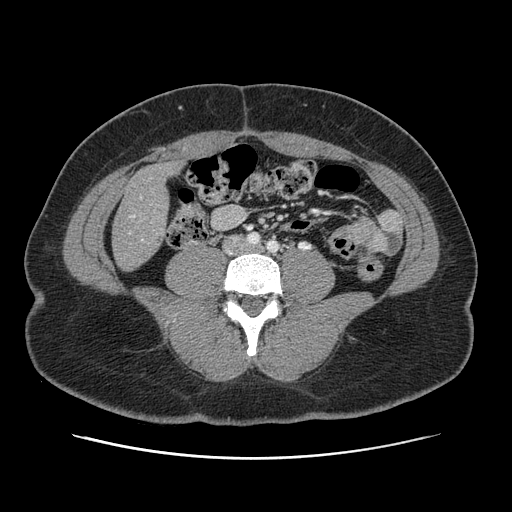
[im 210/355  soft-tissue]
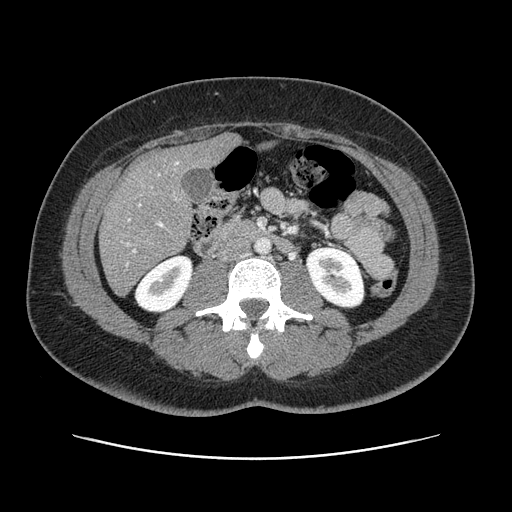
[im 226/355  soft-tissue]
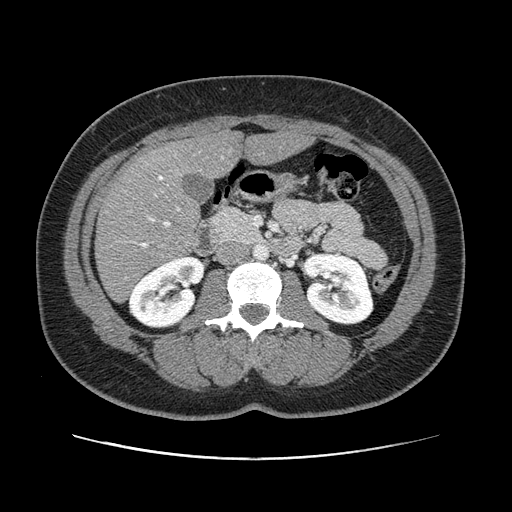
[im 226/355  bone]
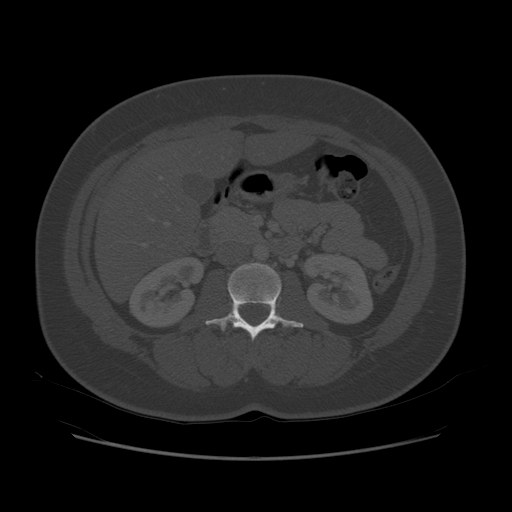
[im 258/355  soft-tissue]
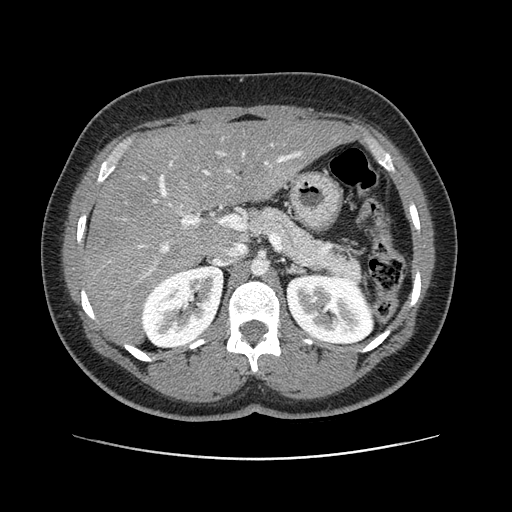
[im 274/355  soft-tissue]
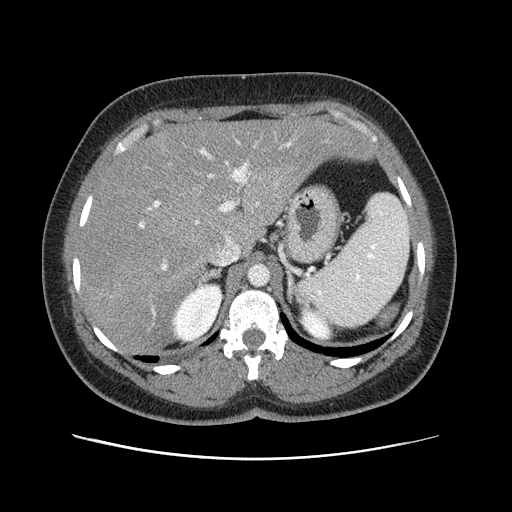
[im 306/355  soft-tissue]
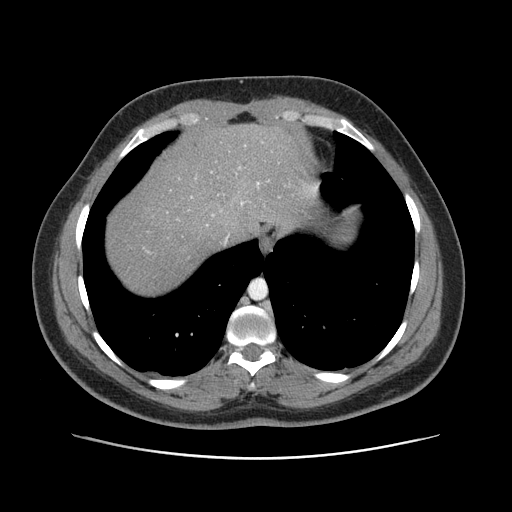
[im 338/355  soft-tissue]
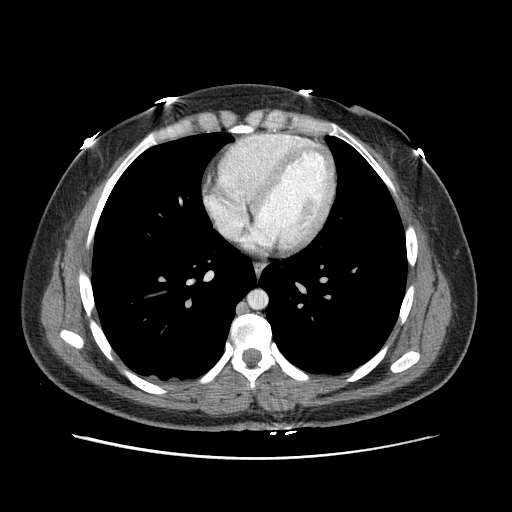

[Series 401: cor · coronal · 0.91mm/px · 3 of 94 slices shown]
[im 32/94  soft-tissue]
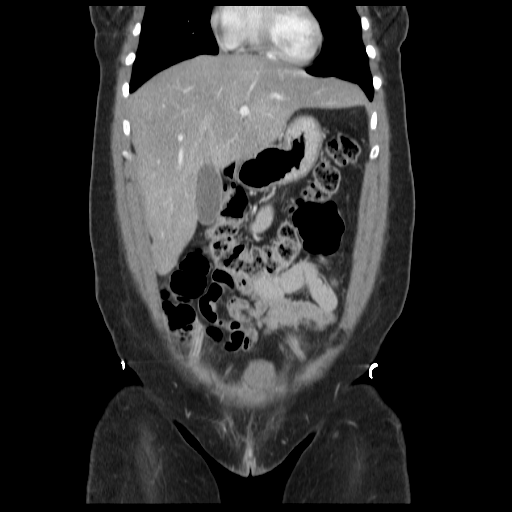
[im 42/94  soft-tissue]
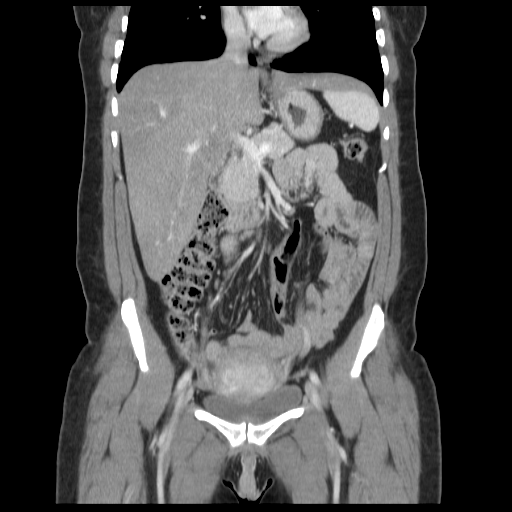
[im 52/94  soft-tissue]
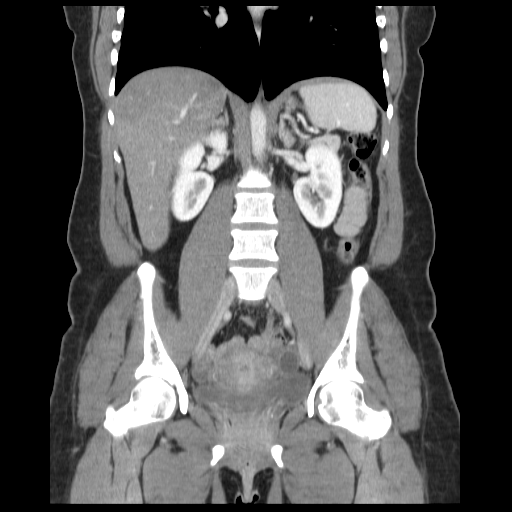

[16 of 46 positions shown; findings below may reference images not displayed]

FINDINGS: Clear lung bases.  Normal heart size without pericardial
or pleural effusion.  Prominence of the azygos and hemiazygous
veins is unchanged and nonspecific.  Moderate hepatic steatosis.
Sparing adjacent the gallbladder.  Hepatomegaly, with the right
lobe measuring 20.2 cm.

Normal spleen, stomach.  Mildly prominent dorsal pancreatic duct on
image 32, possibly representing pancreas divisum.  No evidence of
acute pancreatitis.  Normal gallbladder, biliary tract, adrenal
glands.

No evidence of renal calculi.  No renal mass or perirenal edema.
Delayed images demonstrate normal renal enhancement bilaterally
without hydronephrosis.No hydroureter or ureteric calculi.  No
retroperitoneal or retrocrural adenopathy.

Normal colon, appendix, and terminal ileum.  Loops of small bowel
positioned peripheral to the descending colon, including image 41.
This is similar to on the prior exam.  No evidence of obstruction
or mesenteric/bowel wall edema to suggest acute significance.
Small bowel otherwise within normal limits.  No abdominal ascites.
No pelvic adenopathy.  Normal uterus.  Probable dominant follicle
within the left ovary, image 66.  1.0 cm.  No right adnexal mass.
Cul-de-sac fluid is similar to on the prior and favored to be
physiologic. No acute osseous abnormality.
IMPRESSION: 1.  No explanation for left-sided flank pain.
2.  Hepatomegaly and hepatic steatosis.
3.  Probable dominant follicle within the left ovary.  Given mild
peripheral enhancement, a corpus luteal cyst could look similar.
4.  Prominent dorsal pancreatic duct could represent pancreas
divisum.  No evidence of acute pancreatitis.

## 2011-11-08 MED ORDER — AMITRIPTYLINE HCL 25 MG PO TABS: 25.0000 mg | ORAL_TABLET | Freq: Every day | ORAL | Status: DC

## 2011-11-08 NOTE — Progress Notes (Signed)
Subjective:   [name] female   DOB: 07-12-1970 41 y.o.   MRN: [id number]  HPI: [name] is a 41 y.o. woman who presents to clinic today with several complaints.  She has had problems with chronic right knee and hip pain for sometime and this has been increasing recently.  The pain starts at the outside of her right hip and travels down the She states that the pain is worse when she is walking, sitting, or laying.  She states it is a stabbing, "weakening" pain.  She states that she has also had problems with pain on the outside of the left hip for sometime as swell as some pain in her bilateral upper extremitates.  She is followed by Dr. Zenovia Jordan and saw her a few weeks back.  She has a history of a positive RF and a positive CCP per Dr. Nickola Major last visit note.  She has not had any other signs of inflammatory arthritis such as swelling or erythema.  She states that she has fallen several times over the last few weeks because of the pain in her legs.  She denies weakness or numbness in her legs.    She state that she is taking her lantus 45 units but states that she misses at least 3 days per week.  She has not taking the metformin at all because of nausea, even if she takes it with meals. She denies polyuria, polydipsia, or blurry vision.  She did not bring her meter with her to her appointment today.   She states that she is very stressed out.  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  She has noted that she has had problems lately with decreased concentration, appetite as well as increased irritibability.  She states she tries to go to bed about 1 am and up and down most of the night with her son.  She has no consistent time that she tries to get up in the morning and sometimes has times where she just gets up because she can't go back to sleep.    Past Medical History   Diagnosis Date  . Peripheral neuropathy   . Fibromyalgia   . Episodic recurrent vertigo   . Vitamin d deficiency     history of  . Hyperlipidemia   . Diabetes mellitus   . Chronic rhinitis   . Depression   . Asthma   . Tinnitus     history of, Evaluated by ENT> [place]  . Rheumatoid arthritis     seen by Rheumatology in [facility], Rheumatoid Factor pos, Anti CCP pos in 2005 -? early presentation of RF  . Obesity   . History of hip fracture     hx childhood hip fracture   Current Outpatient Prescriptions  Medication Sig Dispense Refill  . albuterol (PROVENTIL,VENTOLIN) 90 MCG/ACT inhaler 2 puffs 4 (four) times daily as needed. For shortness of breath.       Marland Kitchen amitriptyline (ELAVIL) 25 MG tablet Take 1 tablet (25 mg total) by mouth at bedtime.  30 tablet  1  . budesonide (RHINOCORT AQUA) 32 MCG/ACT nasal spray 2 sprays by Nasal route daily.  1 Bottle  2  . diclofenac (VOLTAREN) 50 MG EC tablet Take 1 tablet (50 mg total) by mouth 2 (two) times daily.  60 tablet  1  . glucose blood (ACCU-CHEK AVIVA PLUS) test strip 1 each by Other route 3 (three) times daily. Dx code: 250.62  100 each  6  . insulin glargine (LANTUS) 100 UNIT/ML injection Inject 40 Units into the skin at bedtime.  10 mL  6  . Insulin Pen Needle (PRODIGY MINI PEN NEEDLES) 31G X 5 MM MISC Use to inject lantus insulin once daily in the morning  100 each  6  . Lancets (ACCU-CHEK MULTICLIX) lancets Use to test blood sugar 3 times a day       . DISCONTD: insulin glargine (LANTUS SOLOSTAR) 100 UNIT/ML injection Inject 40 units daily at bedtime.  15 mL  1   Family History  Problem Relation Age of Onset  . Hypertension Mother   . Diabetes Maternal Grandmother   . Arthritis Maternal Grandmother   . Kidney disease Maternal Grandmother    History   Social History  . Marital Status: Legally Separated    Spouse Name: N/A    Number of Children: N/A  . Years of Education: N/A   Social History Main Topics  . Smoking  status: Never Smoker   . Smokeless tobacco: None  . Alcohol Use: Yes     ccasionally  . Drug Use: No  . Sexually Active: None   Other Topics Concern  . None   Social History Narrative   [phone]   Review of Systems: Negative except as noted in the HPI.  Objective:  Physical Exam: Filed Vitals:   11/08/11 1412  BP: 109/76  Pulse: 75  Temp: 97.1 F (36.2 C)  TempSrc: Oral  Height: 5\' 4"  (1.626 m)  Weight: 155 lb 6.4 oz (70.489 kg)  SpO2: 100%   Constitutional: Vital signs reviewed.  Patient is a well-developed and well-nourished woman in no acute distress and cooperative with exam. Alert and oriented x3.  Head: Normocephalic and atraumatic Ear: TM normal bilaterally Mouth: no erythema or exudates, MMM Eyes: PERRL, EOMI, conjunctivae normal, No scleral icterus.  Neck: Supple, Trachea midline normal ROM, No JVD, mass, thyromegaly, or carotid bruit present.  Cardiovascular: RRR, 2/6 systolic ejection murmur best heard at the apex, S1 normal, S2 normal, no RG, pulses symmetric and intact bilaterally Pulmonary/Chest: CTAB, no wheezes, rales, or rhonchi Abdominal: Soft. Non-tender, non-distended, bowel sounds are normal, no masses, organomegaly, or guarding present.  GU: no CVA tenderness Musculoskeletal: There is tenderness to palpation over the bilateral greater trochanter as well as pain to palpation down the right IT band to just below the right knee.  Bilateral hip ROM evaluation is limited by pain.  Sensation is intact.  Reflexes are 2+.  There is no erythema or swelling over any of the affected joints. No joint deformities, erythema, or stiffness,  Hematology: no cervical, inginal, or axillary adenopathy.  Neurological: A&O x3, Strength is normal and symmetric bilaterally, cranial nerve II-XII are grossly intact, no focal motor deficit, sensory intact to light touch bilaterally.  Skin: Warm, dry and intact. No rash, cyanosis, or clubbing.  Psychiatric: Normal mood and flat  affect. speech and behavior is normal. Judgment and thought content normal. Cognition and memory are normal.   Assessment & Plan:

## 2011-11-08 NOTE — Patient Instructions (Signed)
1 

## 2011-11-08 NOTE — Telephone Encounter (Signed)
Agree, thank you

## 2011-11-08 NOTE — Patient Instructions (Addendum)
1.  Start Amitriptyline 25 mg tablets.  Take 1 tablet each night at bedtime  2.  Make sure you are taking your Lantus every night.  This can also help your pain.  3.  Follow up in 2-4 weeks to see how you are doing.

## 2011-11-26 ENCOUNTER — Inpatient Hospital Stay (HOSPITAL_COMMUNITY): Payer: Self-pay

## 2011-11-26 ENCOUNTER — Encounter (HOSPITAL_COMMUNITY): Payer: Self-pay | Admitting: *Deleted

## 2011-11-26 ENCOUNTER — Inpatient Hospital Stay (HOSPITAL_COMMUNITY)
Admission: AD | Admit: 2011-11-26 | Discharge: 2011-11-26 | Disposition: A | Discharge status: Home / Self Care | Payer: Self-pay | Source: Ambulatory Visit | Attending: Obstetrics and Gynecology | Admitting: Obstetrics and Gynecology

## 2011-11-26 DIAGNOSIS — N760 Acute vaginitis: Secondary | ICD-10-CM | POA: Insufficient documentation

## 2011-11-26 DIAGNOSIS — A499 Bacterial infection, unspecified: Secondary | ICD-10-CM | POA: Insufficient documentation

## 2011-11-26 DIAGNOSIS — B9689 Other specified bacterial agents as the cause of diseases classified elsewhere: Secondary | ICD-10-CM | POA: Insufficient documentation

## 2011-11-26 DIAGNOSIS — R102 Pelvic and perineal pain: Secondary | ICD-10-CM

## 2011-11-26 DIAGNOSIS — N949 Unspecified condition associated with female genital organs and menstrual cycle: Secondary | ICD-10-CM | POA: Insufficient documentation

## 2011-11-26 DIAGNOSIS — R1032 Left lower quadrant pain: Secondary | ICD-10-CM | POA: Insufficient documentation

## 2011-11-26 LAB — WET PREP, GENITAL
Trich, Wet Prep: NONE SEEN
Yeast Wet Prep HPF POC: NONE SEEN

## 2011-11-26 LAB — URINALYSIS, ROUTINE W REFLEX MICROSCOPIC
Bilirubin Urine: NEGATIVE
Glucose, UA: >1000 mg/dL — AB
Hgb urine dipstick: NEGATIVE
Ketones, ur: NEGATIVE mg/dL
Nitrite: NEGATIVE
Specific Gravity, Urine: 1.01 (ref 1.005–1.030)
pH: 6 (ref 5.0–8.0)

## 2011-11-26 LAB — URINE MICROSCOPIC-ADD ON: RBC / HPF: NONE SEEN RBC/hpf (ref <3)

## 2011-11-26 LAB — CBC
Hemoglobin: 12.1 g/dL (ref 12.0–15.0)
MCH: 22.7 pg — ABNORMAL LOW (ref 26.0–34.0)
MCHC: 31.6 g/dL (ref 30.0–36.0)
RDW: 14.9 % (ref 11.5–15.5)

## 2011-11-26 MED ORDER — KETOROLAC TROMETHAMINE 60 MG/2ML IM SOLN
60.0000 mg | Freq: Once | INTRAMUSCULAR | Status: AC
  Administered 2011-11-26: 60 mg via INTRAMUSCULAR
  Filled 2011-11-26: qty 2

## 2011-11-26 MED ORDER — METRONIDAZOLE 500 MG PO TABS: 500.0000 mg | ORAL_TABLET | Freq: Two times a day (BID) | ORAL | Status: DC

## 2011-11-26 NOTE — MAU Provider Note (Signed)
History     CSN: [id number]  Arrival date and time: 11/26/11 1222   None     Chief Complaint  Patient presents with  . Abdominal Pain   HPI 41 y.o. [id number] with LLQ pain x 3 months, constant, intermittently worsening, worse now x 1 week. Patient's last menstrual period was 11/14/2011. No discharge. + nausea (which is a chronic symptom), "pressure" with urination. No  Vomiting, constipation, diarrhea, fever, chills, dysuria.      Past Medical History  Diagnosis Date  . Peripheral neuropathy   . Fibromyalgia   . Episodic recurrent vertigo   . Vitamin d deficiency     history of  . Hyperlipidemia   . Diabetes mellitus   . Chronic rhinitis   . Depression   . Asthma   . Tinnitus     history of, Evaluated by ENT> [place]  . Rheumatoid arthritis     seen by Rheumatology in [facility], Rheumatoid Factor pos, Anti CCP pos in 2005 -? early presentation of RF  . Obesity   . History of hip fracture     hx childhood hip fracture    Past Surgical History  Procedure Date  . Cesarean section     Family History  Problem Relation Age of Onset  . Hypertension Mother   . Diabetes Maternal Grandmother   . Arthritis Maternal Grandmother   . Kidney disease Maternal Grandmother     History  Substance Use Topics  . Smoking status: Never Smoker   . Smokeless tobacco: Not on file  . Alcohol Use: Yes     ccasionally    Allergies: No Known Allergies  Prescriptions prior to admission  Medication Sig Dispense Refill  . albuterol (PROVENTIL,VENTOLIN) 90 MCG/ACT inhaler 2 puffs 4 (four) times daily as needed. For shortness of breath.       Marland Kitchen amitriptyline (ELAVIL) 25 MG tablet Take 1 tablet (25 mg total) by mouth at bedtime.  30 tablet  1  . budesonide (RHINOCORT AQUA) 32 MCG/ACT nasal spray 2 sprays by Nasal route daily.  1 Bottle  2  . diclofenac (VOLTAREN) 50 MG EC tablet Take 1 tablet (50 mg total) by mouth 2 (two) times daily.  60 tablet  1  . glucose blood (ACCU-CHEK AVIVA  PLUS) test strip 1 each by Other route 3 (three) times daily. Dx code: 250.62  100 each  6  . insulin glargine (LANTUS) 100 UNIT/ML injection Inject 40 Units into the skin at bedtime.  10 mL  6  . Insulin Pen Needle (PRODIGY MINI PEN NEEDLES) 31G X 5 MM MISC Use to inject lantus insulin once daily in the morning  100 each  6  . Lancets (ACCU-CHEK MULTICLIX) lancets Use to test blood sugar 3 times a day         Review of Systems  Constitutional: Negative.   Respiratory: Negative.   Cardiovascular: Negative.   Gastrointestinal: Positive for nausea and abdominal pain. Negative for vomiting, diarrhea and constipation.  Genitourinary: Negative for dysuria, urgency, frequency, hematuria and flank pain.       Negative for vaginal bleeding  Musculoskeletal: Negative.   Neurological: Negative.   Psychiatric/Behavioral: Negative.    Physical Exam   Blood pressure 120/77, pulse 88, temperature 97.9 F (36.6 C), temperature source Oral, resp. rate 18, height 5\' 4"  (1.626 m), weight 155 lb 12.8 oz (70.67 kg), last menstrual period 11/14/2011.  Physical Exam  Nursing note and vitals reviewed. Constitutional: She is oriented to person, place, and  time. She appears well-developed and well-nourished. No distress.  Cardiovascular: Normal rate.   Respiratory: Effort normal.  GI: Soft. There is no tenderness.  Genitourinary: There is no rash or lesion on the right labia. There is no rash or lesion on the left labia. Uterus is not enlarged and not tender. Cervix exhibits no motion tenderness and no discharge. Right adnexum displays no mass and no fullness. Left adnexum displays tenderness. Left adnexum displays no mass and no fullness. No bleeding around the vagina. No vaginal discharge found.  Musculoskeletal: Normal range of motion.  Neurological: She is alert and oriented to person, place, and time.  Skin: Skin is warm and dry.  Psychiatric: She has a normal mood and affect.    MAU Course    Procedures  Results for orders placed during the hospital encounter of 11/26/11 (from the past 24 hour(s))  URINALYSIS, ROUTINE W REFLEX MICROSCOPIC     Status: Abnormal   Collection Time   11/26/11 12:30 PM      Component Value Range   Color, Urine YELLOW  YELLOW   APPearance CLEAR  CLEAR   Specific Gravity, Urine 1.010  1.005 - 1.030   pH 6.0  5.0 - 8.0   Glucose, UA >1000 (*) NEGATIVE mg/dL   Hgb urine dipstick NEGATIVE  NEGATIVE   Bilirubin Urine NEGATIVE  NEGATIVE   Ketones, ur NEGATIVE  NEGATIVE mg/dL   Protein, ur NEGATIVE  NEGATIVE mg/dL   Urobilinogen, UA 0.2  0.0 - 1.0 mg/dL   Nitrite NEGATIVE  NEGATIVE   Leukocytes, UA NEGATIVE  NEGATIVE  URINE MICROSCOPIC-ADD ON     Status: Normal   Collection Time   11/26/11 12:30 PM      Component Value Range   WBC, UA 0-2  <3 WBC/hpf   RBC / HPF    <3 RBC/hpf   Value: NO FORMED ELEMENTS SEEN ON URINE MICROSCOPIC EXAMINATION  POCT PREGNANCY, URINE     Status: Normal   Collection Time   11/26/11  1:10 PM      Component Value Range   Preg Test, Ur NEGATIVE  NEGATIVE  CBC     Status: Abnormal   Collection Time   11/26/11  1:20 PM      Component Value Range   WBC 3.6 (*) 4.0 - 10.5 K/uL   RBC 5.32 (*) 3.87 - 5.11 MIL/uL   Hemoglobin 12.1  12.0 - 15.0 g/dL   HCT 16.1  09.6 - 04.5 %   MCV 72.0 (*) 78.0 - 100.0 fL   MCH 22.7 (*) 26.0 - 34.0 pg   MCHC 31.6  30.0 - 36.0 g/dL   RDW 40.9  81.1 - 91.4 %   Platelets 208  150 - 400 K/uL  WET PREP, GENITAL     Status: Abnormal   Collection Time   11/26/11  2:05 PM      Component Value Range   Yeast Wet Prep HPF POC NONE SEEN  NONE SEEN   Trich, Wet Prep NONE SEEN  NONE SEEN   Clue Cells Wet Prep HPF POC FEW (*) NONE SEEN   WBC, Wet Prep HPF POC FEW (*) NONE SEEN   US Transvaginal Non-ob  11/26/2011  *RADIOLOGY REPORT*  Clinical Data: Left adnexal pain bilateral tubal ligation, history of C-section.  TRANSABDOMINAL AND TRANSVAGINAL ULTRASOUND OF PELVIS Technique:  Both transabdominal  and transvaginal ultrasound examinations of the pelvis were performed. Transabdominal technique was performed for global imaging of the pelvis including uterus, ovaries, adnexal  regions, and pelvic cul-de-sac.  It was necessary to proceed with endovaginal exam following the transabdominal exam to visualize the ovaries.  Comparison:  None  Findings:  Uterus: Measures 9.1 x 5 x 6.2 cm.  Mild heterogeneous echogenicity without discrete fibroid noted.  Endometrium: Normal in thickness and appearance.  Measures 5.8 mm thickness within normal limits.  Right ovary:  Normal appearance/no adnexal mass.  Measures 3.4 x 2.2 x 2.4 cm.  Left ovary: Normal appearance/no adnexal mass.  Measures 3.7 x 2.6 x 2.4 cm.  Other findings: No free fluid  IMPRESSION:  Normal study. No evidence of pelvic mass or other significant abnormality.   Original Report Authenticated By: Natasha Mead, M.D.    US Pelvis Complete  11/26/2011  *RADIOLOGY REPORT*  Clinical Data: Left adnexal pain bilateral tubal ligation, history of C-section.  TRANSABDOMINAL AND TRANSVAGINAL ULTRASOUND OF PELVIS Technique:  Both transabdominal and transvaginal ultrasound examinations of the pelvis were performed. Transabdominal technique was performed for global imaging of the pelvis including uterus, ovaries, adnexal regions, and pelvic cul-de-sac.  It was necessary to proceed with endovaginal exam following the transabdominal exam to visualize the ovaries.  Comparison:  None  Findings:  Uterus: Measures 9.1 x 5 x 6.2 cm.  Mild heterogeneous echogenicity without discrete fibroid noted.  Endometrium: Normal in thickness and appearance.  Measures 5.8 mm thickness within normal limits.  Right ovary:  Normal appearance/no adnexal mass.  Measures 3.4 x 2.2 x 2.4 cm.  Left ovary: Normal appearance/no adnexal mass.  Measures 3.7 x 2.6 x 2.4 cm.  Other findings: No free fluid  IMPRESSION:  Normal study. No evidence of pelvic mass or other significant abnormality.   Original  Report Authenticated By: Natasha Mead, M.D.     Assessment and Plan   1. Pelvic pain in female   2. Bacterial vaginosis      Medication List     As of 11/26/2011  3:31 PM    START taking these medications         metroNIDAZOLE 500 MG tablet   Commonly known as: FLAGYL   Take 1 tablet (500 mg total) by mouth 2 (two) times daily.      CONTINUE taking these medications         accu-chek multiclix lancets      albuterol 90 MCG/ACT inhaler   Commonly known as: PROVENTIL,VENTOLIN      amitriptyline 25 MG tablet   Commonly known as: ELAVIL   Take 1 tablet (25 mg total) by mouth at bedtime.      diclofenac sodium 1 % Gel   Commonly known as: VOLTAREN      glucose blood test strip   1 each by Other route 3 (three) times daily. Dx code: 45.62      HYDROcodone-acetaminophen 5-500 MG per tablet   Commonly known as: VICODIN      insulin glargine 100 UNIT/ML injection   Commonly known as: LANTUS   Inject 40 Units into the skin at bedtime.      Insulin Pen Needle 31G X 5 MM Misc   Use to inject lantus insulin once daily in the morning      meloxicam 7.5 MG tablet   Commonly known as: MOBIC          Where to get your medications    These are the prescriptions that you need to pick up. We sent them to a specific pharmacy, so you will need to go there to get them.  [id number] - [place], [place] - [address] AT [facility] [address]    [address] [place] [address]    Phone: [phone]        metroNIDAZOLE 500 MG tablet           Follow up PRN  Akeelah Seppala 11/26/2011, 1:34 PM

## 2011-11-26 NOTE — MAU Note (Signed)
Pt reports having LLQ (groin pain) that is radiating towards her URQ.

## 2011-11-27 LAB — GC/CHLAMYDIA PROBE AMP, GENITAL
Chlamydia, DNA Probe: NEGATIVE
GC Probe Amp, Genital: NEGATIVE

## 2011-12-05 NOTE — MAU Provider Note (Signed)
Attestation of Attending Supervision of Advanced Practitioner: Evaluation and management procedures were performed by the PA/NP/CNM/OB Fellow under my supervision/collaboration. Chart reviewed and agree with management and plan.  Gabi Mcfate V 12/05/2011 3:41 PM

## 2012-02-07 ENCOUNTER — Encounter: Payer: Self-pay | Admitting: Radiation Oncology

## 2012-02-07 ENCOUNTER — Ambulatory Visit (INDEPENDENT_AMBULATORY_CARE_PROVIDER_SITE_OTHER): Payer: Self-pay | Admitting: Radiation Oncology

## 2012-02-07 VITALS — BP 116/73 | HR 89 | Temp 97.5°F | Ht 63.0 in | Wt 155.3 lb

## 2012-02-07 DIAGNOSIS — G909 Disorder of the autonomic nervous system, unspecified: Secondary | ICD-10-CM

## 2012-02-07 DIAGNOSIS — Z79899 Other long term (current) drug therapy: Secondary | ICD-10-CM

## 2012-02-07 DIAGNOSIS — Z299 Encounter for prophylactic measures, unspecified: Secondary | ICD-10-CM

## 2012-02-07 DIAGNOSIS — N760 Acute vaginitis: Secondary | ICD-10-CM

## 2012-02-07 DIAGNOSIS — A499 Bacterial infection, unspecified: Secondary | ICD-10-CM

## 2012-02-07 DIAGNOSIS — B9689 Other specified bacterial agents as the cause of diseases classified elsewhere: Secondary | ICD-10-CM

## 2012-02-07 DIAGNOSIS — N766 Ulceration of vulva: Secondary | ICD-10-CM

## 2012-02-07 DIAGNOSIS — IMO0002 Reserved for concepts with insufficient information to code with codable children: Secondary | ICD-10-CM

## 2012-02-07 DIAGNOSIS — E1149 Type 2 diabetes mellitus with other diabetic neurological complication: Secondary | ICD-10-CM

## 2012-02-07 DIAGNOSIS — E1143 Type 2 diabetes mellitus with diabetic autonomic (poly)neuropathy: Secondary | ICD-10-CM

## 2012-02-07 DIAGNOSIS — Z23 Encounter for immunization: Secondary | ICD-10-CM

## 2012-02-07 LAB — GLUCOSE, CAPILLARY

## 2012-02-07 LAB — POCT URINE PREGNANCY: Preg Test, Ur: NEGATIVE

## 2012-02-07 MED ORDER — HYDROCORTISONE 2.5 % EX LOTN: TOPICAL_LOTION | CUTANEOUS | Status: AC

## 2012-02-07 NOTE — Assessment & Plan Note (Signed)
She has generalized chronic pain.  She has several myofascial areas that are contributing but also her mood, and sleep pattern could be contributing as well.  I encouraged her to stay active and we will do a trial of Amitriptyline to see if that helps her.

## 2012-02-07 NOTE — Progress Notes (Signed)
  Subjective:    [name], female    DOB: 11-18-1970, 41 y.o.   MRN: [id number]  HPI Pt is a 41 yo woman with a history of poorly-controlled DM, bacterial vaginosis who presents with complaints of vaginal itching/burning. Pt states she was seen for this issue approximately one month prior at [facility], and was diagnosed with BV at that time. Pt states she was prescribed metronidazole, which temporarily resolved the symptoms, however the vaginal itching resumed (although she had previously also had discharge and vaginal odor, which did not return). Pt states she also believes that she had a yeast infection.   Review of Systems  Constitutional: Negative for fever and chills.  HENT: Negative.   Respiratory: Negative for cough, shortness of breath and wheezing.   Cardiovascular: Negative for chest pain and leg swelling.  Gastrointestinal: Negative for nausea, vomiting, abdominal pain, diarrhea and blood in stool.  Genitourinary: Positive for frequency and vaginal pain. Negative for dysuria, urgency, hematuria, flank pain, vaginal bleeding, vaginal discharge and difficulty urinating.  Skin: Negative.   Neurological: Negative for light-headedness and headaches.  Hematological: Negative.   Psychiatric/Behavioral: Negative.        Objective:   Physical Exam  Constitutional: She is oriented to person, place, and time. She appears well-developed and well-nourished. No distress.  HENT:  Head: Normocephalic and atraumatic.  Eyes: Conjunctivae normal are normal. Pupils are equal, round, and reactive to light. No scleral icterus.  Neck: Normal range of motion. Neck supple. No tracheal deviation present. No thyromegaly present.  Cardiovascular: Normal rate and regular rhythm.   No murmur heard. Pulmonary/Chest: Effort normal. She has no wheezes. She has no rales.  Abdominal: Soft. Bowel sounds are normal. She exhibits no distension. There is no tenderness.  Genitourinary:           Discrete, shallow ulcers posterior to L labia majora. No other external abnormalities.   Musculoskeletal: Normal range of motion. She exhibits no edema.  Neurological: She is alert and oriented to person, place, and time. No cranial nerve deficit.  Psychiatric: She has a normal mood and affect. Her behavior is normal.          Assessment & Plan:

## 2012-02-07 NOTE — Assessment & Plan Note (Signed)
She continues with generalized chronic pain.  I agree with Dr. Nickola Major that there are likely several contributing factors to her pain.  She was evaluated at [facility] and they do not believe that she has RA at this time.  We will add amitriptyline for fibromyalgia treatment and I encouraged her to consider PT for her IT band tightness.

## 2012-02-07 NOTE — Assessment & Plan Note (Addendum)
Affect is very flat today and she states that she feels overwhelmed.  I encouraged good sleep hygiene today and we discussed addition of treatment for depression but she refused at this time.  We will continue to monitor.  She denies SI and HI today.

## 2012-02-07 NOTE — Assessment & Plan Note (Signed)
Lab Results  Component Value Date   HGBA1C 13.6 09/06/2011   HGBA1C >14.0 03/29/2011   Lab Results  Component Value Date   MICROALBUR 20.31* 07/24/2008   LDLCALC 98 03/29/2011   CREATININE 0.85 03/29/2011   Her A1C is not due at this time yet but is grossly out of control.  She already has some symptoms consistent with neuropathy and needs better control of her symptoms.  I encouraged her to take her lantus every day and also discussed meeting with Lupita Leash Plyler our Diabetes educator to help her with better dietary choices.  We will have her return in October when her A1c is due again.

## 2012-02-08 DIAGNOSIS — N766 Ulceration of vulva: Secondary | ICD-10-CM | POA: Insufficient documentation

## 2012-02-08 LAB — URINALYSIS, ROUTINE W REFLEX MICROSCOPIC
Bilirubin Urine: NEGATIVE
Glucose, UA: >1000 mg/dL — AB
Leukocytes, UA: NEGATIVE
Protein, ur: NEGATIVE mg/dL
Specific Gravity, Urine: >1.03 — ABNORMAL HIGH (ref 1.005–1.030)
Urobilinogen, UA: 0.2 mg/dL (ref 0.0–1.0)

## 2012-02-08 LAB — URINALYSIS, MICROSCOPIC ONLY
Bacteria, UA: NONE SEEN
Crystals: NONE SEEN

## 2012-02-08 NOTE — Assessment & Plan Note (Addendum)
Pt's glycemic control is worsening, as evidenced by A1c >14 at time of visit. Pt states she uses her lantus irregularly (2-3x/week), in part because she states she cannot afford to use it daily. It does seem likely however that non-compliance is largely contributed to by lack of motivation to treat her DM, as she admits she only checks her cbg once per week at best. Importance of compliance with insulin regimen was stressed during her visit.   - cont current regimen - reassess compliance at next f/u visit - see diabetic educator Lupita Leash) at next visit (not available during this visit)  Hemoglobin A1C  Date Value Range Status  02/07/2012 >14.0   Final     CBG Critical High 513.  Results reported to [employer], RN by Lise Auer, PBT [id number] 02-07-12  09/06/2011 13.6   Final  03/29/2011 >14.0   Final     CBG 552, Results given to Angelina Ok, RN 03-29-11 at 1115a, Alric Quan, PBT     Assessment:  Diabetes control: poor control (HgbA1C >9%)  Progress toward A1C goal:  deteriorated  Comments:   Plan:  Medications:  continue current medications  Home glucose monitoring:   Frequency: 3 times a day   Timing: before meals  Instruction/counseling given: other instruction/counseling: use insulin daily as prescribed  Educational resources provided: brochure;handout  Self management tools provided:    Other plans:

## 2012-02-09 LAB — HERPES SIMPLEX VIRUS CULTURE: Organism ID, Bacteria: DETECTED

## 2012-02-09 MED ORDER — VALACYCLOVIR HCL 500 MG PO TABS: 500.0000 mg | ORAL_TABLET | Freq: Two times a day (BID) | ORAL | Status: AC

## 2012-02-09 MED ORDER — METRONIDAZOLE 500 MG PO TABS: 500.0000 mg | ORAL_TABLET | Freq: Two times a day (BID) | ORAL | Status: AC

## 2012-02-09 NOTE — Addendum Note (Signed)
Addended by: Elenor Legato R on: 02/09/2012 01:56 PM   Modules accepted: Orders

## 2012-02-09 NOTE — Addendum Note (Signed)
Addended by: Elenor Legato R on: 02/09/2012 04:01 PM   Modules accepted: Orders

## 2012-02-12 ENCOUNTER — Emergency Department (HOSPITAL_COMMUNITY): Payer: Self-pay

## 2012-02-12 ENCOUNTER — Emergency Department (HOSPITAL_COMMUNITY)
Admission: EM | Admit: 2012-02-12 | Discharge: 2012-02-13 | Disposition: A | Discharge status: Home / Self Care | Payer: Self-pay | Attending: Emergency Medicine | Admitting: Emergency Medicine

## 2012-02-12 ENCOUNTER — Encounter (HOSPITAL_COMMUNITY): Payer: Self-pay | Admitting: *Deleted

## 2012-02-12 DIAGNOSIS — E785 Hyperlipidemia, unspecified: Secondary | ICD-10-CM | POA: Insufficient documentation

## 2012-02-12 DIAGNOSIS — J45909 Unspecified asthma, uncomplicated: Secondary | ICD-10-CM | POA: Insufficient documentation

## 2012-02-12 DIAGNOSIS — F329 Major depressive disorder, single episode, unspecified: Secondary | ICD-10-CM | POA: Insufficient documentation

## 2012-02-12 DIAGNOSIS — Z8781 Personal history of (healed) traumatic fracture: Secondary | ICD-10-CM | POA: Insufficient documentation

## 2012-02-12 DIAGNOSIS — M069 Rheumatoid arthritis, unspecified: Secondary | ICD-10-CM | POA: Insufficient documentation

## 2012-02-12 DIAGNOSIS — Z79899 Other long term (current) drug therapy: Secondary | ICD-10-CM | POA: Insufficient documentation

## 2012-02-12 DIAGNOSIS — R739 Hyperglycemia, unspecified: Secondary | ICD-10-CM

## 2012-02-12 DIAGNOSIS — E669 Obesity, unspecified: Secondary | ICD-10-CM | POA: Insufficient documentation

## 2012-02-12 DIAGNOSIS — E1169 Type 2 diabetes mellitus with other specified complication: Secondary | ICD-10-CM | POA: Insufficient documentation

## 2012-02-12 DIAGNOSIS — IMO0001 Reserved for inherently not codable concepts without codable children: Secondary | ICD-10-CM | POA: Insufficient documentation

## 2012-02-12 DIAGNOSIS — G609 Hereditary and idiopathic neuropathy, unspecified: Secondary | ICD-10-CM | POA: Insufficient documentation

## 2012-02-12 DIAGNOSIS — Z794 Long term (current) use of insulin: Secondary | ICD-10-CM | POA: Insufficient documentation

## 2012-02-12 DIAGNOSIS — F3289 Other specified depressive episodes: Secondary | ICD-10-CM | POA: Insufficient documentation

## 2012-02-12 LAB — BASIC METABOLIC PANEL
BUN: 9 mg/dL (ref 6–23)
Chloride: 96 mEq/L (ref 96–112)
Creatinine, Ser: 0.77 mg/dL (ref 0.50–1.10)
GFR calc Af Amer: >90 mL/min (ref >90)
Glucose, Bld: 353 mg/dL — ABNORMAL HIGH (ref 70–99)
Potassium: 3.6 mEq/L (ref 3.5–5.1)

## 2012-02-12 LAB — GLUCOSE, CAPILLARY: Glucose-Capillary: 197 mg/dL — ABNORMAL HIGH (ref 70–99)

## 2012-02-12 LAB — CBC WITH DIFFERENTIAL/PLATELET
Basophils Absolute: 0 10*3/uL (ref 0.0–0.1)
Basophils Relative: 1 % (ref 0–1)
Eosinophils Absolute: 0.1 10*3/uL (ref 0.0–0.7)
HCT: 40.4 % (ref 36.0–46.0)
Hemoglobin: 13.4 g/dL (ref 12.0–15.0)
Lymphs Abs: 1.4 10*3/uL (ref 0.7–4.0)
MCHC: 33.2 g/dL (ref 30.0–36.0)
MCV: 69.9 fL — ABNORMAL LOW (ref 78.0–100.0)
Neutro Abs: 2.8 10*3/uL (ref 1.7–7.7)
RDW: 14.4 % (ref 11.5–15.5)

## 2012-02-12 LAB — POCT I-STAT TROPONIN I: Troponin i, poc: 0 ng/mL (ref 0.00–0.08)

## 2012-02-12 MED ORDER — INSULIN GLARGINE 100 UNIT/ML ~~LOC~~ SOLN
45.0000 [IU] | Freq: Once | SUBCUTANEOUS | Status: AC
  Administered 2012-02-12: 45 [IU] via SUBCUTANEOUS
  Filled 2012-02-12: qty 1

## 2012-02-12 MED ORDER — SODIUM CHLORIDE 0.9 % IV BOLUS (SEPSIS)
1000.0000 mL | Freq: Once | INTRAVENOUS | Status: AC
  Administered 2012-02-12: 1000 mL via INTRAVENOUS

## 2012-02-12 MED ORDER — KETOROLAC TROMETHAMINE 30 MG/ML IJ SOLN
30.0000 mg | Freq: Once | INTRAMUSCULAR | Status: AC
  Administered 2012-02-12: 30 mg via INTRAVENOUS
  Filled 2012-02-12: qty 1

## 2012-02-12 NOTE — ED Notes (Signed)
Pt c/o right sided CP worse with inspiration x 3 days

## 2012-02-12 NOTE — ED Provider Notes (Signed)
History     CSN: [id number]  Arrival date & time 02/12/12  1731   First MD Initiated Contact with Patient 02/12/12 2013      Chief Complaint  Patient presents with  . Chest Pain    (Consider location/radiation/quality/duration/timing/severity/associated sxs/prior treatment) HPI Comments: Is a 41 year old F. recommended female, who is insulin-dependent diabetic, not taking her insulin for the past 3, days.  Due to financial concerns.  Also, reports for the last 3, days, that she's had right sided chest discomfort, worse with a deep breath.  She states she been taking over-the-counter Tylenol or ibuprofen, without any relief of her discomfort.  She has not contacted her primary care physician  Patient is a 41 y.o. female presenting with chest pain. The history is provided by the patient.  Chest Pain The chest pain began 3 - 5 days ago. Chest pain occurs constantly. The chest pain is unchanged. The pain is associated with breathing. At its most intense, the pain is at 6/10. The pain is currently at 6/10. The severity of the pain is moderate. The quality of the pain is described as pressure-like. The pain does not radiate. Pertinent negatives for primary symptoms include no fever, no shortness of breath, no nausea, no vomiting and no dizziness.  Pertinent negatives for associated symptoms include no numbness and no weakness.     Past Medical History  Diagnosis Date  . Peripheral neuropathy   . Fibromyalgia   . Episodic recurrent vertigo   . Vitamin D deficiency     history of  . Hyperlipidemia   . Diabetes mellitus   . Chronic rhinitis   . Depression   . Asthma   . Tinnitus     history of, Evaluated by ENT> [place]  . Rheumatoid arthritis     seen by Rheumatology in [facility], Rheumatoid Factor pos, Anti CCP pos in 2005 -? early presentation of RF  . Obesity   . History of hip fracture     hx childhood hip fracture    Past Surgical History  Procedure Date  . Cesarean  section     Family History  Problem Relation Age of Onset  . Hypertension Mother   . Diabetes Maternal Grandmother   . Arthritis Maternal Grandmother   . Kidney disease Maternal Grandmother     History  Substance Use Topics  . Smoking status: Never Smoker   . Smokeless tobacco: Not on file  . Alcohol Use: Yes     Comment: ccasionally    OB History    Grav Para Term Preterm Abortions TAB SAB Ect Mult Living   6 6 6      1 6       Review of Systems  Constitutional: Negative for fever and chills.  HENT: Negative for congestion, sore throat, rhinorrhea and trouble swallowing.   Respiratory: Negative for shortness of breath.   Cardiovascular: Positive for chest pain.  Gastrointestinal: Negative for nausea and vomiting.  Genitourinary: Negative.   Musculoskeletal: Negative for back pain.  Neurological: Negative for dizziness, weakness and numbness.    Allergies  Review of patient's allergies indicates no known allergies.  Home Medications   Current Outpatient Rx  Name  Route  Sig  Dispense  Refill  . ALBUTEROL 90 MCG/ACT IN AERS      2 puffs 4 (four) times daily as needed. For shortness of breath.          . AMITRIPTYLINE HCL 25 MG PO TABS   Oral  Take 1 tablet (25 mg total) by mouth at bedtime.   30 tablet   1   . DICLOFENAC SODIUM 1 % TD GEL   Topical   Apply 1 application topically 3 (three) times daily.         Marland Kitchen GLUCOSE BLOOD VI STRP   Other   1 each by Other route 3 (three) times daily. Dx code: 250.62   100 each   6   . HYDROCODONE-ACETAMINOPHEN 5-500 MG PO TABS   Oral   Take 0.5 tablets by mouth every 6 (six) hours as needed. pain         . HYDROCORTISONE 2.5 % EX LOTN      Apply to affected area 2 times daily   60 mL   1   . INSULIN GLARGINE 100 UNIT/ML [place] SOLN   Subcutaneous   Inject 40 Units into the skin at bedtime.   10 mL   6     Dx: 250.62   . INSULIN PEN NEEDLE 31G X 5 MM MISC      Use to inject lantus insulin once  daily in the morning   100 each   6     Dx: 250.62   . ACCU-CHEK MULTICLIX LANCETS MISC      Use to test blood sugar 3 times a day          . MELOXICAM 7.5 MG PO TABS   Oral   Take 7.5 mg by mouth daily.         Marland Kitchen METRONIDAZOLE 500 MG PO TABS   Oral   Take 1 tablet (500 mg total) by mouth 2 (two) times daily.   21 tablet   0   . VALACYCLOVIR HCL 500 MG PO TABS   Oral   Take 1 tablet (500 mg total) by mouth 2 (two) times daily.   20 tablet   0   . INSULIN GLARGINE 100 UNIT/ML [place] SOLN   Subcutaneous   Inject 45 Units into the skin at bedtime.   10 mL   12     BP 120/72  Pulse 79  Temp 98.8 F (37.1 C) (Oral)  Resp 17  SpO2 100%  LMP 01/15/2012  Physical Exam  Constitutional: She is oriented to person, place, and time. She appears well-developed.  HENT:  Head: Normocephalic.  Eyes: Pupils are equal, round, and reactive to light.  Neck: Normal range of motion.  Cardiovascular: Normal rate.   Pulmonary/Chest: Effort normal. No respiratory distress.  Abdominal: Soft.  Musculoskeletal: Normal range of motion. She exhibits tenderness. She exhibits no edema.  Neurological: She is alert and oriented to person, place, and time.    ED Course  Procedures (including critical care time)  Labs Reviewed  CBC WITH DIFFERENTIAL - Abnormal; Notable for the following:    RBC 5.78 (*)     MCV 69.9 (*)     MCH 23.2 (*)     All other components within normal limits  BASIC METABOLIC PANEL - Abnormal; Notable for the following:    Sodium 133 (*)     Glucose, Bld 353 (*)     All other components within normal limits  GLUCOSE, CAPILLARY - Abnormal; Notable for the following:    Glucose-Capillary 344 (*)     All other components within normal limits  GLUCOSE, CAPILLARY - Abnormal; Notable for the following:    Glucose-Capillary 244 (*)     All other components within normal limits  GLUCOSE, CAPILLARY -  Abnormal; Notable for the following:    Glucose-Capillary 197 (*)      All other components within normal limits  POCT I-STAT TROPONIN I   Dg Chest 2 View  02/12/2012  *RADIOLOGY REPORT*  Clinical Data: Right-sided chest pain and weakness for 3 days.  CHEST - 2 VIEW  Comparison: 05/01/2010.  Findings:  Cardiopericardial silhouette within normal limits. Mediastinal contours normal. Trachea midline.  No airspace disease or effusion.  IMPRESSION: Negative two-view chest.   Original Report Authenticated By: Andreas Newport, M.D.    ED ECG REPORT   Date: 02/13/2012  EKG Time: 12:17 AM  Rate: 88  Rhythm: normal sinus rhythm,  normal EKG, normal sinus rhythm, there are no previous tracings available for comparison  Axis: normal  Intervals:none  ST&T Change: non specific T wave abnormalities  Narrative Interpretation: abnormal            1. Hyperglycemia without ketosis       MDM   Will treat elevated glucose and reevaluate   BS 197 at time of DC patient has a PCP at Out Patient clinic  I have encouraged her to FU this weeks       Arman Filter, NP 02/13/12 2113

## 2012-02-13 MED ORDER — INSULIN GLARGINE 100 UNIT/ML ~~LOC~~ SOLN: 45.0000 [IU] | Freq: Every day | SUBCUTANEOUS | Status: DC

## 2012-02-13 NOTE — ED Notes (Signed)
The patient is AOx4 and comfortable with her discharge instructions. 

## 2012-02-14 NOTE — ED Provider Notes (Signed)
Medical screening examination/treatment/procedure(s) were conducted as a shared visit with non-physician practitioner(s) and myself.  I personally evaluated the patient during the encounter  Type 1 diabetic, noncompliant. R sided chest pain, intermittent x 3 days, pleuritic.  TTP on palpation. Lungs clear.  eKG nonischemic.  PERC negative.  Glynn Octave, MD 02/14/12 [phone]

## 2012-03-11 NOTE — Addendum Note (Signed)
Addended by: Neomia Dear on: 03/11/2012 03:56 PM   Modules accepted: Orders

## 2012-05-15 ENCOUNTER — Telehealth: Payer: Self-pay | Admitting: *Deleted

## 2012-05-15 ENCOUNTER — Ambulatory Visit (INDEPENDENT_AMBULATORY_CARE_PROVIDER_SITE_OTHER): Payer: Self-pay | Admitting: Internal Medicine

## 2012-05-15 ENCOUNTER — Encounter: Payer: Self-pay | Admitting: Internal Medicine

## 2012-05-15 VITALS — BP 112/73 | HR 81 | Temp 98.2°F | Ht 64.0 in | Wt 155.3 lb

## 2012-05-15 DIAGNOSIS — G909 Disorder of the autonomic nervous system, unspecified: Secondary | ICD-10-CM

## 2012-05-15 DIAGNOSIS — E1149 Type 2 diabetes mellitus with other diabetic neurological complication: Secondary | ICD-10-CM

## 2012-05-15 DIAGNOSIS — IMO0002 Reserved for concepts with insufficient information to code with codable children: Secondary | ICD-10-CM

## 2012-05-15 DIAGNOSIS — E1143 Type 2 diabetes mellitus with diabetic autonomic (poly)neuropathy: Secondary | ICD-10-CM

## 2012-05-15 DIAGNOSIS — N611 Abscess of the breast and nipple: Secondary | ICD-10-CM | POA: Insufficient documentation

## 2012-05-15 DIAGNOSIS — N61 Mastitis without abscess: Secondary | ICD-10-CM

## 2012-05-15 LAB — GLUCOSE, CAPILLARY

## 2012-05-15 MED ORDER — SULFAMETHOXAZOLE-TRIMETHOPRIM 800-160 MG PO TABS: 2.0000 | ORAL_TABLET | Freq: Two times a day (BID) | ORAL | Status: AC

## 2012-05-15 MED ORDER — INSULIN NPH (HUMAN) (ISOPHANE) 100 UNIT/ML ~~LOC~~ SUSP: 20.0000 [IU] | Freq: Two times a day (BID) | SUBCUTANEOUS | Status: DC

## 2012-05-15 NOTE — Progress Notes (Signed)
[name], female   DOB: [birth year], 42 y.o.   MRN: [id number]  Subjective:   [name] female   DOB: 04/11/1970 42 y.o.   MRN: [id number]  HPI: [name] is a 42 y.o. 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  Patient reports a one-week history of a painful, red, and swollen knot in the inferior aspect of her right breast. The lesion was warm and tender to the touch and over the past week drained purulent material.she says that after the area started draining, the pain and swelling improved. She says over the past couple of days, she's no longer had any drainage but the area is still swollen and slightly tender to touch.  She's never had any breast swelling or irregularities in the past. She occasionally gets small abscesses in her inguinal region around ingrown hairs. She reports that she has been without insulin for 3 weeks because she cannot afford Lantus after losing her job and being uninsured. She's had a recent visit to the ED for hyperglycemia. She denies any fevers, chills,lymphadenopathy, nausea, vomiting, abdominal pain, dizziness, headache.   Past Medical History  Diagnosis Date  . Peripheral neuropathy   . Fibromyalgia   . Episodic recurrent vertigo   . Vitamin D deficiency     history of  . Hyperlipidemia   . Diabetes mellitus   . Chronic rhinitis   . Depression   . Asthma   . Tinnitus     history of, Evaluated by ENT> [place]  . Rheumatoid arthritis     seen by Rheumatology in [facility], Rheumatoid Factor pos, Anti CCP pos in 2005 -? early presentation of RF  . Obesity   . History of hip fracture     hx childhood hip fracture   Current Outpatient Prescriptions  Medication Sig Dispense Refill  . albuterol (PROVENTIL,VENTOLIN) 90 MCG/ACT inhaler 2 puffs 4 (four) times daily as needed. For shortness of breath.       Marland Kitchen  amitriptyline (ELAVIL) 25 MG tablet Take 1 tablet (25 mg total) by mouth at bedtime.  30 tablet  1  . diclofenac sodium (VOLTAREN) 1 % GEL Apply 1 application topically 3 (three) times daily.      Marland Kitchen glucose blood (ACCU-CHEK AVIVA PLUS) test strip 1 each by Other route 3 (three) times daily. Dx code: 250.62  100 each  6  . HYDROcodone-acetaminophen (VICODIN) 5-500 MG per tablet Take 0.5 tablets by mouth every 6 (six) hours as needed. pain      . hydrocortisone 2.5 % lotion Apply to affected area 2 times daily  60 mL  1  . insulin NPH (HUMULIN N) 100 UNIT/ML injection Inject 20 Units into the skin 2 (two) times daily before a meal.  10 mL  3  . Insulin Pen Needle (PRODIGY MINI PEN NEEDLES) 31G X 5 MM MISC Use to inject lantus insulin once daily in the morning  100 each  6  . Lancets (ACCU-CHEK MULTICLIX) lancets Use to test blood sugar 3 times a day       . meloxicam (MOBIC) 7.5 MG tablet Take 7.5 mg by mouth daily.      Marland Kitchen sulfamethoxazole-trimethoprim (BACTRIM DS) 800-160 MG per tablet Take 2 tablets by mouth 2 (two) times daily.  40 tablet  0   No current facility-administered medications for this visit.   Family History  Problem Relation Age of Onset  .  Hypertension Mother   . Diabetes Maternal Grandmother   . Arthritis Maternal Grandmother   . Kidney disease Maternal Grandmother    History   Social History  . Marital Status: Legally Separated    Spouse Name: N/A    Number of Children: N/A  . Years of Education: N/A   Social History Main Topics  . Smoking status: Never Smoker   . Smokeless tobacco: None  . Alcohol Use: Yes     Comment: ccasionally  . Drug Use: No  . Sexually Active: None   Other Topics Concern  . None   Social History Narrative   [phone]   Review of Systems: 10 pt ROS performed, pertinent positives and negatives noted in HPI   Objective:  Physical Exam: Filed Vitals:   05/15/12 1019  BP: 112/73  Pulse: 81  Temp: 98.2 F (36.8 C)  TempSrc: Oral   Height: 5\' 4"  (1.626 m)  Weight: 155 lb 4.8 oz (70.444 kg)  SpO2: 97%   Vitals reviewed. General: sitting up on bed, NAD HEENT: PERRL, EOMI, no scleral icterus Cardiac: RRR, no rubs, murmurs or gallops Pulm: clear to auscultation bilaterally, no wheezes, rales, or rhonchi Abd: soft, nontender, nondistended, BS present Neuro: alert and oriented X3, cranial nerves II-XII grossly intact, strength and sensation to light touch equal in bilateral upper and lower extremities Chest: R breast with 3cmx3cm erythematous, indurated nodule inferomedially to areola. No abscess/fluctuance/drainage. No tethering to underlying breast tissue, no nipple changes or discharge. No lumps palpated in remainder of breast or contralateral breast.    Assessment & Plan:

## 2012-05-15 NOTE — Telephone Encounter (Signed)
Pt calls and states she has area on R breast, swollen, tender to touch, red has started to drain, noticed 3+ days ago, would like appt. appt dr ziemer at [phone]

## 2012-05-15 NOTE — Patient Instructions (Addendum)
1. Take an antibiotic: Bactrim 2 tablets TWICE a day for 10 days 2. I will give you one sample lantus pen today. Inject 40 units at nighttime. It will only last about a week. After that, you need to go to your pharmacy and pick up the insulin prescription that I sent in for you, which is called NPH insulin. You will have to inject this insulin into the skin TWICE A DAY. Inject 20 Units each with breakfast and dinner. 3. Come back and see me in 2 weeks

## 2012-05-15 NOTE — Assessment & Plan Note (Addendum)
Patient has what appears to be resolving soft tissue abscess on her right breast inferomedially to the areola. Patient reports over the past week that area drained pus, however no abscess or drainage evident on today's exam. Suspect uncontrolled hyperglycemia as contributing to skin infections. -Will prescribe Bactrim 2 double strength tablets twice daily for 10 days. Patient denies sulfa allergy, reports no chance that she could be pregnant currently. -Return to the clinic in 2 weeks to look for resolution. I do not think that there is an underlying breast lump or breast tissue process, as this appears to be completely confined to skin tissue.

## 2012-05-15 NOTE — Assessment & Plan Note (Signed)
Lab Results  Component Value Date   HGBA1C >14.0 05/15/2012   HGBA1C 10.2 01/18/2010   CREATININE 0.77 02/12/2012   CREATININE 0.85 03/29/2011   MICROALBUR 20.31* 07/24/2008   MICRALBCREAT 214.7* 07/24/2008   CHOL 180 03/29/2011   HDL 37* 03/29/2011   TRIG 224* 03/29/2011   Assessment: Diabetes control: not controlled Progress toward goals: deteriorated Barriers to meeting goals: lack of insurance, financial need and nonadherence to medications  Plan: Diabetes treatment: Patient unable to afford Lantus due to job loss and uninsured status. She has been 3 weeks without any insulin as blood sugar 400 here.  Gave her one Lantus pen sample and instructed her to continue to take 40 units at night. When the pain runs out in a week's time, instructed her to pick up prescription for NPH insulin 20 units in the morning and 20 units at night which was sent to her pharmacy. Refer to: Patient has met with Norm Parcel in the past. Is not interested in meeting to discuss disease management. Acknowledges that her disease is poorly controlled due to medication noncompliance. Instruction/counseling given: other instruction/counseling: Counseled on importance of insulin therapy and the consequences of uncontrolled hyperglycemia in the short-term and long-term. Patient will return to the clinic to see me in 2 weeks for followup.

## 2012-05-29 ENCOUNTER — Ambulatory Visit: Payer: Self-pay | Admitting: Internal Medicine

## 2012-06-12 ENCOUNTER — Encounter: Payer: Self-pay | Admitting: Internal Medicine

## 2012-06-12 DIAGNOSIS — R269 Unspecified abnormalities of gait and mobility: Secondary | ICD-10-CM | POA: Insufficient documentation

## 2012-08-22 ENCOUNTER — Encounter: Payer: Self-pay | Admitting: Internal Medicine

## 2012-08-22 ENCOUNTER — Other Ambulatory Visit: Payer: Self-pay | Admitting: Internal Medicine

## 2012-08-22 ENCOUNTER — Telehealth: Payer: Self-pay | Admitting: Dietician

## 2012-08-22 DIAGNOSIS — E1129 Type 2 diabetes mellitus with other diabetic kidney complication: Secondary | ICD-10-CM

## 2012-08-22 DIAGNOSIS — E1149 Type 2 diabetes mellitus with other diabetic neurological complication: Secondary | ICD-10-CM

## 2012-08-22 MED ORDER — INSULIN NPH (HUMAN) (ISOPHANE) 100 UNIT/ML ~~LOC~~ SUSP: 20.0000 [IU] | Freq: Two times a day (BID) | SUBCUTANEOUS | Status: DC

## 2012-08-22 NOTE — Addendum Note (Signed)
Addended by: Ignacia Palma on: 08/22/2012 04:52 PM   Modules accepted: Orders, Medications

## 2012-08-26 NOTE — Telephone Encounter (Signed)
Patient scheduled for retinal camera for 08/27/12

## 2012-08-27 ENCOUNTER — Ambulatory Visit: Payer: Self-pay | Admitting: Dietician

## 2012-09-12 ENCOUNTER — Other Ambulatory Visit: Payer: Self-pay

## 2012-10-11 ENCOUNTER — Encounter: Payer: Self-pay | Admitting: Internal Medicine

## 2012-10-11 ENCOUNTER — Ambulatory Visit (INDEPENDENT_AMBULATORY_CARE_PROVIDER_SITE_OTHER): Payer: Medicaid Other | Admitting: Internal Medicine

## 2012-10-11 VITALS — BP 113/74 | HR 77 | Temp 97.2°F | Wt 158.6 lb

## 2012-10-11 DIAGNOSIS — M169 Osteoarthritis of hip, unspecified: Secondary | ICD-10-CM

## 2012-10-11 DIAGNOSIS — M1612 Unilateral primary osteoarthritis, left hip: Secondary | ICD-10-CM

## 2012-10-11 DIAGNOSIS — M1610 Unilateral primary osteoarthritis, unspecified hip: Secondary | ICD-10-CM

## 2012-10-11 DIAGNOSIS — E1129 Type 2 diabetes mellitus with other diabetic kidney complication: Secondary | ICD-10-CM

## 2012-10-11 DIAGNOSIS — E785 Hyperlipidemia, unspecified: Secondary | ICD-10-CM

## 2012-10-11 DIAGNOSIS — E1165 Type 2 diabetes mellitus with hyperglycemia: Secondary | ICD-10-CM

## 2012-10-11 DIAGNOSIS — R109 Unspecified abdominal pain: Secondary | ICD-10-CM

## 2012-10-11 LAB — GLUCOSE, CAPILLARY: Glucose-Capillary: 357 mg/dL — ABNORMAL HIGH (ref 70–99)

## 2012-10-11 LAB — POCT URINALYSIS DIPSTICK
Bilirubin, UA: NEGATIVE
Glucose, UA: >1000
Ketones, UA: NEGATIVE
Leukocytes, UA: NEGATIVE
Nitrite, UA: NEGATIVE

## 2012-10-11 MED ORDER — INSULIN GLARGINE 100 UNITS/ML SOLOSTAR PEN: 40.0000 [IU] | PEN_INJECTOR | Freq: Every day | SUBCUTANEOUS | Status: DC

## 2012-10-11 MED ORDER — HYDROCODONE-ACETAMINOPHEN 5-325 MG PO TABS: 1.0000 | ORAL_TABLET | Freq: Three times a day (TID) | ORAL | Status: DC | PRN

## 2012-10-11 NOTE — Assessment & Plan Note (Addendum)
Lab Results  Component Value Date   HGBA1C >14.0 10/11/2012   HGBA1C >14.0 05/15/2012   HGBA1C >14.0 02/07/2012     Assessment: Diabetes control: poor control (HgbA1C >9%) Progress toward A1C goal:  deteriorated Comments: Patient continues to be noncompliant to her insulin regimen partly because of cost, but also due to limited understanding of her disease. She had been changed to NPH during the previous visit. She requests to be restarted back on her Lantus since her Medicaid has been recently approved, and she can now afford it. Repeat A1c is above 14%.  Plan: Medications:  Lantus 40 units at bedtime. Patient is likely will require higher doses of insulin. Home glucose monitoring: Frequency: 3 times a day Timing: before breakfast;before lunch;before dinner Instruction/counseling given: reminded to bring blood glucose meter & log to each visit, discussed the need for weight loss and discussed diet Educational resources provided:   Self management tools provided:   Other plans: Instructed the patient to come back within one week with her glucose meter, so that we can adjust her Lantus dose. I have also recommended that she checks her blood sugar 4 times a day during this time.

## 2012-10-11 NOTE — Assessment & Plan Note (Signed)
Differential diagnosis in this setting include a urinary tract infection with left pyonephritis, renal stone, or this pain being from the left hip arthritis. Without fevers or chills, and other constitutional symptoms infection is unlikely. Patient has a history of bilateral tubal ligation. Review of her chart reveals a normal abdomen ultrasound scan on 11/26/2011. CT scan 12/29/2010 previous only revealed dominant follicle in the left ovary. Both his images were performed with a complaint of left flank pain which makes this a recurrent problem. Plan. Ordered a recheck abdomen ultrasound scan. If this is unrevealing and there remains a concern of a renal stone, will proceed with a repeat abdominal CT scan in November though this is unlikely to be revealing given negative imaging in 2012. A urinalysis to rule out hematuria, or urinary tract infection. Prescribed Vicodin for short-term # 30 pills.

## 2012-10-11 NOTE — Progress Notes (Signed)
[name], female   DOB: 12-Sep-1970, 42 y.o.   MRN: [id number]          HPI: [name] is a 42 y.o. with past medical history listed below presents to the clinic with complaints of left flank pain for the last 2 weeks.  The pain is dull, 8/10, constant, increased by walking, without nausea or vomiting. The pain does not seem to radiate. It interrupts her sleep. She has been trying several remedies including tramadol, ibuprofen, and codeine without relief. She does not have history of trauma to that area. Patient has a history of chronic left hip problems, associated with pain. She reports that she has bursitis ( no formal documentation) in that area which has previously been treated with steroid injections. She feels that her current pain is different. She does not have urinary symptoms like, dysuria, hematuria. She reports good bowel movements. She has no history of renal stones.  Please see the A&P for the status of the pt's chronic medical problems.  Past Medical History  Diagnosis Date  . Peripheral neuropathy   . Fibromyalgia   . Episodic recurrent vertigo   . Vitamin D deficiency     history of  . Hyperlipidemia   . Diabetes mellitus   . Chronic rhinitis   . Depression   . Asthma   . Tinnitus     history of, Evaluated by ENT> [place]  . Rheumatoid arthritis(714.0)     seen by Rheumatology in [facility], Rheumatoid Factor pos, Anti CCP pos in 2005 -? early presentation of RF  . Obesity   . History of hip fracture     hx childhood hip fracture   Current Outpatient Prescriptions  Medication Sig Dispense Refill  . albuterol (PROVENTIL,VENTOLIN) 90 MCG/ACT inhaler 2 puffs 4 (four) times daily as needed. For shortness of breath.       Marland Kitchen amitriptyline (ELAVIL) 25 MG tablet Take 1 tablet (25 mg total) by mouth at bedtime.  30 tablet  1  . diclofenac sodium (VOLTAREN) 1 % GEL Apply 1 application topically 3 (three) times daily.      Marland Kitchen glucose blood  (ACCU-CHEK AVIVA PLUS) test strip 1 each by Other route 3 (three) times daily. Dx code: 250.62  100 each  6  . Lancets (ACCU-CHEK MULTICLIX) lancets Use to test blood sugar 3 times a day       . HYDROcodone-acetaminophen (NORCO) 5-325 MG per tablet Take 1-2 tablets by mouth every 8 (eight) hours as needed for pain.  30 tablet  0  . hydrocortisone 2.5 % lotion Apply to affected area 2 times daily  60 mL  1  . insulin glargine (LANTUS) 100 units/mL SOLN Inject 40 Units into the skin daily at 10 pm.  15 mL  1  . Insulin Pen Needle (PRODIGY MINI PEN NEEDLES) 31G X 5 MM MISC Use to inject lantus insulin once daily in the morning  100 each  6  . meloxicam (MOBIC) 7.5 MG tablet Take 7.5 mg by mouth daily.       No current facility-administered medications for this visit.   Family History  Problem Relation Age of Onset  . Hypertension Mother   . Diabetes Maternal Grandmother   . Arthritis Maternal Grandmother   . Kidney disease Maternal Grandmother    History   Social History  . Marital Status: Legally Separated    Spouse Name: N/A    Number of Children: N/A  . Years of  Education: N/A   Social History Main Topics  . Smoking status: Never Smoker   . Smokeless tobacco: None  . Alcohol Use: Yes     Comment: ccasionally  . Drug Use: No  . Sexually Active: None   Other Topics Concern  . None   Social History Narrative   [phone]    Review of Systems: Constitutional: Denies fever, chills, diaphoresis. She reports ongoing fatigue, and occasional nausea.  Respiratory: Denies SOB, DOE, cough, chest tightness, and wheezing.  Cardiovascular: No chest pain, palpitations and leg swelling.  Psych: Patient reports reduced sleep due to her pain.  Objective:  Physical Exam: Filed Vitals:   10/11/12 1046  BP: 113/74  Pulse: 77  Temp: 97.2 F (36.2 C)  TempSrc: Oral  Weight: 158 lb 9.6 oz (71.94 kg)  SpO2: 100%   General: Well nourished. No acute distress.  Lungs: CTA  bilaterally. Heart: RRR; no extra sounds or murmurs  Abdomen: Non-distended, normal BS, soft, no hepatosplenomegaly. On deep palpation, there is left lumbar and costovertebral angle tenderness. No bleeding, or rebound tenderness.  Extremities: No pedal edema. No joint swelling or tenderness. Internal and external rotation of the left hip elicits mild pain. Examination of the right hip is normal. She has good strength in all her extremities. Back: No areas of tenderness. Neurologic: Alert and oriented x3. No obvious neurologic deficits.  Assessment & Plan:  I have discussed my assessment and plan  with Dr. Meredith Pel as detailed under problem based charting.

## 2012-10-11 NOTE — Patient Instructions (Addendum)
General Instructions: Please take Vicodin for your pain Please stop taking NPH insulin and start using Lantus 40 units at bedtime We will do Ultrasound to try find the cause of your pain  I will also order a urine check and cholesterol check  I will call with results Please follow up in two weeks   Treatment Goals:  Goals (1 Years of Data) as of 10/11/12         As of Today 05/15/12 02/07/12 09/06/11 03/29/11     Lifestyle    . Prevent Falls           Result Component    . HEMOGLOBIN A1C < 7.0  >14.0 >14.0 >14.0 13.6     . LDL CALC < 100      98      Progress Toward Treatment Goals:  Treatment Goal 10/11/2012  Hemoglobin A1C deteriorated    Self Care Goals & Plans:  Self Care Goal 10/11/2012  Manage my medications bring my medications to every visit; refill my medications on time  Monitor my health keep track of my blood glucose; bring my glucose meter and log to each visit; keep track of my blood pressure  Eat healthy foods eat more vegetables; drink diet soda or water instead of juice or soda  Be physically active -    Home Blood Glucose Monitoring 10/11/2012  Check my blood sugar 3 times a day  When to check my blood sugar before breakfast; before lunch; before dinner     Care Management & Community Referrals:  Referral 05/15/2012  Referrals made for care management support other (see comment)

## 2012-10-12 LAB — URINALYSIS, MICROSCOPIC ONLY
Casts: NONE SEEN
Crystals: NONE SEEN

## 2012-10-12 LAB — COMPLETE METABOLIC PANEL WITH GFR
ALT: 23 U/L (ref 0–35)
AST: 20 U/L (ref 0–37)
CO2: 28 mEq/L (ref 19–32)
Chloride: 95 mEq/L — ABNORMAL LOW (ref 96–112)
Creat: 0.76 mg/dL (ref 0.50–1.10)
GFR, Est African American: >89 mL/min
Sodium: 132 mEq/L — ABNORMAL LOW (ref 135–145)
Total Bilirubin: 0.4 mg/dL (ref 0.3–1.2)
Total Protein: 7.2 g/dL (ref 6.0–8.3)

## 2012-10-12 LAB — LIPID PANEL
Cholesterol: 192 mg/dL (ref 0–200)
HDL: 46 mg/dL (ref >39)
Total CHOL/HDL Ratio: 4.2 Ratio
Triglycerides: 183 mg/dL — ABNORMAL HIGH (ref <150)

## 2012-10-12 LAB — URINALYSIS, ROUTINE W REFLEX MICROSCOPIC
Bilirubin Urine: NEGATIVE
Glucose, UA: >1000 mg/dL — AB
Ketones, ur: NEGATIVE mg/dL
Specific Gravity, Urine: >1.03 — ABNORMAL HIGH (ref 1.005–1.030)

## 2012-10-12 LAB — MICROALBUMIN / CREATININE URINE RATIO: Microalb, Ur: 7.35 mg/dL — ABNORMAL HIGH (ref 0.00–1.89)

## 2012-10-18 ENCOUNTER — Ambulatory Visit (HOSPITAL_COMMUNITY)
Admission: RE | Admit: 2012-10-18 | Discharge: 2012-10-18 | Disposition: A | Discharge status: Home / Self Care | Payer: Medicaid Other | Source: Ambulatory Visit | Attending: Internal Medicine | Admitting: Internal Medicine

## 2012-10-18 DIAGNOSIS — E119 Type 2 diabetes mellitus without complications: Secondary | ICD-10-CM | POA: Insufficient documentation

## 2012-10-18 DIAGNOSIS — K7689 Other specified diseases of liver: Secondary | ICD-10-CM | POA: Insufficient documentation

## 2012-10-18 DIAGNOSIS — E785 Hyperlipidemia, unspecified: Secondary | ICD-10-CM | POA: Insufficient documentation

## 2012-10-18 DIAGNOSIS — R109 Unspecified abdominal pain: Secondary | ICD-10-CM | POA: Insufficient documentation

## 2012-10-18 DIAGNOSIS — IMO0001 Reserved for inherently not codable concepts without codable children: Secondary | ICD-10-CM | POA: Insufficient documentation

## 2012-11-08 ENCOUNTER — Ambulatory Visit: Payer: Medicaid Other | Admitting: Dietician

## 2012-12-22 IMAGING — CR DG CHEST 2V
2 series · 2 of 2 positions shown · non-contrast
Comparison: 05/01/2010.

CLINICAL DATA: Right-sided chest pain and weakness for 3 days.

CHEST - 2 VIEW

[w chest pa]
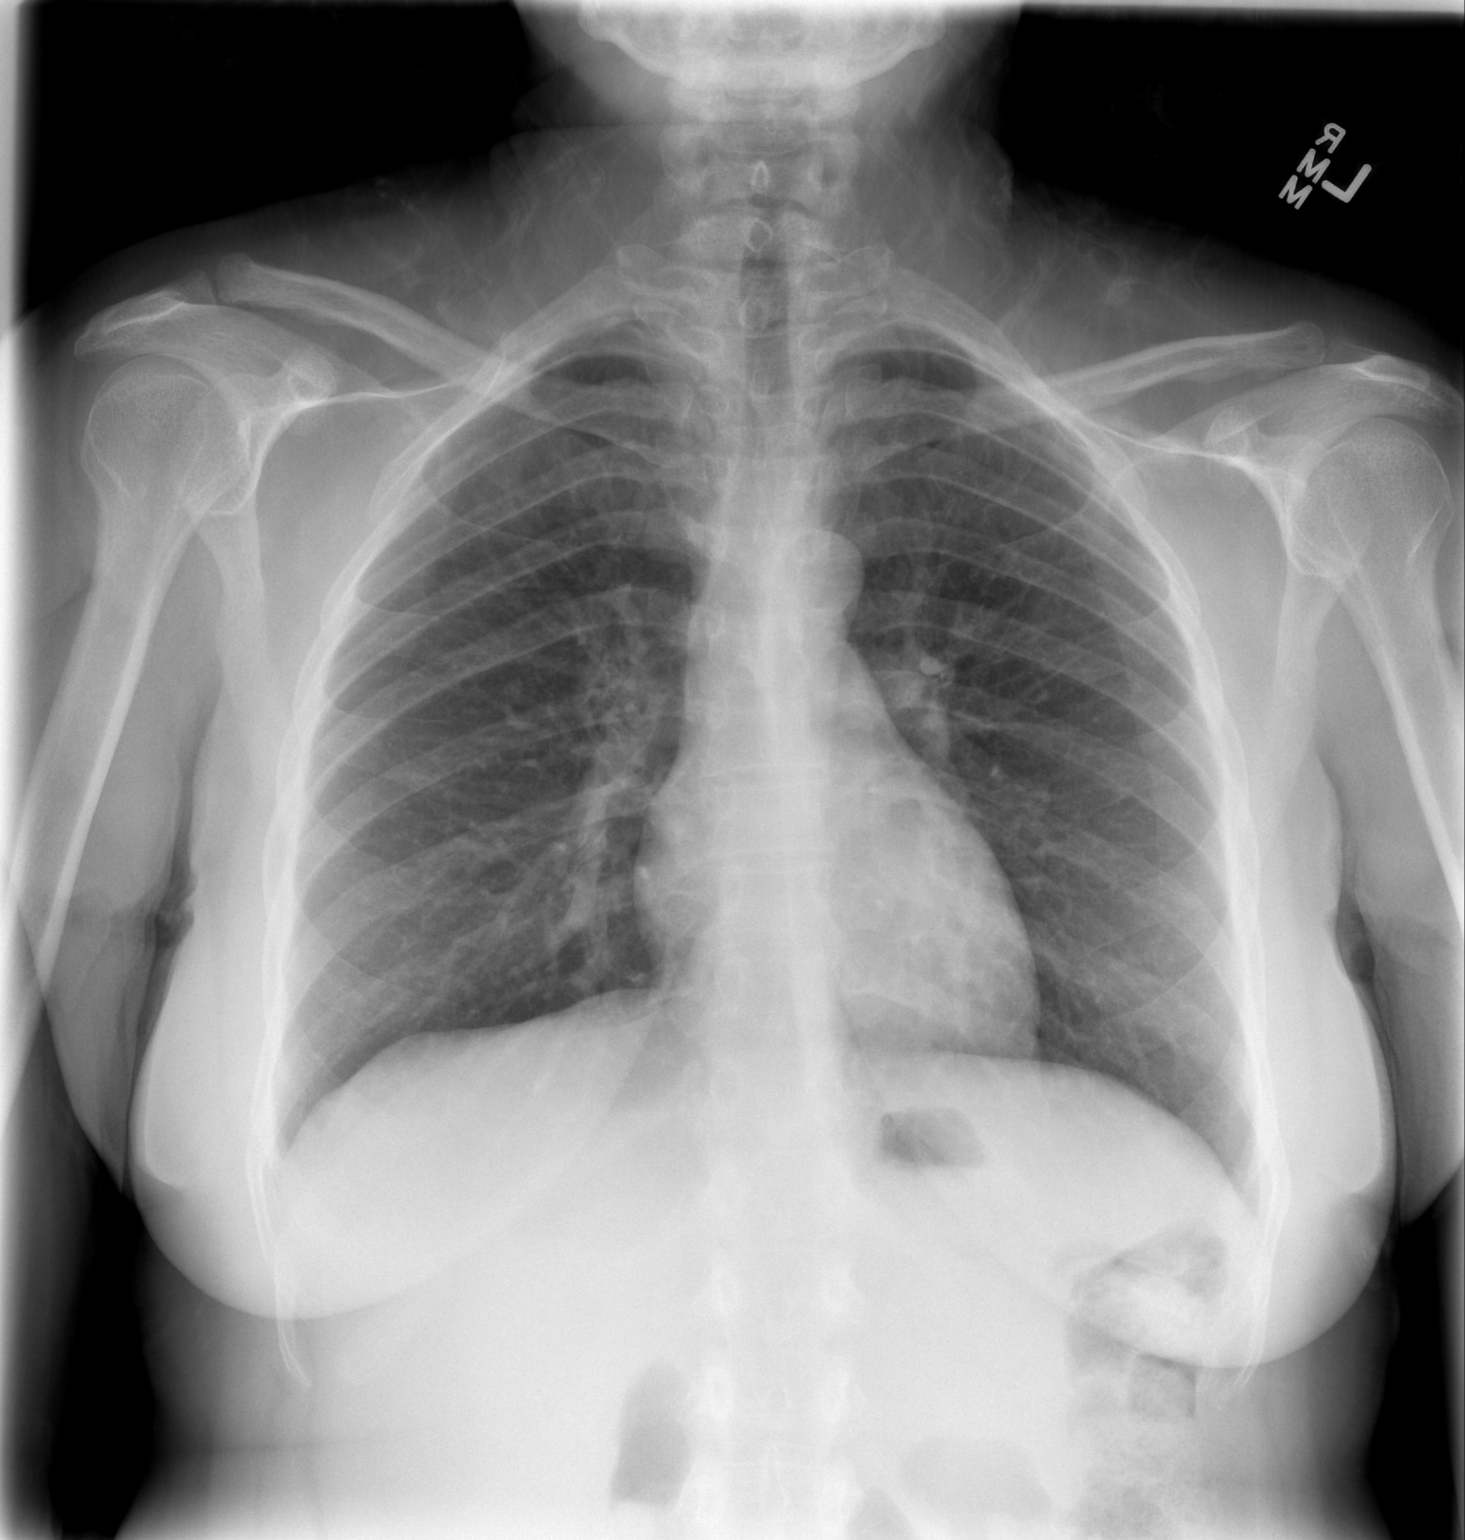

[w chest lat]
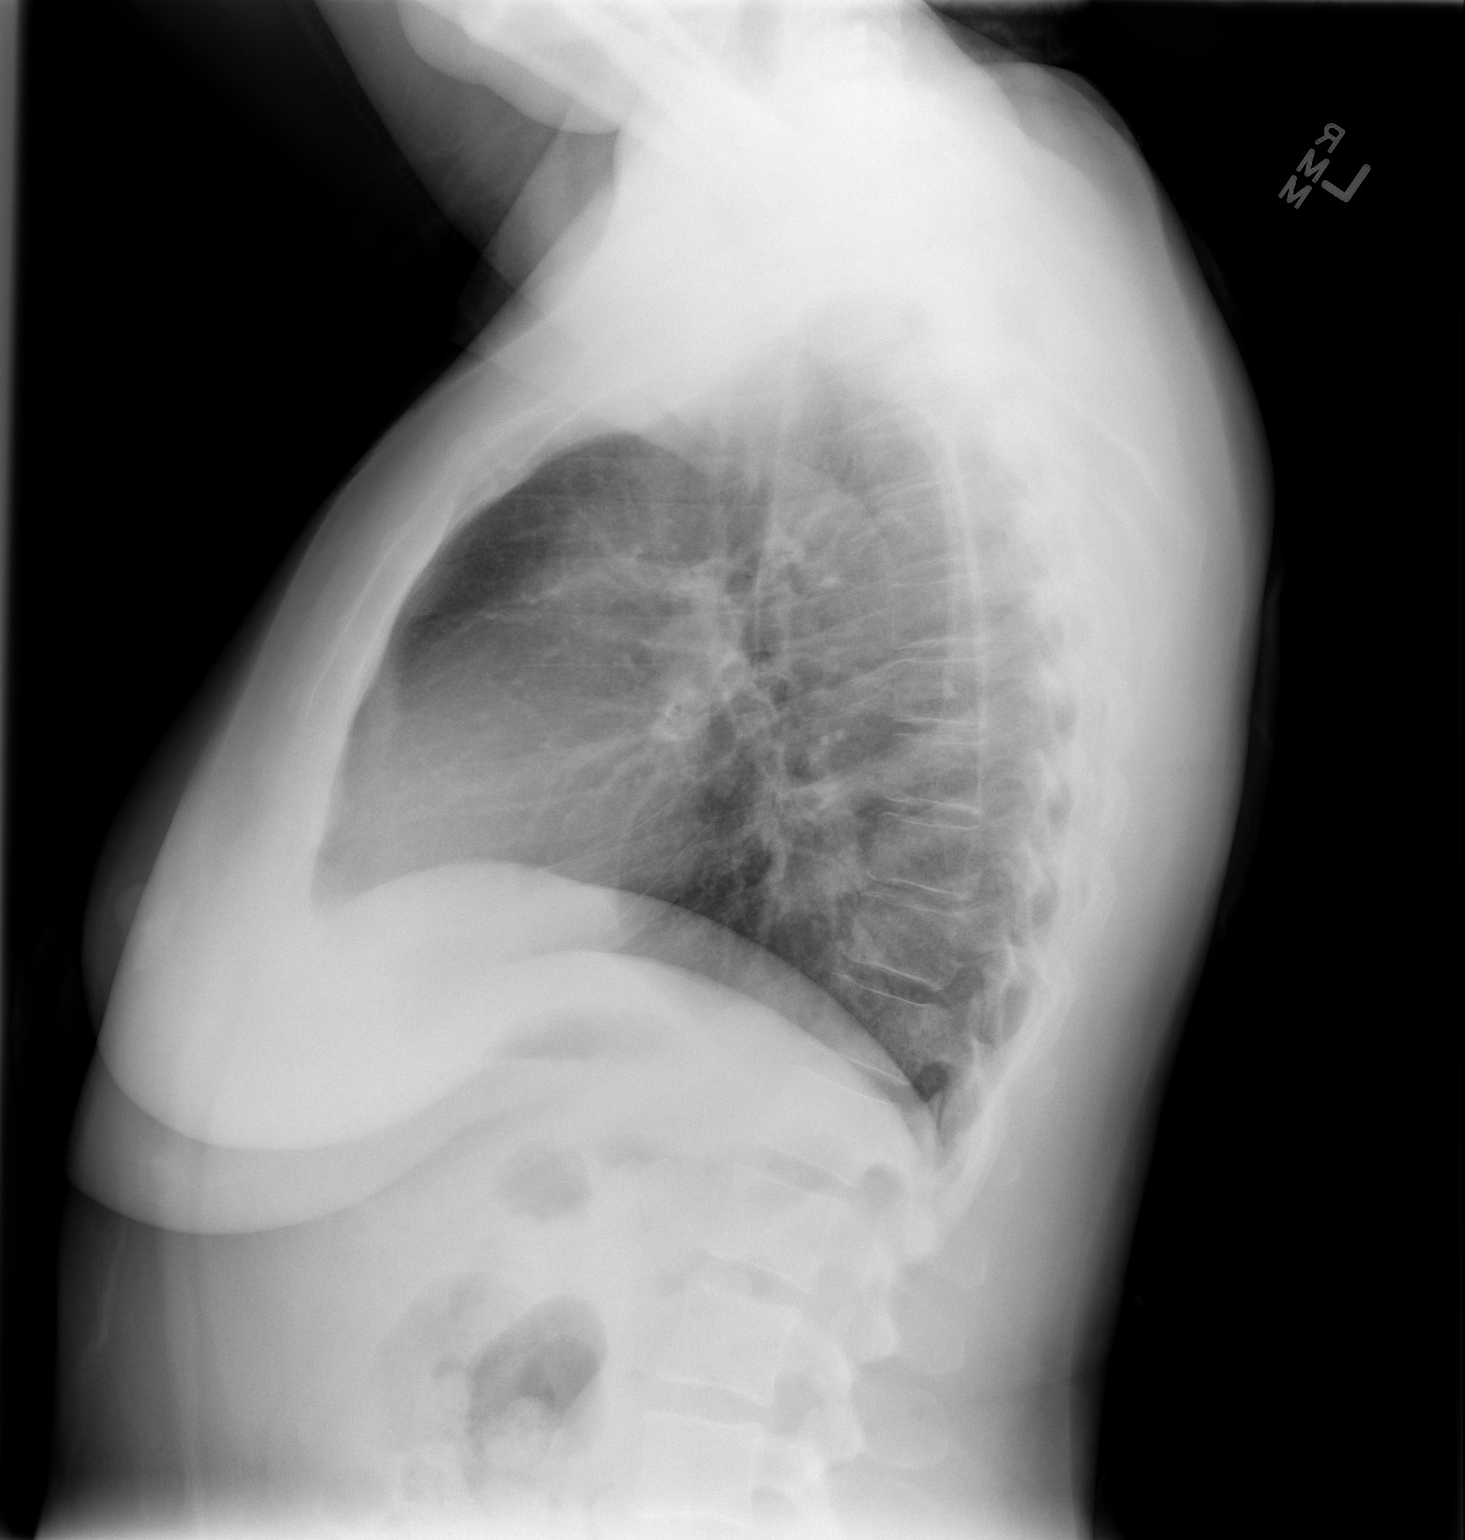

[2 of 2 positions shown; findings below may reference images not displayed]

FINDINGS: Cardiopericardial silhouette within normal limits.
Mediastinal contours normal. Trachea midline.  No airspace disease
or effusion.
IMPRESSION: Negative two-view chest.

## 2012-12-23 ENCOUNTER — Ambulatory Visit: Payer: Medicaid Other | Admitting: Internal Medicine

## 2013-01-02 ENCOUNTER — Encounter (INDEPENDENT_AMBULATORY_CARE_PROVIDER_SITE_OTHER): Payer: Self-pay

## 2013-01-02 ENCOUNTER — Encounter: Payer: Self-pay | Admitting: Internal Medicine

## 2013-01-02 ENCOUNTER — Ambulatory Visit (INDEPENDENT_AMBULATORY_CARE_PROVIDER_SITE_OTHER): Payer: Medicaid Other | Admitting: Internal Medicine

## 2013-01-02 VITALS — BP 113/78 | HR 83 | Temp 97.1°F | Wt 155.0 lb

## 2013-01-02 DIAGNOSIS — Z23 Encounter for immunization: Secondary | ICD-10-CM

## 2013-01-02 DIAGNOSIS — E1129 Type 2 diabetes mellitus with other diabetic kidney complication: Secondary | ICD-10-CM

## 2013-01-02 DIAGNOSIS — H9190 Unspecified hearing loss, unspecified ear: Secondary | ICD-10-CM | POA: Insufficient documentation

## 2013-01-02 DIAGNOSIS — R112 Nausea with vomiting, unspecified: Secondary | ICD-10-CM

## 2013-01-02 DIAGNOSIS — Z0000 Encounter for general adult medical examination without abnormal findings: Secondary | ICD-10-CM

## 2013-01-02 DIAGNOSIS — J45909 Unspecified asthma, uncomplicated: Secondary | ICD-10-CM

## 2013-01-02 DIAGNOSIS — H9191 Unspecified hearing loss, right ear: Secondary | ICD-10-CM

## 2013-01-02 DIAGNOSIS — M25569 Pain in unspecified knee: Secondary | ICD-10-CM

## 2013-01-02 DIAGNOSIS — R42 Dizziness and giddiness: Secondary | ICD-10-CM

## 2013-01-02 LAB — HM DIABETES EYE EXAM

## 2013-01-02 LAB — BASIC METABOLIC PANEL WITH GFR
Calcium: 9.6 mg/dL (ref 8.4–10.5)
GFR, Est African American: >89 mL/min
Sodium: 132 mEq/L — ABNORMAL LOW (ref 135–145)

## 2013-01-02 MED ORDER — ALBUTEROL SULFATE HFA 108 (90 BASE) MCG/ACT IN AERS: 2.0000 | INHALATION_SPRAY | Freq: Four times a day (QID) | RESPIRATORY_TRACT | Status: DC | PRN

## 2013-01-02 MED ORDER — INSULIN GLARGINE 100 UNITS/ML SOLOSTAR PEN: 40.0000 [IU] | PEN_INJECTOR | Freq: Every day | SUBCUTANEOUS | Status: DC

## 2013-01-02 MED ORDER — MELOXICAM 7.5 MG PO TABS: 7.5000 mg | ORAL_TABLET | Freq: Every day | ORAL | Status: DC | PRN

## 2013-01-02 NOTE — Assessment & Plan Note (Signed)
Referral to [facility]. Refilled her Mobic.

## 2013-01-02 NOTE — Progress Notes (Signed)
[name], female   DOB: 02-28-1971, 42 y.o.   MRN: [id number]   Subjective:   HPI: [name] is a 42 y.o. woman with past medical history of chronic knee pains, diabetes, hypertension, hyperlipidemia, depression, presents to the clinic due to reduced hearing and pain in the right ear for 2 weeks.  She reports that her right ear has been bothering her. Over the last 2 weeks, with reduced hearing. No drainage, fevers, chills, or increased fatigue. The pain is dull, 5/10 and constant. It does not radiate. She has not tried any remedies from home. No relieving or aggravating factors. No history of sore throat, cough, shortness of breath, postnasal drip, or symptoms of rhinitis. She reports associated symptoms of dizziness and tinnitus, and recent falls. Patient reports that has experienced these symptoms before with improvement after several weeks.  She denies vomiting, nausea, or or feelings sick to her stomach.  For chronic knee pains (rt >lt), the patient requests referral back to sports medicine.   DM2, uncontrolled -  Lab Results  Component Value Date   HGBA1C >14.0 01/02/2013   Patient checking blood sugars 0 times a weeks (meter has been broken for several weeks). She's been off her insulin for one week. She is supposed to take insulin 40 units each bedtime. She requests for refills and he promises to take her insulin.  0 hypoglycemic episodes since last visit. Denies assisted hypoglycemia or recently hospitalizations for either hyper or hypoglycemia. denies polyuria, polydipsia, nausea, vomiting, diarrhea.  does request refills today.  In regards to diabetic complications:  Microvascular complications: Confirms: autonomic neuropathy and peripheral neuropathy ; Denies peripheral neuropathy.  Macrovascular complications: Confirms: none ; Denies peripheral neuropathy.   Important diabetic medications: Is patient on aspirin? No Is patient on a statin? No Is patient  on an ACE-I/ ARB? No    Past Medical History  Diagnosis Date  . Peripheral neuropathy   . Fibromyalgia   . Episodic recurrent vertigo   . Vitamin D deficiency     history of  . Hyperlipidemia   . Diabetes mellitus   . Chronic rhinitis   . Depression   . Asthma   . Tinnitus     history of, Evaluated by ENT> [place]  . Rheumatoid arthritis(714.0)     seen by Rheumatology in [facility], Rheumatoid Factor pos, Anti CCP pos in 2005 -? early presentation of RF  . Obesity   . History of hip fracture     hx childhood hip fracture   Current Outpatient Prescriptions  Medication Sig Dispense Refill  . diclofenac sodium (VOLTAREN) 1 % GEL Apply 1 application topically 3 (three) times daily.      . hydrocortisone 2.5 % lotion Apply to affected area 2 times daily  60 mL  1  . albuterol (PROVENTIL HFA;VENTOLIN HFA) 108 (90 BASE) MCG/ACT inhaler Inhale 2 puffs into the lungs every 6 (six) hours as needed for wheezing.  1 Inhaler  2  . amitriptyline (ELAVIL) 25 MG tablet Take 1 tablet (25 mg total) by mouth at bedtime.  30 tablet  1  . glucose blood (ACCU-CHEK AVIVA PLUS) test strip 1 each by Other route 3 (three) times daily. Dx code: 250.62  100 each  6  . insulin glargine (LANTUS) 100 units/mL SOLN Inject 40 Units into the skin daily at 10 pm.  15 mL  11  . Insulin Pen Needle (PRODIGY MINI PEN NEEDLES) 31G X 5 MM MISC Use to inject  lantus insulin once daily in the morning  100 each  6  . Lancets (ACCU-CHEK MULTICLIX) lancets Use to test blood sugar 3 times a day       . meloxicam (MOBIC) 7.5 MG tablet Take 1-2 tablets (7.5-15 mg total) by mouth daily as needed for pain.  60 tablet  1   No current facility-administered medications for this visit.   Family History  Problem Relation Age of Onset  . Hypertension Mother   . Diabetes Maternal Grandmother   . Arthritis Maternal Grandmother   . Kidney disease Maternal Grandmother    History   Social History  . Marital Status: Legally  Separated    Spouse Name: N/A    Number of Children: N/A  . Years of Education: 11   Occupational History  .     Social History Main Topics  . Smoking status: Never Smoker   . Smokeless tobacco: None  . Alcohol Use: Yes     Comment: ccasionally  . Drug Use: No  . Sexual Activity: None   Other Topics Concern  . None   Social History Narrative   [phone]   Review of Systems: Constitutional: Denies fever, chills, diaphoresis, appetite change and fatigue.  Respiratory: Denies SOB, DOE, cough, chest tightness, and wheezing. Denies chest pain. Cardiovascular: No chest pain, palpitations and leg swelling.  Gastrointestinal: No abdominal pain, nausea, vomiting, bloody stools Genitourinary: No dysuria, frequency, hematuria, or flank pain.  Psych: No depression symptoms. No SI or SA.   Objective:  Physical Exam: Filed Vitals:   01/02/13 1049 01/02/13 1116 01/02/13 1118 01/02/13 1119  BP: 126/87 110/72 113/80 113/78  Pulse: 84 78 81 83  Temp: 97.1 F (36.2 C)     TempSrc: Oral     Weight: 155 lb (70.308 kg)     SpO2: 94%      General: Well nourished. No acute distress.  HEENT: Normal oral mucosa. MMM. Oropharynx clear without erythema or exudates. Normal ear exam bilaterally. Right ear specifically, without drainage, external auditory canal without inflammation. Mild tenderness during advancement of otoscope. Lungs: CTA bilaterally. Clear bilaterally  Heart: RRR; no extra sounds or murmurs  Abdomen: Non-distended, normal BS, soft, nontender; no hepatosplenomegaly  Extremities: bilateral knee tenderness without evidence of infection or inflammation Rt >>LT. No LE edema.  Upper extremity exam normal.  Neurologic Exam:    Mental Status:  Alert, oriented, thought content appropriate. Speech fluent without evidence of aphasia.   Cranial Nerves:    Rinne and Weber test Lateralization to the right ear.  III/IV/VI:  Extraocular movements intact. Pupils reactive bilaterally.     V/VII:  Smile symmetric. facial light touch sensation normal bilaterally.   VIII:  Grossly intact.      XI:  Bilateral shoulder shrug normal.   XII:  Midline tongue extension normal.   Motor:  5/5 bilaterally with normal tone and bulk   Sensory:  Light touch intact throughout, bilaterally   DTRs:  2+ and symmetric throughout   Plantars:  Downgoing bilaterally   Cerebellar:  Pronator drip  Hix Hallpike  Maneuver  Normal finger-to-nose, normal rapid alternating movements and normal heel-to-shin test. normal gait.  normal  No horizontal or vertical nystagmus. No report of increased symptoms.  Romberg sign -negative     Assessment & Plan:  I have discussed my assessment and plan  with  my attending in the clinic, Dr. Dalphine Handing  as detailed under problem based charting.
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
## 2013-01-02 NOTE — Patient Instructions (Addendum)
General Instructions: I will send you over to sports medicine  Please take your insulin more regular and call for refills early  I will refer your to an ear, nose and throat specialist  Please take Mobic 7.5 to 15 mg daily for your pain  I will send you to Lupita Leash today today. Please follow up her in 2 weeks to see make changes to your insulin.   Treatment Goals:  Goals (1 Years of Data) as of 01/02/13         As of Today 10/11/12 05/15/12 02/07/12 09/06/11     Lifestyle    . Prevent Falls           Result Component    . HEMOGLOBIN A1C < 7.0  >14.0 >14.0 >14.0 >14.0 13.6    . LDL CALC < 100   109         Progress Toward Treatment Goals:  Treatment Goal 01/02/2013  Hemoglobin A1C unchanged  Prevent falls deteriorated    Self Care Goals & Plans:  Self Care Goal 01/02/2013  Manage my medications take my medicines as prescribed; bring my medications to every visit; refill my medications on time  Monitor my health keep track of my blood glucose; bring my glucose meter and log to each visit  Eat healthy foods -  Be physically active -    Home Blood Glucose Monitoring 01/02/2013  Check my blood sugar 3 times a day  When to check my blood sugar before breakfast; before lunch; before dinner     Care Management & Community Referrals:  Referral 05/15/2012  Referrals made for care management support other (see comment)

## 2013-01-02 NOTE — Assessment & Plan Note (Signed)
Assessment: Unilateral right hearing impairment, with some evidence of conduction disorder. Suspect inner ear conduction abnormalities like Labyrinthitis.  Benign paroxysmal positional vertigo is unlikely without nystagmus. No evidence of cerumen impaction otitis externa from physical exam.  Plan: 1. Labs/imaging: none 2. Therapy: Mobic for pain control. Referral to ENT for urgent evaluation. 3. Follow up: in 2 weeks.

## 2013-01-08 ENCOUNTER — Other Ambulatory Visit: Payer: Self-pay | Admitting: Internal Medicine

## 2013-01-08 DIAGNOSIS — Z1231 Encounter for screening mammogram for malignant neoplasm of breast: Secondary | ICD-10-CM

## 2013-01-08 NOTE — Progress Notes (Signed)
Case discussed with Dr.Kazibwe at the time of the visit.  We reviewed the resident's history and exam and pertinent patient test results.  I agree with the assessment, diagnosis, and plan of care documented in the resident's note.    

## 2013-01-15 ENCOUNTER — Ambulatory Visit: Payer: Medicaid Other | Admitting: Sports Medicine

## 2013-01-15 NOTE — Addendum Note (Signed)
Addended by: Remus Blake on: 01/15/2013 09:11 AM   Modules accepted: Orders

## 2013-01-16 ENCOUNTER — Encounter: Payer: Self-pay | Admitting: Internal Medicine

## 2013-01-16 ENCOUNTER — Ambulatory Visit: Payer: Medicaid Other | Admitting: Internal Medicine

## 2013-01-21 ENCOUNTER — Encounter: Payer: Medicaid Other | Admitting: Internal Medicine

## 2013-01-21 ENCOUNTER — Ambulatory Visit: Payer: Medicaid Other | Admitting: Internal Medicine

## 2013-01-22 ENCOUNTER — Ambulatory Visit: Payer: Medicaid Other | Admitting: Internal Medicine

## 2013-01-23 ENCOUNTER — Encounter: Payer: Self-pay | Admitting: Internal Medicine

## 2013-01-23 ENCOUNTER — Ambulatory Visit (INDEPENDENT_AMBULATORY_CARE_PROVIDER_SITE_OTHER): Payer: Medicaid Other | Admitting: Internal Medicine

## 2013-01-23 VITALS — BP 121/80 | HR 92 | Temp 97.0°F | Ht 64.0 in | Wt 158.2 lb

## 2013-01-23 DIAGNOSIS — D229 Melanocytic nevi, unspecified: Secondary | ICD-10-CM

## 2013-01-23 DIAGNOSIS — D239 Other benign neoplasm of skin, unspecified: Secondary | ICD-10-CM

## 2013-01-23 NOTE — Patient Instructions (Signed)
We will schedule you an appointment with Dermatology at [facility] in [place]. Follow-up with your Primary Care physician after evaluation by the dermatologist.

## 2013-01-23 NOTE — Progress Notes (Signed)
  Subjective:    [name], female    DOB: 09/13/1970, 42 y.o.   MRN: [id number]  HPI  Presents with complaints of painful mole on her left side that was evaluated at a free skin check clinic recently. After evaluation by the dermatologist at the clinic she was recommended to followup with a dermatologist for further evaluation of this mole which has become raised, itchy, and somewhat painful.  Review of Systems  Constitutional: Negative.   HENT: Negative.   Eyes: Negative.   Respiratory: Negative.   Cardiovascular: Negative.   Gastrointestinal: Negative.   Endocrine: Negative.   Genitourinary: Negative.   Musculoskeletal: Negative.   Skin:       Itchy, painful mole on left side which has become raised over the past month  Allergic/Immunologic: Negative.   Neurological: Negative.   Hematological: Negative.   Psychiatric/Behavioral: Negative.        Objective:   Physical Exam  Constitutional: She is oriented to person, place, and time. She appears well-developed and well-nourished. No distress.  HENT:  Head: Normocephalic and atraumatic.  Eyes: Conjunctivae and EOM are normal. Pupils are equal, round, and reactive to light.  Neck: Neck supple.  Cardiovascular: Normal rate and regular rhythm.   Pulmonary/Chest: Effort normal and breath sounds normal.  Abdominal: Soft. Bowel sounds are normal.  Musculoskeletal: Normal range of motion.  Neurological: She is alert and oriented to person, place, and time.  Skin: Skin is warm and dry. Lesion noted.     Psychiatric: She has a normal mood and affect.          Assessment & Plan:  See separate problem list charting:   #1 atypical mole: Will refer to dermatology likely would have to be a [place] given patient's Medicaid insurance

## 2013-01-25 NOTE — Progress Notes (Signed)
Case discussed with Dr. Schooler soon after the resident saw the patient.  We reviewed the resident's history and exam and pertinent patient test results.  I agree with the assessment, diagnosis and plan of care documented in the resident's note. 

## 2013-01-27 ENCOUNTER — Other Ambulatory Visit: Payer: Self-pay | Admitting: Internal Medicine

## 2013-01-27 DIAGNOSIS — N644 Mastodynia: Secondary | ICD-10-CM

## 2013-02-20 NOTE — Addendum Note (Signed)
Addended by: Neomia Dear on: 02/20/2013 06:35 PM   Modules accepted: Orders

## 2013-03-15 ENCOUNTER — Encounter (HOSPITAL_COMMUNITY): Payer: Self-pay | Admitting: Emergency Medicine

## 2013-03-15 ENCOUNTER — Emergency Department (HOSPITAL_COMMUNITY)
Admission: EM | Admit: 2013-03-15 | Discharge: 2013-03-15 | Disposition: A | Discharge status: Home / Self Care | Payer: Medicaid Other | Attending: Emergency Medicine | Admitting: Emergency Medicine

## 2013-03-15 DIAGNOSIS — Z8781 Personal history of (healed) traumatic fracture: Secondary | ICD-10-CM | POA: Insufficient documentation

## 2013-03-15 DIAGNOSIS — Z8669 Personal history of other diseases of the nervous system and sense organs: Secondary | ICD-10-CM | POA: Insufficient documentation

## 2013-03-15 DIAGNOSIS — F329 Major depressive disorder, single episode, unspecified: Secondary | ICD-10-CM | POA: Insufficient documentation

## 2013-03-15 DIAGNOSIS — E669 Obesity, unspecified: Secondary | ICD-10-CM | POA: Insufficient documentation

## 2013-03-15 DIAGNOSIS — F3289 Other specified depressive episodes: Secondary | ICD-10-CM | POA: Insufficient documentation

## 2013-03-15 DIAGNOSIS — E119 Type 2 diabetes mellitus without complications: Secondary | ICD-10-CM | POA: Insufficient documentation

## 2013-03-15 DIAGNOSIS — Z794 Long term (current) use of insulin: Secondary | ICD-10-CM | POA: Insufficient documentation

## 2013-03-15 DIAGNOSIS — M069 Rheumatoid arthritis, unspecified: Secondary | ICD-10-CM | POA: Insufficient documentation

## 2013-03-15 DIAGNOSIS — IMO0001 Reserved for inherently not codable concepts without codable children: Secondary | ICD-10-CM | POA: Insufficient documentation

## 2013-03-15 DIAGNOSIS — N644 Mastodynia: Secondary | ICD-10-CM | POA: Insufficient documentation

## 2013-03-15 DIAGNOSIS — J45909 Unspecified asthma, uncomplicated: Secondary | ICD-10-CM | POA: Insufficient documentation

## 2013-03-15 MED ORDER — HYDROCODONE-ACETAMINOPHEN 5-325 MG PO TABS: 1.0000 | ORAL_TABLET | Freq: Four times a day (QID) | ORAL | Status: DC | PRN

## 2013-03-15 NOTE — Discharge Instructions (Signed)
Make an appointment with your OBGYN or the women[facility] hospital for routine mammogram. If you start to develop any rash along the breast please return to ED or see your PCP for reevaluation. Take medications as directed.    Breast Tenderness Breast tenderness is a common problem for women of all ages. Breast tenderness may cause mild discomfort to severe pain. It has a variety of causes. Your health care provider will find out the likely cause of your breast tenderness by examining your breasts, asking you about symptoms, and ordering some tests. Breast tenderness usually does not mean you have breast cancer. HOME CARE INSTRUCTIONS  Breast tenderness often can be handled at home. You can try:  Getting fitted for a new bra that provides more support, especially during exercise.  Wearing a more supportive bra or sports bra while sleeping when your breasts are very tender.  If you have a breast injury, apply ice to the area:  Put ice in a plastic bag.  Place a towel between your skin and the bag.  Leave the ice on for 20 minutes, 2 3 times a day.  If your breasts are too full of milk as a result of breastfeeding, try:  Expressing milk either by hand or with a breast pump.  Applying a warm compress to the breasts for relief.  Taking over-the-counter pain relievers, if approved by your health care provider.  Taking other medicines that your health care provider prescribes. These may include antibiotic medicines or birth control pills. Over the long term, your breast tenderness might be eased if you:  Cut down on caffeine.  Reduce the amount of fat in your diet. Keep a log of the days and times when your breasts are most tender. This will help you and your health care provider find the cause of the tenderness and how to relieve it. Also, learn how to do breast exams at home. This will help you notice if you have an unusual growth or lump that could cause tenderness. SEEK MEDICAL CARE IF:    Any part of your breast is hard, red, and hot to the touch. This could be a sign of infection.  Fluid is coming out of your nipples (and you are not breastfeeding). Especially watch for blood or pus.  You have a fever as well as breast tenderness.  You have a new or painful lump in your breast that remains after your menstrual period ends.  You have tried to take care of the pain at home, but it has not gone away.  Your breast pain is getting worse, or the pain is making it hard to do the things you usually do during your day. Document Released: 02/03/2008 Document Revised: 10/23/2012 Document Reviewed: 09/19/2012 [facility] Patient Information 2014 [place], [place].

## 2013-03-15 NOTE — ED Notes (Signed)
Pt reports for past several days shes had severe pain in R breast. Hurts to touch and wakes her from her sleep at night. States she can not visualize any changes to the breast

## 2013-03-15 NOTE — ED Provider Notes (Signed)
CSN: [id number]     Arrival date & time 03/15/13  55 History   First MD Initiated Contact with Patient 03/15/13 1351     Chief Complaint  Patient presents with  . Breast Pain   (Consider location/radiation/quality/duration/timing/severity/associated sxs/prior Treatment) HPI 43 yo female presents with unilateral RIGHT side breast pain x 4 days. Patient describes burning pain that has progressively worsened to 10/10 currently. Pain is constant without radiation. Has tried ibuprofen and hydrocodone without relief. Patient denies fever/chills, rash, swelling.  Past Medical History  Diagnosis Date  . Peripheral neuropathy   . Fibromyalgia   . Episodic recurrent vertigo   . Vitamin D deficiency     history of  . Hyperlipidemia   . Diabetes mellitus   . Chronic rhinitis   . Depression   . Asthma   . Tinnitus     history of, Evaluated by ENT> [place]  . Rheumatoid arthritis(714.0)     seen by Rheumatology in [facility], Rheumatoid Factor pos, Anti CCP pos in 2005 -? early presentation of RF  . Obesity   . History of hip fracture     hx childhood hip fracture   Past Surgical History  Procedure Laterality Date  . Cesarean section     Family History  Problem Relation Age of Onset  . Hypertension Mother   . Diabetes Maternal Grandmother   . Arthritis Maternal Grandmother   . Kidney disease Maternal Grandmother    History  Substance Use Topics  . Smoking status: Never Smoker   . Smokeless tobacco: Not on file  . Alcohol Use: Yes     Comment: ccasionally   OB History   Grav Para Term Preterm Abortions TAB SAB Ect Mult Living   6 6 6      1 6      Review of Systems  Constitutional: Negative for fever and chills.  HENT: Negative for congestion and sore throat.   Respiratory: Negative for shortness of breath.   Cardiovascular: Negative for chest pain.  Gastrointestinal: Negative for nausea, vomiting and abdominal pain.  Genitourinary: Negative for dysuria and hematuria.    Musculoskeletal: Negative for neck pain and neck stiffness.  Skin: Negative for color change, rash and wound.  Allergic/Immunologic: Negative for immunocompromised state.  Neurological: Negative for dizziness and headaches.  All other systems reviewed and are negative.    Allergies  Review of patient's allergies indicates no known allergies.  Home Medications   Current Outpatient Rx  Name  Route  Sig  Dispense  Refill  . insulin glargine (LANTUS) 100 units/mL SOLN   Subcutaneous   Inject 45 Units into the skin daily at 10 pm.         . albuterol (PROVENTIL HFA;VENTOLIN HFA) 108 (90 BASE) MCG/ACT inhaler   Inhalation   Inhale 2 puffs into the lungs every 6 (six) hours as needed for wheezing or shortness of breath.         . EXPIRED: amitriptyline (ELAVIL) 25 MG tablet   Oral   Take 1 tablet (25 mg total) by mouth at bedtime.   30 tablet   1   . HYDROcodone-acetaminophen (NORCO) 5-325 MG per tablet   Oral   Take 1-2 tablets by mouth every 6 (six) hours as needed for moderate pain or severe pain.   15 tablet   0    BP 121/81  Pulse 92  Temp(Src) 98.3 F (36.8 C) (Oral)  Resp 20  SpO2 100% Physical Exam  Nursing note and vitals  reviewed. Constitutional: She is oriented to person, place, and time. She appears well-developed and well-nourished. No distress.  HENT:  Head: Normocephalic and atraumatic.  Eyes: Conjunctivae and EOM are normal. No scleral icterus.  Neck: Normal range of motion. Neck supple.  Cardiovascular: Normal rate and regular rhythm.   Pulmonary/Chest: Effort normal and breath sounds normal. No respiratory distress. She has no wheezes. She has no rales. She exhibits no tenderness. Right breast exhibits no inverted nipple, no mass, no nipple discharge, no skin change and no tenderness.    No apparent abnormalities noted on exam. No signs of rash, no erythema, no lesions.   Musculoskeletal: Normal range of motion.  Lymphadenopathy:    She has no  cervical adenopathy.  Neurological: She is alert and oriented to person, place, and time.  Skin: Skin is warm and dry. She is not diaphoretic.  Psychiatric: She has a normal mood and affect. Her behavior is normal.    ED Course  Procedures (including critical care time) Labs Review Labs Reviewed - No data to display Imaging Review No results found.  EKG Interpretation   None       MDM   1. Breast pain in female    Patient afebrile with normal VS. No obvious rash appreciated. Exam completely benign. Recommend patient make an appointment with your OBGYN or the women's hospital for routine mammogram. If develop any rash along the affected breast please return to ED or see your PCP for reevaluation. Take medications as directed. Discussed exam findings with patient.  Patient agrees with plan. Discharged in good condition.       Sherrie George, PA-C 03/15/13 2108

## 2013-03-16 NOTE — ED Provider Notes (Signed)
Medical screening examination/treatment/procedure(s) were performed by non-physician practitioner and as supervising physician I was immediately available for consultation/collaboration.     Veryl Speak, MD 03/16/13 [phone]

## 2013-03-20 ENCOUNTER — Ambulatory Visit
Admission: RE | Admit: 2013-03-20 | Discharge: 2013-03-20 | Disposition: A | Discharge status: Home / Self Care | Payer: Medicaid Other | Source: Ambulatory Visit | Attending: Internal Medicine | Admitting: Internal Medicine

## 2013-03-20 DIAGNOSIS — N644 Mastodynia: Secondary | ICD-10-CM

## 2013-09-16 ENCOUNTER — Ambulatory Visit (INDEPENDENT_AMBULATORY_CARE_PROVIDER_SITE_OTHER): Payer: Medicaid Other | Admitting: Internal Medicine

## 2013-09-16 ENCOUNTER — Ambulatory Visit: Payer: Medicaid Other | Admitting: Internal Medicine

## 2013-09-16 ENCOUNTER — Encounter: Payer: Self-pay | Admitting: Internal Medicine

## 2013-09-16 VITALS — BP 109/74 | HR 79 | Temp 97.0°F | Ht 64.0 in | Wt 151.2 lb

## 2013-09-16 DIAGNOSIS — E785 Hyperlipidemia, unspecified: Secondary | ICD-10-CM

## 2013-09-16 DIAGNOSIS — E118 Type 2 diabetes mellitus with unspecified complications: Principal | ICD-10-CM

## 2013-09-16 DIAGNOSIS — L639 Alopecia areata, unspecified: Secondary | ICD-10-CM | POA: Insufficient documentation

## 2013-09-16 DIAGNOSIS — E1169 Type 2 diabetes mellitus with other specified complication: Secondary | ICD-10-CM

## 2013-09-16 DIAGNOSIS — E1165 Type 2 diabetes mellitus with hyperglycemia: Secondary | ICD-10-CM

## 2013-09-16 DIAGNOSIS — IMO0002 Reserved for concepts with insufficient information to code with codable children: Secondary | ICD-10-CM

## 2013-09-16 LAB — TSH: TSH: 1.378 u[IU]/mL (ref 0.350–4.500)

## 2013-09-16 LAB — POCT GLYCOSYLATED HEMOGLOBIN (HGB A1C): Hemoglobin A1C: >14

## 2013-09-16 LAB — GLUCOSE, CAPILLARY: Glucose-Capillary: 325 mg/dL — ABNORMAL HIGH (ref 70–99)

## 2013-09-16 MED ORDER — GABAPENTIN 100 MG PO CAPS: 100.0000 mg | ORAL_CAPSULE | Freq: Every day | ORAL | Status: DC

## 2013-09-16 NOTE — Progress Notes (Signed)
   Subjective:    [name], female    DOB: [birth year], 43 y.o.   MRN: [id number]  HPI Adriana Burke is a 43 year old female with chronic pain, DM2, HTN, hyperlipidemia, depression who presents to clinic today for a "spot on her head."   "Spot on her head":About two months ago, she bent down to pick something and hit her head on the right side against the corner of the bedpost. She noted there was a sore there and now it's just a bald spot. No bleeding, other places where she has lost hair. She has never had this happen to her before. She now wears a wig to cover the spot and was concerned it was sign of something systemic, like cancer.   DM2: Glucose today is 325; A1c today is 14. Notes pain is feet around toe and struggles with putting on socks and shoes; can't sleep at night. Checks sugar on/off (last value was 360 a few days ago) and notes stressors in her life that are interfering with her ability to take of herself. 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 She takes 45 units of Lantus around noon but forgets 3 times a week. She wakes up feeling dizzy and doesn't have an appetite but feels thirsty more than hungry (more water with some soda). She normally eats salad each day will eat shatever she can eat. Trying to exercise more regularly. She feels as though her medication non-adherence is the primary driver of her problem not her diet and is not interested in seeing the nutritionist at this time. She also reports blurry vision on the days her sugars run high and difficulty initiating urination.  Review of Systems  Constitutional: Negative for appetite change and unexpected weight change.  Respiratory: Positive for chest tightness.   Cardiovascular: Positive for chest pain.       Unsure if it's cardiac or due to her asthma   Endocrine: Positive for polydipsia.  Neurological: Positive for dizziness. Negative for  syncope.       Dizziness but is not any different than normal. Pain bilaterally with feet.  All other systems reviewed and are negative.      Objective:   Physical Exam  Constitutional: She is oriented to person, place, and time. She appears well-developed. No distress.  HENT:  Head: Normocephalic and atraumatic.  Eyes: Conjunctivae are normal. Pupils are equal, round, and reactive to light.  Cardiovascular: Regular rhythm and normal heart sounds.  Exam reveals no gallop and no friction rub.   No murmur heard. Pulmonary/Chest: Effort normal. No respiratory distress.  Neurological: She is alert and oriented to person, place, and time. No cranial nerve deficit.  Skin: Skin is warm and dry. No rash noted. She is not diaphoretic. No erythema. No pallor.  Quarter-sized bald area on her right scalp just lateral to her normal hairline. No lesions present.    Psychiatric: She has a normal mood and affect. Her behavior is normal.          Assessment & Plan:

## 2013-09-16 NOTE — Progress Notes (Signed)
I saw and evaluated the patient.  I personally confirmed the key portions of the history and exam documented by Dr. Patel and I reviewed pertinent patient test results.  The assessment, diagnosis, and plan were formulated together and I agree with the documentation in the resident's note. 

## 2013-09-16 NOTE — Patient Instructions (Signed)
Please make sure you take your insulin at the same time each day.  Please check your sugar when you wake up in the morning, when you go to sleep, and after each meal.  For the foot pain, please take gabapentin (Neurontin) 1 tablet each day. If the pain doesn't get better, take 2 tablets the next day. Do NOT take more than 3 tablets in one day.   I look forward to seeing you next week; please bring your glucometer and call the clinic if you have any questions. Thank you!    Gabapentin capsules or tablets What is this medicine? GABAPENTIN (GA ba pen tin) is used to control partial seizures in adults with epilepsy. It is also used to treat certain types of nerve pain. This medicine may be used for other purposes; ask your health care provider or pharmacist if you have questions. COMMON BRAND NAME(S): Orpha Bur, Neurontin What should I tell my health care provider before I take this medicine? They need to know if you have any of these conditions: -kidney disease -suicidal thoughts, plans, or attempt; a previous suicide attempt by you or a family member -an unusual or allergic reaction to gabapentin, other medicines, foods, dyes, or preservatives -pregnant or trying to get pregnant -breast-feeding How should I use this medicine? Take this medicine by mouth with a glass of water. Follow the directions on the prescription label. You can take it with or without food. If it upsets your stomach, take it with food.Take your medicine at regular intervals. Do not take it more often than directed. Do not stop taking except on your doctor's advice. If you are directed to break the 600 or 800 mg tablets in half as part of your dose, the extra half tablet should be used for the next dose. If you have not used the extra half tablet within 28 days, it should be thrown away. A special MedGuide will be given to you by the pharmacist with each prescription and refill. Be sure to read this information carefully  each time. Talk to your pediatrician regarding the use of this medicine in children. Special care may be needed. Overdosage: If you think you have taken too much of this medicine contact a poison control center or emergency room at once. NOTE: This medicine is only for you. Do not share this medicine with others. What if I miss a dose? If you miss a dose, take it as soon as you can. If it is almost time for your next dose, take only that dose. Do not take double or extra doses. What may interact with this medicine? Do not take this medicine with any of the following medications: -other gabapentin products This medicine may also interact with the following medications: -alcohol -antacids -antihistamines for allergy, cough and cold -certain medicines for anxiety or sleep -certain medicines for depression or psychotic disturbances -homatropine; hydrocodone -naproxen -narcotic medicines (opiates) for pain -phenothiazines like chlorpromazine, mesoridazine, prochlorperazine, thioridazine This list may not describe all possible interactions. Give your health care provider a list of all the medicines, herbs, non-prescription drugs, or dietary supplements you use. Also tell them if you smoke, drink alcohol, or use illegal drugs. Some items may interact with your medicine. What should I watch for while using this medicine? Visit your doctor or health care professional for regular checks on your progress. You may want to keep a record at home of how you feel your condition is responding to treatment. You may want to share this information  with your doctor or health care professional at each visit. You should contact your doctor or health care professional if your seizures get worse or if you have any new types of seizures. Do not stop taking this medicine or any of your seizure medicines unless instructed by your doctor or health care professional. Stopping your medicine suddenly can increase your seizures  or their severity. Wear a medical identification bracelet or chain if you are taking this medicine for seizures, and carry a card that lists all your medications. You may get drowsy, dizzy, or have blurred vision. Do not drive, use machinery, or do anything that needs mental alertness until you know how this medicine affects you. To reduce dizzy or fainting spells, do not sit or stand up quickly, especially if you are an older patient. Alcohol can increase drowsiness and dizziness. Avoid alcoholic drinks. Your mouth may get dry. Chewing sugarless gum or sucking hard candy, and drinking plenty of water will help. The use of this medicine may increase the chance of suicidal thoughts or actions. Pay special attention to how you are responding while on this medicine. Any worsening of mood, or thoughts of suicide or dying should be reported to your health care professional right away. Women who become pregnant while using this medicine may enroll in the [place] Pregnancy Registry by calling [id number]. This registry collects information about the safety of antiepileptic drug use during pregnancy. What side effects may I notice from receiving this medicine? Side effects that you should report to your doctor or health care professional as soon as possible: -allergic reactions like skin rash, itching or hives, swelling of the face, lips, or tongue -worsening of mood, thoughts or actions of suicide or dying Side effects that usually do not require medical attention (report to your doctor or health care professional if they continue or are bothersome): -constipation -difficulty walking or controlling muscle movements -dizziness -nausea -slurred speech -tiredness -tremors -weight gain This list may not describe all possible side effects. Call your doctor for medical advice about side effects. You may report side effects to FDA at 1-800-FDA-1088. Where should I keep my  medicine? Keep out of reach of children. Store at room temperature between 15 and 30 degrees C (59 and 86 degrees F). Throw away any unused medicine after the expiration date. NOTE: This sheet is a summary. It may not cover all possible information. If you have questions about this medicine, talk to your doctor, pharmacist, or health care provider.  2015, [employer]. (2012-10-24 09:12:48)

## 2013-09-16 NOTE — Assessment & Plan Note (Addendum)
Assessment -Diagnosis of exclusion based on subacute nature and lack of progression -Causing patient significant distress as she now wears a wig to cover her patch  Plan -Continue assessing at each visit -Check TSH today though hypothyroidism less likely  ADDENDUM 7/15: TSH 1.4 so hypothyroidism less likely as cause of her hair loss

## 2013-09-16 NOTE — Assessment & Plan Note (Addendum)
Lab Results  Component Value Date   HGBA1C >14.0 09/16/2013   HGBA1C >14.0 01/02/2013   HGBA1C >14.0 10/11/2012    Assessment: Diabetes control: poor control (HgbA1C >9%) Progress toward A1C goal:  deteriorated Comments:  -Patient is showing signs of end-organ damage (neuropathic pain, blurry vision, elevated urine microalbumin/crt ratio, difficulty initiating urine 2/2 possible autonomic dysfxn).  -She recognizes her non-adherence and is aware of the complications of this disease. -Additional barriers for her include cost as she only recently got approved for Medicaid.  Plan: Medications:  Lantus (45 units) Home glucose monitoring: Frequency: once a day (inconsistent) Timing:  (variable) Instruction/counseling given: reminded to bring blood glucose meter & log to each visit Educational resources provided:   Self management tools provided: instructions for home glucose monitoring Other plans:  -Completed foot exam today -Rechecked BMET, urine microalbumin/crt, and lipid panel today -Advised patient to take her insulin regularly this week and check her sugars after each meal, in the morning, and at bedtime  -Asked her to return in 7 days with glucometer to better assess her control and whether or not we need to change her regimen   -Prescribed gabapentin 100mg  and advised patient to increase BID->TID should it not help her pain  -Deferred starting ACEi today despite prior urine microalbumin/creatinine ratio given that she needs to establish glycemic control first  ADDENDUM 7/15: Urine microalbumin/crt ratio 87.9 which is elevated and consistent with prior measurements thus warranting ACEi therapy; Crt stable from prior year (0.79->0.8) -Na 133 which is unchanged from prior values; likely due to pseudohyperglycemia given her persistently elevated glucose

## 2013-09-17 LAB — BASIC METABOLIC PANEL WITH GFR
BUN: 7 mg/dL (ref 6–23)
CHLORIDE: 98 meq/L (ref 96–112)
CO2: 26 meq/L (ref 19–32)
Calcium: 9.3 mg/dL (ref 8.4–10.5)
Creat: 0.8 mg/dL (ref 0.50–1.10)
GFR, Est African American: >89 mL/min
GFR, Est Non African American: >89 mL/min
Glucose, Bld: 353 mg/dL — ABNORMAL HIGH (ref 70–99)
Potassium: 4.1 mEq/L (ref 3.5–5.3)
Sodium: 133 mEq/L — ABNORMAL LOW (ref 135–145)

## 2013-09-17 LAB — LIPID PANEL
CHOL/HDL RATIO: 5 ratio
Cholesterol: 208 mg/dL — ABNORMAL HIGH (ref 0–200)
HDL: 42 mg/dL (ref >39)
LDL CALC: 139 mg/dL — AB (ref 0–99)
Triglycerides: 137 mg/dL (ref <150)
VLDL: 27 mg/dL (ref 0–40)

## 2013-09-17 LAB — MICROALBUMIN / CREATININE URINE RATIO
Creatinine, Urine: 59.7 mg/dL
MICROALB UR: 5.25 mg/dL — AB (ref 0.00–1.89)
Microalb Creat Ratio: 87.9 mg/g — ABNORMAL HIGH (ref 0.0–30.0)

## 2013-09-17 NOTE — Assessment & Plan Note (Signed)
Assessment -LDL increased from last year (109->139) -Patient does not believe diet has an effect on her overall disease state  Plan -Consider statin therapy after establishing glycemic control -Will require close follow-up and patient education

## 2013-09-24 ENCOUNTER — Ambulatory Visit (INDEPENDENT_AMBULATORY_CARE_PROVIDER_SITE_OTHER): Payer: Medicaid Other | Admitting: Internal Medicine

## 2013-09-24 ENCOUNTER — Ambulatory Visit: Payer: Medicaid Other | Admitting: Internal Medicine

## 2013-09-24 ENCOUNTER — Encounter: Payer: Self-pay | Admitting: Internal Medicine

## 2013-09-24 VITALS — BP 118/80 | HR 75 | Temp 97.2°F | Ht 60.0 in | Wt 154.2 lb

## 2013-09-24 DIAGNOSIS — E1165 Type 2 diabetes mellitus with hyperglycemia: Secondary | ICD-10-CM

## 2013-09-24 DIAGNOSIS — R109 Unspecified abdominal pain: Secondary | ICD-10-CM

## 2013-09-24 DIAGNOSIS — E118 Type 2 diabetes mellitus with unspecified complications: Principal | ICD-10-CM

## 2013-09-24 DIAGNOSIS — IMO0002 Reserved for concepts with insufficient information to code with codable children: Secondary | ICD-10-CM

## 2013-09-24 DIAGNOSIS — M25551 Pain in right hip: Secondary | ICD-10-CM | POA: Insufficient documentation

## 2013-09-24 LAB — CBC WITH DIFFERENTIAL/PLATELET
BASOS ABS: 0 10*3/uL (ref 0.0–0.1)
Basophils Relative: 1 % (ref 0–1)
Eosinophils Absolute: 0.2 10*3/uL (ref 0.0–0.7)
Eosinophils Relative: 6 % — ABNORMAL HIGH (ref 0–5)
HEMATOCRIT: 40.1 % (ref 36.0–46.0)
HEMOGLOBIN: 12.6 g/dL (ref 12.0–15.0)
LYMPHS PCT: 36 % (ref 12–46)
Lymphs Abs: 1.5 10*3/uL (ref 0.7–4.0)
MCH: 22.8 pg — ABNORMAL LOW (ref 26.0–34.0)
MCHC: 31.4 g/dL (ref 30.0–36.0)
MCV: 72.5 fL — ABNORMAL LOW (ref 78.0–100.0)
Monocytes Absolute: 0.4 10*3/uL (ref 0.1–1.0)
Monocytes Relative: 9 % (ref 3–12)
NEUTROS ABS: 2 10*3/uL (ref 1.7–7.7)
Neutrophils Relative %: 48 % (ref 43–77)
Platelets: 217 10*3/uL (ref 150–400)
RBC: 5.53 MIL/uL — ABNORMAL HIGH (ref 3.87–5.11)
RDW: 16.3 % — ABNORMAL HIGH (ref 11.5–15.5)
WBC: 4.1 10*3/uL (ref 4.0–10.5)

## 2013-09-24 LAB — COMPLETE METABOLIC PANEL WITH GFR
ALK PHOS: 92 U/L (ref 39–117)
ALT: 16 U/L (ref 0–35)
AST: 13 U/L (ref 0–37)
Albumin: 3.7 g/dL (ref 3.5–5.2)
BILIRUBIN TOTAL: 0.4 mg/dL (ref 0.2–1.2)
BUN: 11 mg/dL (ref 6–23)
CO2: 27 mEq/L (ref 19–32)
CREATININE: 0.72 mg/dL (ref 0.50–1.10)
Calcium: 9 mg/dL (ref 8.4–10.5)
Chloride: 98 mEq/L (ref 96–112)
GFR, Est African American: >89 mL/min
GLUCOSE: 321 mg/dL — AB (ref 70–99)
Potassium: 4 mEq/L (ref 3.5–5.3)
Sodium: 133 mEq/L — ABNORMAL LOW (ref 135–145)
Total Protein: 6.8 g/dL (ref 6.0–8.3)

## 2013-09-24 LAB — GLUCOSE, CAPILLARY: GLUCOSE-CAPILLARY: 304 mg/dL — AB (ref 70–99)

## 2013-09-24 MED ORDER — INSULIN GLARGINE 100 UNITS/ML SOLOSTAR PEN: 45.0000 [IU] | PEN_INJECTOR | Freq: Every day | SUBCUTANEOUS | Status: DC

## 2013-09-24 MED ORDER — ONDANSETRON HCL 4 MG PO TABS: 4.0000 mg | ORAL_TABLET | Freq: Three times a day (TID) | ORAL | Status: DC | PRN

## 2013-09-24 MED ORDER — HYDROCODONE-ACETAMINOPHEN 5-325 MG PO TABS: 1.0000 | ORAL_TABLET | Freq: Two times a day (BID) | ORAL | Status: DC | PRN

## 2013-09-24 NOTE — Assessment & Plan Note (Signed)
Lab Results  Component Value Date   HGBA1C >14.0 09/16/2013   HGBA1C >14.0 01/02/2013   HGBA1C >14.0 10/11/2012     Assessment: Diabetes control: poor control (HgbA1C >9%) Progress toward A1C goal:  unchanged Comments: intermittently compliant cbg 304 pt admits did not take meds today or yesterday  Plan: Medications:  continue current medications (Lantus 45 units qd)-Rx refill today. Previous unable to tolerate Metformin in the past Home glucose monitoring: Frequency: 3 times a day Timing: before meals Instruction/counseling given: reminded to bring blood glucose meter & log to each visit and reminded to bring medications to each visit Educational resources provided: brochure Self management tools provided:   Other plans: encouraged compliance w/ cbg checks and meds; f/u in 2 weeks review meter readings if still hyperglycemic consider titration up of Lantus and/or consider addition of glipizide

## 2013-09-24 NOTE — Progress Notes (Signed)
Attending physician note: Presenting problems, physical findings, medications, problem assessment plan, reviewed with resident physician Dr. Karlyn Agee and I concur with the management plan. Murriel Hopper, M.D., [place]

## 2013-09-24 NOTE — Assessment & Plan Note (Signed)
Unclear etiology will r/o GB disease vs appendicitis with US abdomen. Previously pt had chronic n/v which was thought to be ? Gastroparesis but never had gastric emptying study Rx Zofran prn nausea control Rx Norco bid prn #30 pain control CMET, CBC, UA today, and will order US abdomen.  RTC in 2 weeks

## 2013-09-24 NOTE — Progress Notes (Signed)
   Subjective:    [name], female    DOB: 1970/06/18, 43 y.o.   MRN: [id number]  HPI Comments: 43 y.o PMH DM w/ neuropathy, asthma, depression, fibromyalgia, HLD  She presents for f/u: BP 118/80 afebrile 1. DM 2-she is intermittently compliant with Lantus 45 units daily and since last visit has been checking cbgs intermittently though meter date is inaccurate and reset to correct date today.  cbg readings are 260s-430s.  Today cbg is 304.  She needs Rx refill of Lantus 45 units  2. She c/o right mid quadrant ab pain x 2 weeks (w/o radiation) associated with nausea, vomiting, decreased po intake b/c when she tries to eat she feels nauseated.  Pain feels sharp, throbbing and is intermitttent. Pian is 8/10.  No relief with NSAIDs, min. Relief of nausea with min.  Denies fever, chills, dysuria.  LMP 09/16/13-still w/ menstrual cycle but last noted she had big "clots"     Review of Systems  Gastrointestinal: Positive for nausea, vomiting and abdominal pain. Negative for diarrhea and constipation.  Genitourinary: Negative for dysuria.       Objective:   Physical Exam  Nursing note and vitals reviewed. Constitutional: She is oriented to person, place, and time. Vital signs are normal. She appears well-developed and well-nourished. She is cooperative. No distress.  HENT:  Head: Normocephalic and atraumatic.  Mouth/Throat: Oropharynx is clear and moist and mucous membranes are normal. No oropharyngeal exudate.  Eyes: Conjunctivae are normal. Right eye exhibits no discharge. Left eye exhibits no discharge. No scleral icterus.  Cardiovascular: Normal rate, regular rhythm and normal heart sounds.   No murmur heard. No lower ext edema   Pulmonary/Chest: Effort normal and breath sounds normal. No respiratory distress. She has no wheezes.  Abdominal: Soft. Bowel sounds are normal. There is tenderness.    Negative Rovsings sign  Neurological: She is alert and oriented to person,  place, and time. Gait normal.  Skin: Skin is warm, dry and intact. No rash noted. She is not diaphoretic.  Psychiatric: She has a normal mood and affect. Her speech is normal and behavior is normal. Judgment and thought content normal. Cognition and memory are normal.          Assessment & Plan:  F/u in 2 weeks DM 2(review meter and adjust meds if needed), ab pain

## 2013-09-24 NOTE — Patient Instructions (Signed)
General Instructions: Get Ultrasound of your abdomen as soon as possible Take Zofran for nausea and Norco for pain as needed Follow up in 2 weeks bring meter and medications Take care    Treatment Goals:  Goals (1 Years of Data) as of 09/24/13         09/16/13 01/02/13 10/11/12 05/15/12 03/29/11     Lifestyle    . Prevent Falls           Result Component    . HEMOGLOBIN A1C < 7.0  >14.0 >14.0 >14.0 >14.0     . LDL CALC < 100  139  109  98      Progress Toward Treatment Goals:  Treatment Goal 09/24/2013  Hemoglobin A1C unchanged  Prevent falls -    Self Care Goals & Plans:  Self Care Goal 09/24/2013  Manage my medications take my medicines as prescribed; bring my medications to every visit; refill my medications on time  Monitor my health keep track of my blood glucose; bring my glucose meter and log to each visit; check my feet daily; keep track of my weight  Eat healthy foods drink diet soda or water instead of juice or soda; eat more vegetables; eat foods that are low in salt; eat baked foods instead of fried foods; eat fruit for snacks and desserts; eat smaller portions  Be physically active find an activity I enjoy  Meeting treatment goals maintain the current self-care plan    Home Blood Glucose Monitoring 09/24/2013  Check my blood sugar 3 times a day  When to check my blood sugar before meals     Care Management & Community Referrals:  Referral 09/24/2013  Referrals made for care management support -  Referrals made to community resources none

## 2013-09-25 ENCOUNTER — Encounter: Payer: Self-pay | Admitting: Internal Medicine

## 2013-09-25 LAB — URINALYSIS, ROUTINE W REFLEX MICROSCOPIC
Bilirubin Urine: NEGATIVE
Glucose, UA: >1000 mg/dL — AB
Hgb urine dipstick: NEGATIVE
Ketones, ur: NEGATIVE mg/dL
Leukocytes, UA: NEGATIVE
NITRITE: NEGATIVE
Protein, ur: NEGATIVE mg/dL
UROBILINOGEN UA: 0.2 mg/dL (ref 0.0–1.0)
pH: 5.5 (ref 5.0–8.0)

## 2013-09-25 LAB — URINALYSIS, MICROSCOPIC ONLY
Bacteria, UA: NONE SEEN
Casts: NONE SEEN
Crystals: NONE SEEN

## 2013-10-01 ENCOUNTER — Ambulatory Visit (INDEPENDENT_AMBULATORY_CARE_PROVIDER_SITE_OTHER): Payer: Medicaid Other | Admitting: Internal Medicine

## 2013-10-01 ENCOUNTER — Encounter: Payer: Self-pay | Admitting: Internal Medicine

## 2013-10-01 VITALS — BP 110/76 | HR 78 | Temp 98.0°F | Ht 63.0 in | Wt 155.2 lb

## 2013-10-01 DIAGNOSIS — E118 Type 2 diabetes mellitus with unspecified complications: Secondary | ICD-10-CM

## 2013-10-01 DIAGNOSIS — IMO0002 Reserved for concepts with insufficient information to code with codable children: Secondary | ICD-10-CM

## 2013-10-01 DIAGNOSIS — E1165 Type 2 diabetes mellitus with hyperglycemia: Secondary | ICD-10-CM

## 2013-10-01 DIAGNOSIS — R109 Unspecified abdominal pain: Secondary | ICD-10-CM

## 2013-10-01 LAB — VITAMIN B12: Vitamin B-12: 396 pg/mL (ref 211–911)

## 2013-10-01 LAB — GLUCOSE, CAPILLARY: GLUCOSE-CAPILLARY: 239 mg/dL — AB (ref 70–99)

## 2013-10-01 MED ORDER — DULOXETINE HCL 30 MG PO CPEP: 30.0000 mg | ORAL_CAPSULE | Freq: Every day | ORAL | Status: DC

## 2013-10-01 NOTE — Progress Notes (Signed)
   Subjective:    [name], female    DOB: June 20, 1970, 43 y.o.   MRN: [id number]  HPI Ms. Deyarmin is a 43 year old female with DM2 with neuropathy; asthma, depression, fibromyalgia, and hyperlipidemia  Right hip pain: Pain had started prior to last office visit on 7/22 where it was described as more of an abdominal pain and is scheduled for ab [name] tomorrow. Today, she notes it as a hip pain. She feels it while sitting down and is keeping her up at night. She can't really describe how it feels other than it's a pain shooting down behind her right leg. Her hip was broken when she was 43 years old and has had chronic problems with it since. She finds it difficult to stand and seems to feel tired all the time. Norco received at the last visit has not seemed to help her pain at all.   Otherwise, she notes constant nausea and an inability to eat and dizziness that she thinks is related to her sugars running high. She denies any numbness on her leg or recent trauma.   Review of Systems  Constitutional: Positive for activity change, appetite change and fatigue.  Gastrointestinal: Negative for abdominal pain.  Musculoskeletal: Positive for myalgias.  Neurological: Negative for dizziness and numbness.  All other systems reviewed and are negative.      Objective:   Physical Exam  Constitutional: She is oriented to person, place, and time. She appears well-developed and well-nourished. No distress.  HENT:  Head: Normocephalic and atraumatic.  Eyes: Conjunctivae are normal. Pupils are equal, round, and reactive to light.  Cardiovascular: Normal rate, regular rhythm and normal heart sounds.  Exam reveals no gallop and no friction rub.   No murmur heard. Pulmonary/Chest: Effort normal. No respiratory distress. She has no wheezes. She has no rales.  Abdominal: Soft. Bowel sounds are normal. She exhibits no distension. There is no tenderness.  MSK: 5/5 LE strength bilaterally. Straight  leg test does not elicit pain bilaterally. External rotation of hip does not elicit pain bilaterally. Neurological: She is alert and oriented to person, place, and time. No cranial nerve deficit. Coordination normal. Romberg test unremarkable. Skin: 3-cm x 3-cm round lesion present on her right anteriolateral thigh that is tender to palpation.  Psychiatric: Her behavior is normal.       Assessment & Plan:

## 2013-10-01 NOTE — Patient Instructions (Signed)
Please follow-up in 1 week to see how you are doing with the Cymbalta.   Also, we will put in a referral to see rheumatology and will call you with the appointment.  To refill your strips, please call us back with the name of your glucometer so we can prescribe the right strips.  Thank you!

## 2013-10-02 LAB — FOLATE RBC: RBC FOLATE: 736 ng/mL (ref >280)

## 2013-10-02 NOTE — Assessment & Plan Note (Addendum)
Assessment -Her symptoms are definitely troubling given that she it is causing her significant distress. -Possibly related to her fibromyalgia given her long history of pain of mostly unremarkable labs/imaging -She was seeing a rheumatologist for some time for consideration of DMARD therapy for possible RA (serologies never confirmed her symptoms) -Lesion on her right thigh does cause her pain but is different from the other pain that prevents her from standing -Less likely neurologic given Romberg- and absence of numbness/tingling but possible given dizziness and inability to stand -Less likely bone-related given preserved external rotation of her legs -Less likely muscular given no pain on exam with strength testing  Plan -Consider checking B12/folate-->both unremarkable -Refer to rheumatology for further evaluation -Stop amitryptiline and start Cymbalta with reassessment in one week

## 2013-10-03 ENCOUNTER — Ambulatory Visit (HOSPITAL_COMMUNITY): Payer: Medicaid Other

## 2013-10-03 NOTE — Progress Notes (Signed)
I saw and evaluated the patient.  I personally confirmed the key portions of the history and exam documented by Dr. Patel and I reviewed pertinent patient test results.  The assessment, diagnosis, and plan were formulated together and I agree with the documentation in the resident's note. 

## 2013-10-14 ENCOUNTER — Ambulatory Visit (INDEPENDENT_AMBULATORY_CARE_PROVIDER_SITE_OTHER): Payer: Medicaid Other | Admitting: Internal Medicine

## 2013-10-14 ENCOUNTER — Encounter: Payer: Self-pay | Admitting: Internal Medicine

## 2013-10-14 VITALS — BP 106/75 | HR 87 | Temp 97.5°F | Ht 60.0 in | Wt 154.0 lb

## 2013-10-14 DIAGNOSIS — M25559 Pain in unspecified hip: Secondary | ICD-10-CM

## 2013-10-14 DIAGNOSIS — J45909 Unspecified asthma, uncomplicated: Secondary | ICD-10-CM

## 2013-10-14 DIAGNOSIS — IMO0002 Reserved for concepts with insufficient information to code with codable children: Secondary | ICD-10-CM

## 2013-10-14 DIAGNOSIS — M25551 Pain in right hip: Secondary | ICD-10-CM | POA: Insufficient documentation

## 2013-10-14 DIAGNOSIS — E118 Type 2 diabetes mellitus with unspecified complications: Principal | ICD-10-CM

## 2013-10-14 DIAGNOSIS — G609 Hereditary and idiopathic neuropathy, unspecified: Secondary | ICD-10-CM

## 2013-10-14 DIAGNOSIS — E1165 Type 2 diabetes mellitus with hyperglycemia: Secondary | ICD-10-CM

## 2013-10-14 DIAGNOSIS — M25552 Pain in left hip: Secondary | ICD-10-CM

## 2013-10-14 LAB — GLUCOSE, CAPILLARY: GLUCOSE-CAPILLARY: 289 mg/dL — AB (ref 70–99)

## 2013-10-14 MED ORDER — IBUPROFEN 200 MG PO TABS: 400.0000 mg | ORAL_TABLET | Freq: Four times a day (QID) | ORAL | Status: DC | PRN

## 2013-10-14 MED ORDER — PANTOPRAZOLE SODIUM 20 MG PO TBEC: 20.0000 mg | DELAYED_RELEASE_TABLET | Freq: Every day | ORAL | Status: DC

## 2013-10-14 MED ORDER — ALBUTEROL SULFATE HFA 108 (90 BASE) MCG/ACT IN AERS: 2.0000 | INHALATION_SPRAY | Freq: Four times a day (QID) | RESPIRATORY_TRACT | Status: DC | PRN

## 2013-10-14 NOTE — Progress Notes (Signed)
Subjective:   [name] female   DOB: 28-Oct-1970 43 y.o.   MRN: [id number]  HPI: [name] is a 43 y.o. y/o female w/ PMHx of uncontrolled DM type II (HbA1c >14) w/ peripheral neuropathy, fibromyalgia, asthma, arthritis (RF and anti-CCP positive), and depression, presents to the clinic today for a follow-up visit. She is following up for her continued hip pain. She was last seen in clinic on 10/01/13 for right hip pain, now claims her pain is bilateral, described as a throbbing pain w/ pins and needles, starting in the middle of her back and radiating down into the back of her legs all the way into her feet. She claims her pain has never been this way before. She has taken Norco for the pain, but says that this medicine makes her sick and she rarely takes it.  Additionally, [name] has an extensive history of very poorly controlled diabetes, most recent HbA1c >14.0 on 09/16/13. She takes Lantus 45 units around 11 AM, but claims she has not been using it every day and frequently forgets. According to her glucometer, her average blood sugar is 295, highest recorded 380, lowest 239, however, she claims she has had some 400's early in the AM. She had previously taken Metformin but says that this makes her sick.   Past Medical History  Diagnosis Date  . Peripheral neuropathy   . Fibromyalgia   . Episodic recurrent vertigo   . Vitamin D deficiency     history of  . Hyperlipidemia   . Diabetes mellitus   . Chronic rhinitis   . Depression   . Asthma   . Tinnitus     history of, Evaluated by ENT> [place]  . Rheumatoid arthritis(714.0)     seen by Rheumatology in [facility], Rheumatoid Factor pos, Anti CCP pos in 2005 -? early presentation of RF  . Obesity   . History of hip fracture     hx childhood hip fracture   Current Outpatient Prescriptions  Medication Sig Dispense Refill  . albuterol (PROVENTIL HFA;VENTOLIN HFA) 108 (90 BASE) MCG/ACT inhaler Inhale 2 puffs  into the lungs every 6 (six) hours as needed for wheezing or shortness of breath.      Marland Kitchen amitriptyline (ELAVIL) 25 MG tablet Take 1 tablet (25 mg total) by mouth at bedtime.  30 tablet  1  . DULoxetine (CYMBALTA) 30 MG capsule Take 1 capsule (30 mg total) by mouth daily.  15 capsule  0  . gabapentin (NEURONTIN) 100 MG capsule Take 1 capsule (100 mg total) by mouth daily. If pain doesn't improve, take 1 more tablet. Do NOT take more than 3 tablets.  30 capsule  2  . HYDROcodone-acetaminophen (NORCO) 5-325 MG per tablet Take 1-2 tablets by mouth 2 (two) times daily as needed for moderate pain or severe pain.  30 tablet  0  . insulin glargine (LANTUS) 100 unit/mL SOPN Inject 0.45 mLs (45 Units total) into the skin daily.  15 mL  3  . ondansetron (ZOFRAN) 4 MG tablet Take 1 tablet (4 mg total) by mouth every 8 (eight) hours as needed for nausea or vomiting.  42 tablet  0   No current facility-administered medications for this visit.   Family History  Problem Relation Age of Onset  . Hypertension Mother   . Diabetes Maternal Grandmother   . Arthritis Maternal Grandmother   . Kidney disease Maternal Grandmother    History   Social History  .  Marital Status: Legally Separated    Spouse Name: N/A    Number of Children: N/A  . Years of Education: 11   Occupational History  .     Social History Main Topics  . Smoking status: Never Smoker   . Smokeless tobacco: None  . Alcohol Use: Yes     Comment: ccasionally  . Drug Use: No  . Sexual Activity: None   Other Topics Concern  . None   Social History Narrative   [phone]    Review of Systems: General: Positive for fatigue. Denies fever, chills, diaphoresis, appetite change.  Respiratory: Denies SOB, DOE, cough, chest tightness, and wheezing.   Cardiovascular: Denies chest pain and palpitations.  Gastrointestinal: Positive for nausea, abdominal pain. Denies vomiting, diarrhea, constipation, blood in stool and abdominal distention.   Genitourinary: Denies dysuria, urgency, frequency, hematuria, and flank pain. Endocrine: Denies hot or cold intolerance, polyuria, and polydipsia. Musculoskeletal: Positive for back pain, bilateral hip pain, and leg pain. Denies joint swelling and gait problem.  Skin: Denies pallor, rash and wounds.  Neurological: Denies dizziness, seizures, syncope, weakness, lightheadedness, numbness and headaches.  Psychiatric/Behavioral: Denies mood changes, confusion, nervousness, sleep disturbance and agitation.  Objective:   Physical Exam: Filed Vitals:   10/14/13 1336  BP: 106/75  Pulse: 87  Temp: 97.5 F (36.4 C)  TempSrc: Oral  Height: 5' (1.524 m)  Weight: 154 lb (69.854 kg)  SpO2: 100%   General: Vital signs reviewed.  Patient is a well-developed and well-nourished, in no acute distress and cooperative with exam.  Head: Normocephalic and atraumatic. Eyes: PERRL, EOMI, conjunctivae normal, No scleral icterus. Wearing color contacts.  Neck: Supple, trachea midline, normal ROM, No JVD, masses, thyromegaly, or carotid bruit present.  Cardiovascular: RRR, S1 normal, S2 normal, no murmurs, gallops, or rubs. Pulmonary/Chest: Air entry equal bilaterally, no wheezes, rales, or rhonchi. Abdominal: Soft, non-tender, non-distended, BS +, no masses, organomegaly, or guarding present.  Musculoskeletal: Pain elicited in lumbar spine when lying flat on bed. TTP over lumbar spine and hips bilaterally. ROM intact w/ spinal rotation, flexion, and extension. NEGATIVE straight leg raise bilaterally. Pain elicited w/ knee and hip flexion bilaterally. ROM intact.  Extremities: No swelling or edema,  pulses symmetric and intact bilaterally. No cyanosis or clubbing. Neurological: A&O x3, Strength is normal and symmetric bilaterally, cranial nerve II-XII are grossly intact, no focal motor deficit, sensory intact to light touch bilaterally.  Skin: Warm, dry and intact. No rashes or erythema. Psychiatric: Somewhat  depressed mood and flat affect. Speech and behavior is normal. Cognition and memory are normal.   Assessment & Plan:   Please see problem based assessment and plan.

## 2013-10-14 NOTE — Patient Instructions (Signed)
General Instructions:  1. Please schedule a follow up appointment for 4-6 weeks.  2. Please take all medications as prescribed.   Have HIP XR tomorrow  Take Ibuprofen 400 mg 4 times as needed daily for pain  Take Protonix 20 mg daily for stomach upset   3. If you have worsening of your symptoms or new symptoms arise, please call the clinic [phone]), or go to the ER immediately if symptoms are severe.   Please bring your medicines with you each time you come to clinic.  Medicines may include prescription medications, over-the-counter medications, herbal remedies, eye drops, vitamins, or other pills.   Progress Toward Treatment Goals:  Treatment Goal 09/24/2013  Hemoglobin A1C unchanged  Prevent falls -    Self Care Goals & Plans:  Self Care Goal 10/14/2013  Manage my medications take my medicines as prescribed; bring my medications to every visit; refill my medications on time  Monitor my health keep track of my blood glucose; bring my glucose meter and log to each visit  Eat healthy foods drink diet soda or water instead of juice or soda; eat more vegetables; eat foods that are low in salt; eat baked foods instead of fried foods; eat fruit for snacks and desserts  Be physically active -  Meeting treatment goals -    Home Blood Glucose Monitoring 09/24/2013  Check my blood sugar 3 times a day  When to check my blood sugar before meals     Care Management & Community Referrals:  Referral 09/24/2013  Referrals made for care management support -  Referrals made to community resources none

## 2013-10-15 ENCOUNTER — Ambulatory Visit (HOSPITAL_COMMUNITY)
Admission: RE | Admit: 2013-10-15 | Discharge: 2013-10-15 | Disposition: A | Discharge status: Home / Self Care | Payer: Medicaid Other | Source: Ambulatory Visit | Attending: Internal Medicine | Admitting: Internal Medicine

## 2013-10-15 ENCOUNTER — Encounter: Payer: Self-pay | Admitting: Internal Medicine

## 2013-10-15 DIAGNOSIS — M25559 Pain in unspecified hip: Secondary | ICD-10-CM | POA: Diagnosis present

## 2013-10-15 DIAGNOSIS — M25551 Pain in right hip: Secondary | ICD-10-CM

## 2013-10-15 DIAGNOSIS — M25552 Pain in left hip: Secondary | ICD-10-CM

## 2013-10-15 MED ORDER — GLUCOSE BLOOD VI STRP: ORAL_STRIP | Status: DC

## 2013-10-15 NOTE — Assessment & Plan Note (Signed)
Refilled albuterol HFA today. No significant issues w/ SOB or wheezing recently. Claims she uses her inhaler infrequently.

## 2013-10-15 NOTE — Assessment & Plan Note (Addendum)
Patient's main complaint today. Describes the pain as starting in her lumbar spine and radiating into her hips and down the backs of her legs. On exam, negative straight leg raise bilaterally, making radiculopathy unlikely. Also do not suspect facet joint inflammation as arching the spine does not result in pain. Patient is only mildly tender to palpation over lumbar spine and hips bilaterally. Suspect that most of her pain is related to degenerative disease in the hips and lumbar spine, however, osteonecrosis is also a possibility. 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 Additionally, patient has been shown to be RF positive and anti-CCP positive as well, however, do not suspect that this is secondary to rheumatoid arthritis based on clinical description.  -Repeat bilateral hip XR -Start Ibuprofen 400 mg prn qid + Protonix 20 mg po qd for dyspepsia.  -Consider ESR + CRP if pain continues w/out sign of osteonecrosis or DJD -May also consider MRI spine to rule out facet joint inflammation

## 2013-10-15 NOTE — Assessment & Plan Note (Signed)
May be a significant component of her pain. Takes Neurontin, however, she claims this hurts her stomach.  -Continue Elavil -Emphasized insulin compliance as discussed in DM section.

## 2013-10-15 NOTE — Progress Notes (Signed)
Case discussed with Dr. Ronnald Ramp at the time of the visit.  We reviewed the resident's history and exam and pertinent patient test results.  I agree with the assessment, diagnosis and plan of care documented in the resident's note.  Of note, the hip/pelvis films were unremarkable.  Will reassess symptoms at the follow-up visit.  If avascular necrosis remains in the differential we will consider a CT of the hips to evaluate.

## 2013-10-15 NOTE — Assessment & Plan Note (Addendum)
Lab Results  Component Value Date   HGBA1C >14.0 09/16/2013   HGBA1C >14.0 01/02/2013   HGBA1C >14.0 10/11/2012     Assessment: Diabetes control: poor control (HgbA1C >9%) Progress toward A1C goal:  unchanged Comments: Patient is very non-compliant w/ her Lantus. Supposed to take 45 units daily which she says she tries to take about 11 AM, but frequently forgets. She has taken Metformin in the past but says this gives her significant dyspepsia and diarrhea. On the last 3 occasions of checking her HbA1c each time it has been >14. The patient's primary complaint during her visit today is back, hip, and leg pain, which I feel is very much multifactorial, however, I also feel that a significant component is 2/2 diabetic peripheral neuropathy.   Plan: Medications:  continue current medications; Patient is very upset about her leg pain and is VERY willing to take her Insulin if she thinks this will improve her leg pain. She agreed to start using Lantus 45 units every night before going to bed and we both agreed that she may remember to use her insulin more regularly if she gives herself a reminder before going to sleep.  Home glucose monitoring: Frequency: 3 times a day Timing: before breakfast;before lunch;before dinner Instruction/counseling given: reminded to bring medications to each visit Educational resources provided: brochure Self management tools provided: copy of home glucose meter download Other plans: Compliance is the main issue for Ms Tobon. I feel that she will improve her Insulin use now that she knows that some of her pain is related to uncontrolled diabetes. -Return to clinic October for HbA1c check.  -Will consider changing regimen if she continues to not use insulin.

## 2013-10-20 ENCOUNTER — Ambulatory Visit: Payer: Medicaid Other | Admitting: Internal Medicine

## 2013-10-21 ENCOUNTER — Ambulatory Visit: Payer: Medicaid Other | Admitting: Internal Medicine

## 2013-10-22 ENCOUNTER — Encounter: Payer: Self-pay | Admitting: Internal Medicine

## 2013-10-22 ENCOUNTER — Ambulatory Visit (INDEPENDENT_AMBULATORY_CARE_PROVIDER_SITE_OTHER): Payer: Medicaid Other | Admitting: Internal Medicine

## 2013-10-22 VITALS — BP 109/72 | HR 82 | Temp 99.1°F | Ht 63.0 in | Wt 156.6 lb

## 2013-10-22 DIAGNOSIS — E1165 Type 2 diabetes mellitus with hyperglycemia: Secondary | ICD-10-CM

## 2013-10-22 DIAGNOSIS — IMO0002 Reserved for concepts with insufficient information to code with codable children: Secondary | ICD-10-CM

## 2013-10-22 DIAGNOSIS — M25551 Pain in right hip: Secondary | ICD-10-CM

## 2013-10-22 DIAGNOSIS — M25552 Pain in left hip: Principal | ICD-10-CM

## 2013-10-22 DIAGNOSIS — E118 Type 2 diabetes mellitus with unspecified complications: Secondary | ICD-10-CM

## 2013-10-22 DIAGNOSIS — M25559 Pain in unspecified hip: Secondary | ICD-10-CM

## 2013-10-22 LAB — GLUCOSE, CAPILLARY: GLUCOSE-CAPILLARY: 312 mg/dL — AB (ref 70–99)

## 2013-10-22 NOTE — Assessment & Plan Note (Addendum)
Pt reports bilateral hip pain that has gotten worse.  10/15/13 Bilateral Hip/Pelvis XR without any acute findings.  I told her about those results.  She initially states that she heard me talking to the nurse asking her what she was there for.  I told her that it is customary to discuss patients with nurses to find out their problems and the reason for the visit.  She is angry about this and states she doesn't appreciate me talking to the nurse about her.  She has a h/o depression although she denies this.  She becomes extremely agitated that I asked her about this and states "I'm not crazy."  I discussed her uncontrolled DM and tried to focus on this since her last HA1c was >14 but she became angry at this as well.  I attempted to explain to her that an HA1c that high is damaging to the nerves but she didn't want to hear that.  Has a questionable history of RA and fibromyalgia.  She does not ask for pain meds but requests referral to rheumatology (has seen Dr. Trudie Reed in the past).  She walks out of the exam room without a proper exam stating that "I'm leaving."   I would proceed cautiously in the future with this patient as she has a tendency to be volatile.  -referral to rheumatology -referral to [facility], CDE for better DM control  -follow up with PCP

## 2013-10-22 NOTE — Progress Notes (Signed)
[name], female   DOB: 1970-08-11, 43 y.o.   MRN: [id number]    Subjective:   [name] female    DOB: 30-Jan-1971 43 y.o.    MRN: [id number] Health Maintenance Due: Health Maintenance Due  Topic Date Due  . Pap Smear  02/24/2013  . Influenza Vaccine  10/04/2013    _________________________________________________  HPI: Adriana Burke is a 43 y.o. female here for an acute visit for pain. Pt has a PMH outlined below.  Please see problem-based charting assessment and plan note for further details of medical issues addressed at today's visit.  PMH: Past Medical History  Diagnosis Date  . Peripheral neuropathy   . Fibromyalgia   . Episodic recurrent vertigo   . Vitamin D deficiency     history of  . Hyperlipidemia   . Diabetes mellitus   . Chronic rhinitis   . Depression   . Asthma   . Tinnitus     history of, Evaluated by ENT> [place]  . Rheumatoid arthritis(714.0)     seen by Rheumatology in [facility], Rheumatoid Factor pos, Anti CCP pos in 2005 -? early presentation of RF  . Obesity   . History of hip fracture     hx childhood hip fracture    Medications: Current Outpatient Prescriptions on File Prior to Visit  Medication Sig Dispense Refill  . albuterol (PROVENTIL HFA;VENTOLIN HFA) 108 (90 BASE) MCG/ACT inhaler Inhale 2 puffs into the lungs every 6 (six) hours as needed for wheezing or shortness of breath.  1 Inhaler  5  . gabapentin (NEURONTIN) 100 MG capsule Take 1 capsule (100 mg total) by mouth daily. If pain doesn't improve, take 1 more tablet. Do NOT take more than 3 tablets.  30 capsule  2  . glucose blood test strip Use as instructed  100 each  12  . HYDROcodone-acetaminophen (NORCO) 5-325 MG per tablet Take 1-2 tablets by mouth 2 (two) times daily as needed for moderate pain or severe pain.  30 tablet  0  . ibuprofen ([employer] IBUPROFEN) 200 MG tablet Take 2 tablets (400 mg total) by mouth every 6 (six) hours as needed.  30  tablet  0  . insulin glargine (LANTUS) 100 unit/mL SOPN Inject 0.45 mLs (45 Units total) into the skin daily.  15 mL  3  . pantoprazole (PROTONIX) 20 MG tablet Take 1 tablet (20 mg total) by mouth daily.  30 tablet  1   No current facility-administered medications on file prior to visit.    Allergies: Allergies  Allergen Reactions  . Metformin And Related     N/v     FH: Family History  Problem Relation Age of Onset  . Hypertension Mother   . Diabetes Maternal Grandmother   . Arthritis Maternal Grandmother   . Kidney disease Maternal Grandmother     SH: History   Social History  . Marital Status: Legally Separated    Spouse Name: N/A    Number of Children: N/A  . Years of Education: 11   Occupational History  .     Social History Main Topics  . Smoking status: Never Smoker   . Smokeless tobacco: None  . Alcohol Use: Yes     Comment: ccasionally  . Drug Use: No  . Sexual Activity: None   Other Topics Concern  . None   Social History Narrative   [phone]    Review of Systems: Constitutional: Negative for fever, chills and  weight loss.  Eyes: Negative for blurred vision.  Respiratory: Negative for cough and shortness of breath.  Cardiovascular: Negative for chest pain, palpitations and leg swelling.  Gastrointestinal: Negative for nausea, vomiting, abdominal pain, diarrhea, constipation and blood in stool.  Genitourinary: Negative for dysuria, urgency and frequency.  Musculoskeletal: +myalgias and back pain.  Neurological: Negative for dizziness, weakness and headaches.     Objective:   Vital Signs: Filed Vitals:   10/22/13 1430  BP: 109/72  Pulse: 82  Temp: 99.1 F (37.3 C)  TempSrc: Oral  Height: 5\' 3"  (1.6 m)  Weight: 156 lb 9.6 oz (71.033 kg)  SpO2: 100%      BP Readings from Last 3 Encounters:  10/22/13 109/72  10/14/13 106/75  10/01/13 110/76    Physical Exam: Limited because patient left abruptly.  Constitutional: Vital signs  reviewed.  Patient is well-developed and well-nourished in NAD.  She is very agitated and has a flat affect.  Is extremely volatile.     Head: Normocephalic and atraumatic.   Assessment & Plan:   Assessment and plan was discussed and formulated with my attending.

## 2013-10-23 NOTE — Assessment & Plan Note (Signed)
Pt completed not interested in discusses her uncontrolled DM.  In fact, she gets extremely agitated and angry at me when I brought this up.  She reports "trying to take her insulin now." -referral to [employer] , CDE

## 2013-10-27 NOTE — Progress Notes (Signed)
I discussed this case with the resident Dr. Gordy Levan but patient left prior to a proper physical exam and we did not have an opportunity to discuss all her medical issues with her. I agree with the recommendations outlined in [place] note

## 2013-11-11 ENCOUNTER — Encounter: Payer: Medicaid Other | Admitting: Internal Medicine

## 2013-11-11 ENCOUNTER — Encounter: Payer: Medicaid Other | Admitting: Dietician

## 2013-11-17 NOTE — Addendum Note (Signed)
Addended by: Hulan Fray on: 11/17/2013 08:10 PM   Modules accepted: Orders

## 2013-12-02 ENCOUNTER — Encounter: Payer: Self-pay | Admitting: Internal Medicine

## 2013-12-02 ENCOUNTER — Ambulatory Visit (INDEPENDENT_AMBULATORY_CARE_PROVIDER_SITE_OTHER): Payer: Medicaid Other | Admitting: Internal Medicine

## 2013-12-02 VITALS — BP 124/76 | HR 87 | Temp 97.9°F | Ht 64.0 in | Wt 157.3 lb

## 2013-12-02 DIAGNOSIS — E118 Type 2 diabetes mellitus with unspecified complications: Secondary | ICD-10-CM

## 2013-12-02 DIAGNOSIS — N6019 Diffuse cystic mastopathy of unspecified breast: Secondary | ICD-10-CM

## 2013-12-02 DIAGNOSIS — IMO0002 Reserved for concepts with insufficient information to code with codable children: Secondary | ICD-10-CM

## 2013-12-02 DIAGNOSIS — N6011 Diffuse cystic mastopathy of right breast: Secondary | ICD-10-CM

## 2013-12-02 DIAGNOSIS — E1165 Type 2 diabetes mellitus with hyperglycemia: Secondary | ICD-10-CM

## 2013-12-02 LAB — GLUCOSE, CAPILLARY: Glucose-Capillary: 374 mg/dL — ABNORMAL HIGH (ref 70–99)

## 2013-12-02 NOTE — Patient Instructions (Signed)
PATIENT INSTRUCTIONS FIBROCYSTIC BREAST DISEASE  FOLLOW-UP:  Please make an appointment with your physician in 2- 4 week(s).  Call your physician should you develop a new breast mass that is different, if one particular lump starts to enlarge, or if nipple discharge develops.   CAUSE:  Many women have some lumpiness within their breasts and these areas at times can become tender during certain times in your menstrual cycle.  These areas tend to feel like a firm rubber ball as compared to a cancer which will more commonly feel hard and almost rock-like.  Fibrocystic breast disease does not in and of itself increase your risk for breast cancer but you should be sure to examine yiour breasts at the same time of the month on a monthly basis.  If there are a lot of areas of lumpiness you should tape a piece of paper on the mirror with a diagram of your breasts, noting where the areas of lumpiness are and their relative size.  You can then refer to this diagram on a monthly basis to keep better track of any changes should they occur.  DIET:  You should try and avoid foods, or at least minimize foods, such as chocolate and caffeine which may cause the symptoms of tenderness to become worse.  ACTIVITY:  You may want to wear a bra that offers additional support, and/or consider a sports bra, especially during those times when your breasts are more tender.  MEDICATIONS:  Taking Vitamin E capsules twice a day along with Evening of Primrose Capsules three times a day, or as directed on the bottle, may help your symptoms.  These are both available over-the-counter and without a prescription. There is clinical evidence that these may help symptoms in some patients.   If your physician has prescribed medication for your fibrocystic breast disease, be sure to take it as instructed on the bottle and let him/her know if you have any side effects.  TAKE TYLENOL or NSAIDS for pain.   QUESTIONS:  Please feel free to call  your physician  if you have any questions, and they will be glad to assist you.

## 2013-12-03 ENCOUNTER — Encounter: Payer: Self-pay | Admitting: Internal Medicine

## 2013-12-03 DIAGNOSIS — N6011 Diffuse cystic mastopathy of right breast: Secondary | ICD-10-CM | POA: Insufficient documentation

## 2013-12-03 NOTE — Assessment & Plan Note (Signed)
Lab Results  Component Value Date   HGBA1C >14.0 09/16/2013   HGBA1C >14.0 01/02/2013   HGBA1C >14.0 10/11/2012     Assessment: Comments: Patient w/ historically poorly controlled DM. Most recent HbA1c >14. H/o non-compliance. Uses Lantus 45 units daily, but says she forgets very frequently. Did not discuss DM today.   Plan: Medications:  continue current medications Home glucose monitoring: Frequency:   Timing:   Instruction/counseling given: reminded to bring blood glucose meter & log to each visit Educational resources provided: brochure Self management tools provided:   Other plans: Will discuss DM at length during next visit in 2-4 weeks when HbA1c is to be rechecked.

## 2013-12-03 NOTE — Progress Notes (Signed)
INTERNAL MEDICINE TEACHING ATTENDING ADDENDUM - Eryck Negron, MD: I reviewed and discussed at the time of visit with the resident Dr. Jones, the patient's medical history, physical examination, diagnosis and results of pertinent tests and treatment and I agree with the patient's care as documented.  

## 2013-12-03 NOTE — Progress Notes (Signed)
Subjective:   [name] female   DOB: Feb 18, 1971 43 y.o.   MRN: [id number]  HPI: [name] is a 43 y.o. 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 Patient claims the pain has been present for about 3-4 days, is dull in nature but sometimes intermittently sharp, located in the right upper quadrant of the right breast. She says she has had similar pain before but not quite to this extent in the past. She also says she feels a small "lump" that she did not notice before. She had a mammogram in 03/2013 which was BIRADS 1 (negative). She denies any associated fever, chills, nausea, vomiting, severe breast tenderness, or nipple discharge. She does admit that this pain has been related to her menstrual cycle and typically presents around her period. The patient is not breast feeding currently. No further complaints today.  She is scheduled to follow up w/ a rheumatologist in the next ?week for further evaluation of her hip and leg pain.    Past Medical History  Diagnosis Date  . Peripheral neuropathy   . Fibromyalgia   . Episodic recurrent vertigo   . Vitamin D deficiency     history of  . Hyperlipidemia   . Diabetes mellitus   . Chronic rhinitis   . Depression   . Asthma   . Tinnitus     history of, Evaluated by ENT> [place]  . Rheumatoid arthritis(714.0)     seen by Rheumatology in [facility], Rheumatoid Factor pos, Anti CCP pos in 2005 -? early presentation of RF  . Obesity   . History of hip fracture     hx childhood hip fracture   Current Outpatient Prescriptions  Medication Sig Dispense Refill  . albuterol (PROVENTIL HFA;VENTOLIN HFA) 108 (90 BASE) MCG/ACT inhaler Inhale 2 puffs into the lungs every 6 (six) hours as needed for wheezing or shortness of breath.  1 Inhaler  5  . gabapentin  (NEURONTIN) 100 MG capsule Take 1 capsule (100 mg total) by mouth daily. If pain doesn't improve, take 1 more tablet. Do NOT take more than 3 tablets.  30 capsule  2  . glucose blood test strip Use as instructed  100 each  12  . HYDROcodone-acetaminophen (NORCO) 5-325 MG per tablet Take 1-2 tablets by mouth 2 (two) times daily as needed for moderate pain or severe pain.  30 tablet  0  . ibuprofen ([employer] IBUPROFEN) 200 MG tablet Take 2 tablets (400 mg total) by mouth every 6 (six) hours as needed.  30 tablet  0  . insulin glargine (LANTUS) 100 unit/mL SOPN Inject 0.45 mLs (45 Units total) into the skin daily.  15 mL  3  . pantoprazole (PROTONIX) 20 MG tablet Take 1 tablet (20 mg total) by mouth daily.  30 tablet  1   No current facility-administered medications for this visit.    Review of Systems: General: Positive for fatigue. Denies fever, chills, diaphoresis, appetite change. Positive for right breast pain.  Respiratory: Denies SOB, DOE, cough, and wheezing.   Cardiovascular: Denies chest pain and palpitations.  Gastrointestinal: Denies nausea, vomiting, abdominal pain, and diarrhea.  Genitourinary: Denies dysuria, increased frequency, and flank pain. Endocrine: Denies hot or cold intolerance, polyuria, and polydipsia. Musculoskeletal: Positive for bilateral hip pain. Denies myalgias, back pain, joint swelling, and gait problem.  Skin: Denies pallor, rash and wounds.  Neurological: Denies dizziness, seizures, syncope, weakness, lightheadedness, numbness and headaches.  Psychiatric/Behavioral: Denies mood changes, and sleep disturbances.  Objective:   Physical Exam: Filed Vitals:   12/02/13 1618  BP: 124/76  Pulse: 87  Temp: 97.9 F (36.6 C)  TempSrc: Oral  Height: 5\' 4"  (1.626 m)  Weight: 157 lb 4.8 oz (71.351 kg)  SpO2: 100%   Physical Exam: General: Alert, cooperative, NAD. HEENT: PERRL, EOMI. Moist mucus membranes Neck: Full range of motion without pain, supple, no  lymphadenopathy or carotid bruits Breast exam: Normal breast tissue bilaterally. No significant changes in density felt on exam. Mild tenderness to palpation in upper lateral quadrant of the right breast. No obvious erythema, skin changes, or discharge. No nipple discharge bilaterally.  Lungs: Clear to ascultation bilaterally, normal work of respiration, no wheezes, rales, rhonchi Heart: RRR, no murmurs, gallops, or rubs Abdomen: Soft, non-tender, non-distended, BS + Extremities: No cyanosis, clubbing, or edema Neurologic: Alert & oriented X3, cranial nerves II-XII intact, strength grossly intact, sensation intact to light touch   Assessment & Plan:   Please see problem based assessment and plan.

## 2013-12-03 NOTE — Assessment & Plan Note (Signed)
Patient w/ right breast pain, cyclic in nature, surrounding her period. Has had this before. On exam, no obvious inflammation, erythema, or discharge. Only mild tenderness on exam. No significant change in breast density on examination. Does not appear to be non-lactational mastitis. Most likely Adriana Burke as patient has had this before, cyclic pain. Recent mammo from 03/2013 BIRADS 1 (negative). -Encouraged patient to wear supportive bra, heat/ice compresses, NSAIDS and tylenol for pain relief.  -Repeat mammogram in 03/2014.  -Patient to return if significant changes in exam, worsening pain, or systemic symptoms.

## 2013-12-10 ENCOUNTER — Encounter: Payer: Self-pay | Admitting: *Deleted

## 2013-12-22 ENCOUNTER — Encounter: Payer: Self-pay | Admitting: Internal Medicine

## 2014-01-05 ENCOUNTER — Encounter: Payer: Self-pay | Admitting: Internal Medicine

## 2014-02-19 ENCOUNTER — Ambulatory Visit: Payer: Medicaid Other | Admitting: Internal Medicine

## 2014-02-24 ENCOUNTER — Encounter: Payer: Self-pay | Admitting: Internal Medicine

## 2014-02-24 ENCOUNTER — Ambulatory Visit: Payer: Medicaid Other | Admitting: *Deleted

## 2014-02-24 ENCOUNTER — Ambulatory Visit (INDEPENDENT_AMBULATORY_CARE_PROVIDER_SITE_OTHER): Payer: Medicaid Other | Admitting: Internal Medicine

## 2014-02-24 VITALS — BP 123/73 | HR 85 | Temp 97.9°F | Ht 64.0 in | Wt 153.3 lb

## 2014-02-24 DIAGNOSIS — E118 Type 2 diabetes mellitus with unspecified complications: Secondary | ICD-10-CM

## 2014-02-24 DIAGNOSIS — Z794 Long term (current) use of insulin: Secondary | ICD-10-CM

## 2014-02-24 DIAGNOSIS — E1165 Type 2 diabetes mellitus with hyperglycemia: Secondary | ICD-10-CM

## 2014-02-24 DIAGNOSIS — Z23 Encounter for immunization: Secondary | ICD-10-CM

## 2014-02-24 DIAGNOSIS — IMO0002 Reserved for concepts with insufficient information to code with codable children: Secondary | ICD-10-CM

## 2014-02-24 DIAGNOSIS — Z0000 Encounter for general adult medical examination without abnormal findings: Secondary | ICD-10-CM

## 2014-02-24 DIAGNOSIS — Z9114 Patient's other noncompliance with medication regimen: Secondary | ICD-10-CM

## 2014-02-24 DIAGNOSIS — G629 Polyneuropathy, unspecified: Secondary | ICD-10-CM

## 2014-02-24 DIAGNOSIS — M79672 Pain in left foot: Secondary | ICD-10-CM | POA: Insufficient documentation

## 2014-02-24 LAB — POCT GLYCOSYLATED HEMOGLOBIN (HGB A1C): Hemoglobin A1C: >14

## 2014-02-24 LAB — GLUCOSE, CAPILLARY: Glucose-Capillary: 321 mg/dL — ABNORMAL HIGH (ref 70–99)

## 2014-02-24 MED ORDER — DICLOFENAC SODIUM 1 % TD GEL: 2.0000 g | Freq: Four times a day (QID) | TRANSDERMAL | Status: DC

## 2014-02-24 MED ORDER — GABAPENTIN 300 MG PO CAPS: 300.0000 mg | ORAL_CAPSULE | Freq: Every day | ORAL | Status: DC

## 2014-02-24 NOTE — Assessment & Plan Note (Signed)
Patient complaining of some mild suprapubic discomfort but declining pap smear and pelvic exam at this time.  -Patient to return in 03/2014 for pap.  -Flu shot given today.

## 2014-02-24 NOTE — Progress Notes (Signed)
Subjective:   Patient ID: DEDE DOBESH female   DOB: [birth year] 43 y.o.   MRN: [id number]  HPI: [name] is a 43 y.o. female w/ PMHx of uncontrolled DM type II w/ peripheral neuropathy, HLD, chronic low back pain, questionable h/o rheumatologic disease, and significant h/o non-compliance, presents to the clinic today for an acute visit for left foot pain. The patient says her pain has been significant for quite some time and sometimes interferes with her ability to ambulate. She claims to have some swelling in the area where she has the pain, on the dorsum of the ankle and into the foot. She has had this for quite some time and has seen physicians in the past for injections and she claims she has had this area aspirated by Dr. Stevie Kern years ago. Per chart review, (note from 02/21/2011) this was suspected to be a ganglion cyst vs arthritis. She denies any recent h/o trauma, no previous h/o gout. Denies recent fever or chills, nausea, vomiting, or other joint involvement (aside from lower back). Has seen Dr. Trudie Reed recently who apparently performed an XR which was insignificant for any acute abnormality.  Lastly, the patient continues to be non-complaint w/ her Lantus insulin. She has previously not tolerated Metformin (causes nausea and vomiting) and does not check her blood sugars. HbA1c continues to be >14 despite frequent conversations about the consequences of uncontrolled blood sugars, issues at home w/ compliance, lack of motivation, pain at injection site, etc. She admits to some blurry vision, but otherwise denies symptoms of hypo- or hyperglycemia. CBG's typically in the 300's.   Past Medical History  Diagnosis Date  . Peripheral neuropathy   . Fibromyalgia   . Episodic recurrent vertigo   . Vitamin D deficiency     history of  . Hyperlipidemia   . Diabetes mellitus   . Chronic rhinitis   . Depression   . Asthma   . Tinnitus     history of, Evaluated by ENT> [place]  .  Rheumatoid arthritis(714.0)     seen by Rheumatology in [facility], Rheumatoid Factor pos, Anti CCP pos in 2005 -? early presentation of RF  . Obesity   . History of hip fracture     hx childhood hip fracture   Current Outpatient Prescriptions  Medication Sig Dispense Refill  . albuterol (PROVENTIL HFA;VENTOLIN HFA) 108 (90 BASE) MCG/ACT inhaler Inhale 2 puffs into the lungs every 6 (six) hours as needed for wheezing or shortness of breath. 1 Inhaler 5  . diclofenac sodium (VOLTAREN) 1 % GEL Apply 2 g topically 4 (four) times daily. 100 g 1  . gabapentin (NEURONTIN) 300 MG capsule Take 1 capsule (300 mg total) by mouth at bedtime. If pain doesn't improve, take 1 more tablet. Do NOT take more than 3 tablets. 30 capsule 2  . glucose blood test strip Use as instructed 100 each 12  . ibuprofen ([employer] IBUPROFEN) 200 MG tablet Take 2 tablets (400 mg total) by mouth every 6 (six) hours as needed. 30 tablet 0  . insulin glargine (LANTUS) 100 unit/mL SOPN Inject 0.45 mLs (45 Units total) into the skin daily. 15 mL 3  . pantoprazole (PROTONIX) 20 MG tablet Take 1 tablet (20 mg total) by mouth daily. 30 tablet 1   No current facility-administered medications for this visit.    Review of Systems: General: Denies fever, chills, diaphoresis, appetite change and fatigue.  Respiratory: Denies SOB, DOE, cough, and wheezing.   Cardiovascular: Denies  chest pain and palpitations.  Gastrointestinal: Denies nausea, vomiting, abdominal pain, and diarrhea.  Genitourinary: Denies dysuria, increased frequency, and flank pain. Endocrine: Denies hot or cold intolerance, polyuria, and polydipsia. Musculoskeletal: Positive for low back pain, left ankle/foot pain. Denies myalgias, joint swelling, and gait problem.  Skin: Denies pallor, rash and wounds.  Neurological: Denies dizziness, seizures, syncope, weakness, lightheadedness, numbness and headaches.  Psychiatric/Behavioral: Denies mood changes, and sleep  disturbances.   Objective:   Physical Exam: Filed Vitals:   02/24/14 1406  BP: 123/73  Pulse: 85  Temp: 97.9 F (36.6 C)  TempSrc: Oral  Height: 5\' 4"  (1.626 m)  Weight: 153 lb 4.8 oz (69.536 kg)  SpO2: 100%    General: AA female, alert, cooperative, NAD. HEENT: PERRL, EOMI. Moist mucus membranes. Wears color contacts.  Neck: Full range of motion without pain, supple, no lymphadenopathy or carotid bruits Lungs: Clear to ascultation bilaterally, normal work of respiration, no wheezes, rales, rhonchi Heart: RRR, no murmurs, gallops, or rubs Abdomen: Soft, non-tender, non-distended, BS +. No obvious signs of infection at site of insulin injection. No abdominal ecchymosis or superficial tenderness.  Extremities: No cyanosis, clubbing, or edema. Left foot w/ point tenderness over the dorsum of the ankle and foot. Possible mild swelling, however, looks similar to the right. No wounds, ulcers, or lesions. No erythema.   Neurologic: Alert & oriented X3, cranial nerves II-XII intact, strength grossly intact, sensation intact to light touch   Assessment & Plan:   Please see problem based assessment and plan.

## 2014-02-24 NOTE — Patient Instructions (Signed)
General Instructions:  1. Please schedule a follow up appointment for 2 weeks (or sooner) for Pap smear.  Then schedule follow up in 3 months for HbA1c check.   2. Please take all medications as prescribed.   PLEASE TAKE YOUR LANTUS EVERY DAY!!!  3. If you have worsening of your symptoms or new symptoms arise, please call the clinic [phone]), or go to the ER immediately if symptoms are severe.    Please bring your medicines with you each time you come to clinic.  Medicines may include prescription medications, over-the-counter medications, herbal remedies, eye drops, vitamins, or other pills.   Progress Toward Treatment Goals:  Treatment Goal 02/24/2014  Hemoglobin A1C unchanged  Prevent falls at goal    Self Care Goals & Plans:  Self Care Goal 02/24/2014  Manage my medications take my medicines as prescribed; bring my medications to every visit; refill my medications on time  Monitor my health keep track of my blood glucose; bring my glucose meter and log to each visit  Eat healthy foods drink diet soda or water instead of juice or soda; eat more vegetables; eat foods that are low in salt; eat baked foods instead of fried foods; eat fruit for snacks and desserts  Be physically active -  Prevent falls -  Meeting treatment goals maintain the current self-care plan    Home Blood Glucose Monitoring 02/24/2014  Check my blood sugar no home glucose monitoring  When to check my blood sugar N/A     Care Management & Community Referrals:  Referral 02/24/2014  Referrals made for care management support -  Referrals made to community resources none

## 2014-02-24 NOTE — Assessment & Plan Note (Addendum)
Dorsum of left foot just distal to the ankle with point tenderness. Very mild swelling, but appears similar to the right foot. Has had h/o pain here in the past, apparently had this aspirated by Dr. Stevie Kern several years ago. Thought to be possibly a ganglion cyst? No h/o gout. Recently seen by Dr. Trudie Reed who performed XR of the left foot and according to the patient, no acute abnormalities. Suspect this pain is 2/2 arthritic changes vs neuropathy (also some pain in the right foot) at this time. Do not suspect gout given absence of SEVERE pain and erythema. Advised continued follow up w/ Dr. Trudie Reed.  -Given Rx of Voltaren gel -Increase Neurontin to 300 mg qhs for now; has been very non-compliant w/ 100 mg daily. Encouraged patient to take this. -Use heating pad at times of rest -can consider further imaging vs aspiration if continued pain and swelling during next clinic visit.

## 2014-02-24 NOTE — Assessment & Plan Note (Signed)
Lab Results  Component Value Date   HGBA1C >14.0 02/24/2014   HGBA1C >14.0 09/16/2013   HGBA1C >14.0 01/02/2013     Assessment: Diabetes control: poor control (HgbA1C >9%) Progress toward A1C goal:  unchanged Comments: Patient continues to be non-compliant w/ Lantus Insulin. Refuses to take metformin because it has made her nauseous in the past. Says she only uses it 2 times weekly. Refuses to check CBG's. Claims she has had pain at her injection site. Discussed the importance of changing needles frequently, changing injection sites, and cleaning site prior to injection. Uses insulin pen with small needle. Discussed consequences of poorly controlled DM including requiring dialysis, heart disease, stroke, etc. She understood. Claims she will start using her insulin every day.   Plan: Medications:  continue current medications; Lantus 45 units qhs. Refuses diabetes educator visit.  Home glucose monitoring: Frequency: no home glucose monitoring Timing: N/A Instruction/counseling given: reminded to bring medications to each visit Educational resources provided: brochure Self management tools provided:   Other plans: Referral for ophthalmology. Repeat HbA1c in 3 months.

## 2014-02-25 NOTE — Progress Notes (Signed)
Internal Medicine Clinic Attending  Case discussed with Dr. Jones soon after the resident saw the patient.  We reviewed the resident's history and exam and pertinent patient test results.  I agree with the assessment, diagnosis, and plan of care documented in the resident's note. 

## 2014-03-17 ENCOUNTER — Encounter: Payer: Medicaid Other | Admitting: Internal Medicine

## 2014-04-18 ENCOUNTER — Encounter (HOSPITAL_COMMUNITY): Payer: Self-pay | Admitting: Emergency Medicine

## 2014-04-18 ENCOUNTER — Emergency Department (INDEPENDENT_AMBULATORY_CARE_PROVIDER_SITE_OTHER): Payer: Medicaid Other

## 2014-04-18 ENCOUNTER — Emergency Department (HOSPITAL_COMMUNITY)
Admission: EM | Admit: 2014-04-18 | Discharge: 2014-04-18 | Disposition: A | Discharge status: Home / Self Care | Payer: Medicaid Other | Source: Home / Self Care | Attending: Family Medicine | Admitting: Family Medicine

## 2014-04-18 DIAGNOSIS — S46012A Strain of muscle(s) and tendon(s) of the rotator cuff of left shoulder, initial encounter: Secondary | ICD-10-CM

## 2014-04-18 MED ORDER — HYDROCODONE-ACETAMINOPHEN 5-325 MG PO TABS: 1.0000 | ORAL_TABLET | Freq: Four times a day (QID) | ORAL | Status: DC | PRN

## 2014-04-18 MED ORDER — IBUPROFEN 600 MG PO TABS: 600.0000 mg | ORAL_TABLET | Freq: Three times a day (TID) | ORAL | Status: DC | PRN

## 2014-04-18 NOTE — ED Provider Notes (Signed)
Raina L Saur is a 44 y.o. female who presents to Urgent Care today for left shoulder pain. Patient cares for her completely disabled adult son. She was lifting him 2 days ago when she felt a pop in her shoulder. Since then she's had significant pain in the trapezius area and on the anterior to lateral aspect of her upper arm. The pain is worse with overhand motion. No radiating pain weakness or numbness. She has tried ibuprofen which helps a little. She feels well otherwise.   Past Medical History  Diagnosis Date  . Peripheral neuropathy   . Fibromyalgia   . Episodic recurrent vertigo   . Vitamin D deficiency     history of  . Hyperlipidemia   . Diabetes mellitus   . Chronic rhinitis   . Depression   . Asthma   . Tinnitus     history of, Evaluated by ENT>   . Rheumatoid arthritis(714.0)     seen by Rheumatology in [facility], Rheumatoid Factor pos, Anti CCP pos in 2005 -? early presentation of RF  . Obesity   . History of hip fracture     hx childhood hip fracture   Past Surgical History  Procedure Laterality Date  . Cesarean section     History  Substance Use Topics  . Smoking status: Never Smoker   . Smokeless tobacco: Not on file  . Alcohol Use: 0.0 oz/week    0 Standard drinks or equivalent per week     Comment: ccasionally   ROS as above Medications: No current facility-administered medications for this encounter.   Current Outpatient Prescriptions  Medication Sig Dispense Refill  . albuterol (PROVENTIL HFA;VENTOLIN HFA) 108 (90 BASE) MCG/ACT inhaler Inhale 2 puffs into the lungs every 6 (six) hours as needed for wheezing or shortness of breath. 1 Inhaler 5  . insulin glargine (LANTUS) 100 unit/mL SOPN Inject 0.45 mLs (45 Units total) into the skin daily. 15 mL 3  . diclofenac sodium (VOLTAREN) 1 % GEL Apply 2 g topically 4 (four) times daily. 100 g 1  . gabapentin (NEURONTIN) 300 MG capsule Take 1 capsule (300 mg total) by mouth at bedtime. If pain doesn't  improve, take 1 more tablet. Do NOT take more than 3 tablets. 30 capsule 2  . glucose blood test strip Use as instructed 100 each 12  . HYDROcodone-acetaminophen (NORCO/VICODIN) 5-325 MG per tablet Take 1 tablet by mouth every 6 (six) hours as needed. 15 tablet 0  . ibuprofen (ADVIL,MOTRIN) 600 MG tablet Take 1 tablet (600 mg total) by mouth every 8 (eight) hours as needed. 60 tablet 0  . pantoprazole (PROTONIX) 20 MG tablet Take 1 tablet (20 mg total) by mouth daily. 30 tablet 1   Allergies  Allergen Reactions  . Metformin And Related     N/v      Exam:  BP 129/84 mmHg  Pulse 85  Temp(Src) 98.2 F (36.8 C) (Oral)  Resp 16  SpO2 100%  LMP 03/19/2014 Gen: Well NAD Neck: Nontender to spinal midline normal neck range of motion Tender palpation left trapezius.  Shoulder range of motion with normal external and internal rotation Abduction limited with active and passively to 110 due to pain Positive impingement testing. Strength is 4+/5 on the left and 5/5 on the right Negative Yergason's test Upper extremity pulses and grip strength and sensation are intact bilaterally Right shoulder nontender normal motion negative impingement normal strength  No results found for this or any previous visit (from the  past 24 hour(s)). Dg Shoulder Left  04/18/2014   CLINICAL DATA:  44 year old female with left shoulder pain radiating down the left arm since falling 3 days ago. Limited range of motion secondary to pain.  EXAM: LEFT SHOULDER - 2+ VIEW  COMPARISON:  Prior chest x-ray 02/12/2012  FINDINGS: There is no evidence of fracture or dislocation. There is no evidence of arthropathy or other focal bone abnormality. Soft tissues are unremarkable.  IMPRESSION: Negative.   Electronically Signed   By: Jacqulynn Cadet M.D.   On: 04/18/2014 11:33    Assessment and Plan: 44 y.o. female with left shoulder injury suspect rotator cuff strain. Doubtful for full thickness tear. Treat with ibuprofen,  norco, home exercise program and referral to orthopedics.  Discussed warning signs or symptoms. Please see discharge instructions. Patient expresses understanding.     Gregor Hams, MD 04/18/14 [phone]

## 2014-04-18 NOTE — Discharge Instructions (Signed)
Thank you for coming in today. Follow up with orthopedics.  Do not drive after taking hydrocodone. Return as needed.   Rotator Cuff Tendinitis  Rotator cuff tendinitis is inflammation of the tough, cord-like bands that connect muscle to bone (tendons) in your rotator cuff. Your rotator cuff is the collection of all the muscles and tendons that connect your arm to your shoulder. Your rotator cuff holds the head of your upper arm bone (humerus) in the cup (fossa) of your shoulder blade (scapula). CAUSES Rotator cuff tendinitis is usually caused by overusing the joint involved.  SIGNS AND SYMPTOMS  Deep ache in the shoulder also felt on the outside upper arm over the shoulder muscle.  Point tenderness over the area that is injured.  Pain comes on gradually and becomes worse with lifting the arm to the side (abduction) or turning it inward (internal rotation).  May lead to a chronic tear: When a rotator cuff tendon becomes inflamed, it runs the risk of losing its blood supply, causing some tendon fibers to die. This increases the risk that the tendon can fray and partially or completely tear. DIAGNOSIS Rotator cuff tendinitis is diagnosed by taking a medical history, performing a physical exam, and reviewing results of imaging exams. The medical history is useful to help determine the type of rotator cuff injury. The physical exam will include looking at the injured shoulder, feeling the injured area, and watching you do range-of-motion exercises. X-ray exams are typically done to rule out other causes of shoulder pain, such as fractures. MRI is the imaging exam usually used for significant shoulder injuries. Sometimes a dye study called CT arthrogram is done, but it is not as widely used as MRI. In some institutions, special ultrasound tests may also be used to aid in the diagnosis. TREATMENT  Less Severe Cases  Use of a sling to rest the shoulder for a short period of time. Prolonged use of the  sling can cause stiffness, weakness, and loss of motion of the shoulder joint.  Anti-inflammatory medicines, such as ibuprofen or naproxen sodium, may be prescribed. More Severe Cases  Physical therapy.  Use of steroid injections into the shoulder joint.  Surgery. HOME CARE INSTRUCTIONS   Use a sling or splint until the pain decreases. Prolonged use of the sling can cause stiffness, weakness, and loss of motion of the shoulder joint.  Apply ice to the injured area:  Put ice in a plastic bag.  Place a towel between your skin and the bag.  Leave the ice on for 20 minutes, 2-3 times a day.  Try to avoid use other than gentle range of motion while your shoulder is painful. Use the shoulder and exercise only as directed by your health care provider. Stop exercises or range of motion if pain or discomfort increases, unless directed otherwise by your health care provider.  Only take over-the-counter or prescription medicines for pain, discomfort, or fever as directed by your health care provider.  If you were given a shoulder sling and straps (immobilizer), do not remove it except as directed, or until you see a health care provider for a follow-up exam. If you need to remove it, move your arm as little as possible or as directed.  You may want to sleep on several pillows at night to lessen swelling and pain. SEEK IMMEDIATE MEDICAL CARE IF:   Your shoulder pain increases or new pain develops in your arm, hand, or fingers and is not relieved with medicines.  You  have new, unexplained symptoms, especially increased numbness in the hands or loss of strength.  You develop any worsening of the problems that brought you in for care.  Your arm, hand, or fingers are numb or tingling.  Your arm, hand, or fingers are swollen, painful, or turn white or blue. MAKE SURE YOU:  Understand these instructions.  Will watch your condition.  Will get help right away if you are not doing well or  get worse. Document Released: 05/13/2003 Document Revised: 12/11/2012 Document Reviewed: 10/02/2012 [facility] Patient Information 2015 [place], [place]. This information is not intended to replace advice given to you by your health care provider. Make sure you discuss any questions you have with your health care provider.

## 2014-04-18 NOTE — ED Notes (Signed)
Reports lifting disabled son two days ago and her a pop in the left shoulder followed by severe pain. Pain radiates down from left side of neck into shoulder.  Pt is unable to raise arm above head.

## 2014-04-27 ENCOUNTER — Other Ambulatory Visit: Payer: Self-pay

## 2014-05-25 ENCOUNTER — Emergency Department (HOSPITAL_COMMUNITY): Payer: Medicaid Other

## 2014-05-25 ENCOUNTER — Emergency Department (HOSPITAL_COMMUNITY)
Admission: EM | Admit: 2014-05-25 | Discharge: 2014-05-26 | Disposition: A | Discharge status: Home / Self Care | Payer: Medicaid Other | Attending: Emergency Medicine | Admitting: Emergency Medicine

## 2014-05-25 ENCOUNTER — Encounter (HOSPITAL_COMMUNITY): Payer: Self-pay | Admitting: *Deleted

## 2014-05-25 DIAGNOSIS — Z794 Long term (current) use of insulin: Secondary | ICD-10-CM | POA: Insufficient documentation

## 2014-05-25 DIAGNOSIS — M797 Fibromyalgia: Secondary | ICD-10-CM | POA: Diagnosis not present

## 2014-05-25 DIAGNOSIS — Z3202 Encounter for pregnancy test, result negative: Secondary | ICD-10-CM | POA: Insufficient documentation

## 2014-05-25 DIAGNOSIS — J45901 Unspecified asthma with (acute) exacerbation: Secondary | ICD-10-CM | POA: Insufficient documentation

## 2014-05-25 DIAGNOSIS — Z79899 Other long term (current) drug therapy: Secondary | ICD-10-CM | POA: Insufficient documentation

## 2014-05-25 DIAGNOSIS — Z8781 Personal history of (healed) traumatic fracture: Secondary | ICD-10-CM | POA: Diagnosis not present

## 2014-05-25 DIAGNOSIS — G5621 Lesion of ulnar nerve, right upper limb: Secondary | ICD-10-CM | POA: Insufficient documentation

## 2014-05-25 DIAGNOSIS — R0789 Other chest pain: Secondary | ICD-10-CM | POA: Insufficient documentation

## 2014-05-25 DIAGNOSIS — G629 Polyneuropathy, unspecified: Secondary | ICD-10-CM | POA: Diagnosis not present

## 2014-05-25 DIAGNOSIS — E119 Type 2 diabetes mellitus without complications: Secondary | ICD-10-CM | POA: Diagnosis not present

## 2014-05-25 DIAGNOSIS — E669 Obesity, unspecified: Secondary | ICD-10-CM | POA: Diagnosis not present

## 2014-05-25 DIAGNOSIS — M069 Rheumatoid arthritis, unspecified: Secondary | ICD-10-CM | POA: Insufficient documentation

## 2014-05-25 DIAGNOSIS — R079 Chest pain, unspecified: Secondary | ICD-10-CM | POA: Diagnosis present

## 2014-05-25 LAB — BASIC METABOLIC PANEL
ANION GAP: 7 (ref 5–15)
BUN: 8 mg/dL (ref 6–23)
CALCIUM: 9.2 mg/dL (ref 8.4–10.5)
CO2: 27 mmol/L (ref 19–32)
Chloride: 98 mmol/L (ref 96–112)
Creatinine, Ser: 0.81 mg/dL (ref 0.50–1.10)
GFR, EST NON AFRICAN AMERICAN: 87 mL/min — AB (ref >90)
Glucose, Bld: 322 mg/dL — ABNORMAL HIGH (ref 70–99)
Potassium: 3.4 mmol/L — ABNORMAL LOW (ref 3.5–5.1)
SODIUM: 132 mmol/L — AB (ref 135–145)

## 2014-05-25 LAB — CBC
HCT: 38.2 % (ref 36.0–46.0)
HEMOGLOBIN: 12.8 g/dL (ref 12.0–15.0)
MCH: 23.3 pg — AB (ref 26.0–34.0)
MCHC: 33.5 g/dL (ref 30.0–36.0)
MCV: 69.6 fL — ABNORMAL LOW (ref 78.0–100.0)
PLATELETS: 207 10*3/uL (ref 150–400)
RBC: 5.49 MIL/uL — AB (ref 3.87–5.11)
RDW: 14.7 % (ref 11.5–15.5)
WBC: 5.6 10*3/uL (ref 4.0–10.5)

## 2014-05-25 LAB — I-STAT TROPONIN, ED: Troponin i, poc: 0 ng/mL (ref 0.00–0.08)

## 2014-05-25 LAB — PREGNANCY, URINE: Preg Test, Ur: NEGATIVE

## 2014-05-25 MED ORDER — ALBUTEROL SULFATE (2.5 MG/3ML) 0.083% IN NEBU
5.0000 mg | INHALATION_SOLUTION | Freq: Once | RESPIRATORY_TRACT | Status: AC
  Administered 2014-05-25: 5 mg via RESPIRATORY_TRACT
  Filled 2014-05-25: qty 6

## 2014-05-25 MED ORDER — PREDNISONE 20 MG PO TABS
60.0000 mg | ORAL_TABLET | Freq: Once | ORAL | Status: AC
  Administered 2014-05-25: 60 mg via ORAL
  Filled 2014-05-25: qty 3

## 2014-05-25 MED ORDER — LORATADINE 10 MG PO TABS
10.0000 mg | ORAL_TABLET | Freq: Once | ORAL | Status: AC
  Administered 2014-05-26: 10 mg via ORAL
  Filled 2014-05-25: qty 1

## 2014-05-25 NOTE — ED Provider Notes (Signed)
CSN: [id number]     Arrival date & time 05/25/14  2042 History  This chart was scribed for Julianne Rice, MD by Molli Posey, ED Scribe. This patient was seen in room D31C/D31C and the patient's care was started 11:28 PM.   Chief Complaint  Patient presents with  . Chest Pain  . Cough  . Shortness of Breath   The history is provided by the patient. No language interpreter was used.   HPI Comments: Adriana Burke is a 44 y.o. female with a history of DM and asthma who presents to the Emergency Department complaining of intermittent central chest pain that radiates to her back for the last week. She describes her pain as sharp and tight. Worse with deep inspiration Pt reports associated unproductive cough and SOB which she attributes to an asthma flare up. She states that seasonal changes has set off her asthma in the past. Pt reports she has been using her albuterol rescue inhaler which has provided her some relief. No recent extended travel. No lower extremity swelling or pain.  Patient also complains of intermittent right hand numbness. States this began again this morning. Over the ulnar edge of her right hand. She has no weakness. States she was encouraged to see a hand surgeon in the past but has yet to do that.  Past Medical History  Diagnosis Date  . Peripheral neuropathy   . Fibromyalgia   . Episodic recurrent vertigo   . Vitamin D deficiency     history of  . Hyperlipidemia   . Diabetes mellitus   . Chronic rhinitis   . Depression   . Asthma   . Tinnitus     history of, Evaluated by ENT> [place]  . Rheumatoid arthritis(714.0)     seen by Rheumatology in [facility], Rheumatoid Factor pos, Anti CCP pos in 2005 -? early presentation of RF  . Obesity   . History of hip fracture     hx childhood hip fracture   Past Surgical History  Procedure Laterality Date  . Cesarean section     Family History  Problem Relation Age of Onset  . Hypertension Mother   . Diabetes  Maternal Grandmother   . Arthritis Maternal Grandmother   . Kidney disease Maternal Grandmother    History  Substance Use Topics  . Smoking status: Never Smoker   . Smokeless tobacco: Not on file  . Alcohol Use: 0.0 oz/week    0 Standard drinks or equivalent per week     Comment: ccasionally   OB History    Gravida Para Term Preterm AB TAB SAB Ectopic Multiple Living   6 6 6      1 6      Review of Systems  Constitutional: Negative for fever and chills.  HENT: Negative for congestion, facial swelling, sinus pressure, sore throat and trouble swallowing.   Respiratory: Positive for cough, chest tightness, shortness of breath and wheezing.   Cardiovascular: Positive for chest pain. Negative for palpitations and leg swelling.  Gastrointestinal: Negative for nausea, vomiting, abdominal pain and diarrhea.  Musculoskeletal: Negative for back pain, neck pain and neck stiffness.  Skin: Negative for rash and wound.  Neurological: Positive for numbness. Negative for dizziness, light-headedness and headaches.  All other systems reviewed and are negative.   Allergies  Metformin and related  Home Medications   Prior to Admission medications   Medication Sig Start Date End Date Taking? Authorizing Provider  albuterol (PROVENTIL HFA;VENTOLIN HFA) 108 (90 BASE) MCG/ACT  inhaler Inhale 2 puffs into the lungs every 6 (six) hours as needed for wheezing or shortness of breath. 10/14/13  Yes Corky Sox, MD  diclofenac sodium (VOLTAREN) 1 % GEL Apply 2 g topically 4 (four) times daily. Patient taking differently: Apply 2 g topically 4 (four) times daily as needed (pain).  02/24/14  Yes Corky Sox, MD  gabapentin (NEURONTIN) 300 MG capsule Take 1 capsule (300 mg total) by mouth at bedtime. If pain doesn't improve, take 1 more tablet. Do NOT take more than 3 tablets. 02/24/14 02/24/15 Yes Corky Sox, MD  HYDROcodone-acetaminophen (NORCO/VICODIN) 5-325 MG per tablet Take 1 tablet by mouth every 6  (six) hours as needed. Patient taking differently: Take 1 tablet by mouth every 6 (six) hours as needed for moderate pain.  04/18/14  Yes Gregor Hams, MD  ibuprofen (ADVIL,MOTRIN) 600 MG tablet Take 1 tablet (600 mg total) by mouth every 8 (eight) hours as needed. Patient taking differently: Take 600 mg by mouth every 8 (eight) hours as needed for moderate pain.  04/18/14  Yes Gregor Hams, MD  insulin glargine (LANTUS) 100 unit/mL SOPN Inject 0.45 mLs (45 Units total) into the skin daily. 09/24/13  Yes Cresenciano Genre, MD  benzonatate (TESSALON) 100 MG capsule Take 1 capsule (100 mg total) by mouth every 8 (eight) hours. 05/26/14   Julianne Rice, MD  loratadine (CLARITIN) 10 MG tablet Take 1 tablet (10 mg total) by mouth daily. 05/26/14   Julianne Rice, MD  pantoprazole (PROTONIX) 20 MG tablet Take 1 tablet (20 mg total) by mouth daily. Patient not taking: Reported on 05/25/2014 10/14/13 10/14/14  Corky Sox, MD  predniSONE (DELTASONE) 20 MG tablet 3 tabs po day one, then 2 tabs daily x 4 days 05/26/14   Julianne Rice, MD   BP 117/73 mmHg  Pulse 83  Temp(Src) 98.5 F (36.9 C) (Oral)  Resp 13  Ht 5\' 4"  (1.626 m)  Wt 148 lb (67.132 kg)  BMI 25.39 kg/m2  SpO2 100%  LMP 05/25/2014 Physical Exam  Constitutional: She is oriented to person, place, and time. She appears well-developed and well-nourished. No distress.  HENT:  Head: Normocephalic and atraumatic.  Mouth/Throat: Oropharynx is clear and moist.  Bilateral nasal mucosal edema. No sinus tenderness with percussion.  Eyes: EOM are normal. Pupils are equal, round, and reactive to light. Right eye exhibits no discharge. Left eye exhibits no discharge.  Neck: Normal range of motion. Neck supple. No tracheal deviation present.  Cardiovascular: Normal rate and regular rhythm.  Exam reveals no gallop and no friction rub.   No murmur heard. Pulmonary/Chest: Effort normal and breath sounds normal. No respiratory distress. She has no wheezes.  She has no rales. She exhibits no tenderness.  Prolonged expiratory phase  Abdominal: Soft. Bowel sounds are normal. She exhibits no distension and no mass. There is no tenderness. There is no rebound and no guarding.  Musculoskeletal: Normal range of motion. She exhibits no edema or tenderness.  No calf swelling or tenderness.  Neurological: She is alert and oriented to person, place, and time.  Patient with paresthesias over the ulnar distribution of the right hand. No weakness. Good cap refill. Distal pulses intact.  Skin: Skin is warm and dry. No rash noted. No erythema.  Psychiatric: She has a normal mood and affect. Her behavior is normal.  Nursing note and vitals reviewed.   ED Course  Procedures   DIAGNOSTIC STUDIES: Oxygen Saturation is 100% on RA, normal  by my interpretation.    COORDINATION OF CARE: 11:34 PM Discussed treatment plan with pt at bedside and pt agreed to plan.   Labs Review Labs Reviewed  CBC - Abnormal; Notable for the following:    RBC 5.49 (*)    MCV 69.6 (*)    MCH 23.3 (*)    All other components within normal limits  BASIC METABOLIC PANEL - Abnormal; Notable for the following:    Sodium 132 (*)    Potassium 3.4 (*)    Glucose, Bld 322 (*)    GFR calc non Af Amer 87 (*)    All other components within normal limits  PREGNANCY, URINE  I-STAT TROPOININ, ED    Imaging Review Dg Chest 2 View  05/25/2014   CLINICAL DATA:  Cough and chest tightness for 1 week  EXAM: CHEST  2 VIEW  COMPARISON:  02/12/2012  FINDINGS: The heart size and mediastinal contours are within normal limits. Both lungs are clear. The visualized skeletal structures are unremarkable.  IMPRESSION: No active cardiopulmonary disease.   Electronically Signed   By: Andreas Newport M.D.   On: 05/25/2014 23:49     EKG Interpretation None      MDM   Final diagnoses:  Atypical chest pain  Asthma exacerbation  Ulnar neuropathy of right upper extremity    I personally performed  the services described in this documentation, which was scribed in my presence. The recorded information has been reviewed and is accurate.  Patient moving air better after breathing treatment. Placed in a wrist splint. She is advised to follow-up with hand surgeon for possible carpal tunnel syndrome. Patient's chest pain today related to her respiratory symptoms. She currently is chest pain-free. EKG without any acute findings. Normal troponin.     Julianne Rice, MD 05/26/14 [phone]

## 2014-05-25 NOTE — ED Notes (Signed)
Pt c/o intermittent central chest pain radiating to back for a week. Pain is associated with a cough. Pt denies n/v, diaphoresis.

## 2014-05-26 MED ORDER — BENZONATATE 100 MG PO CAPS: 100.0000 mg | ORAL_CAPSULE | Freq: Three times a day (TID) | ORAL | Status: DC

## 2014-05-26 MED ORDER — LORATADINE 10 MG PO TABS: 10.0000 mg | ORAL_TABLET | Freq: Every day | ORAL | Status: DC

## 2014-05-26 MED ORDER — PREDNISONE 20 MG PO TABS: ORAL_TABLET | ORAL | Status: DC

## 2014-05-26 NOTE — ED Notes (Signed)
pT a&ox4, AMBULATORY AT D/C WITH STEADY GAIT, nad

## 2014-05-26 NOTE — Discharge Instructions (Signed)
Asthma Asthma is a recurring condition in which the airways tighten and narrow. Asthma can make it difficult to breathe. It can cause coughing, wheezing, and shortness of breath. Asthma episodes, also called asthma attacks, range from minor to life-threatening. Asthma cannot be cured, but medicines and lifestyle changes can help control it. CAUSES Asthma is believed to be caused by inherited (genetic) and environmental factors, but its exact cause is unknown. Asthma may be triggered by allergens, lung infections, or irritants in the air. Asthma triggers are different for each person. Common triggers include:   Animal dander.  Dust mites.  Cockroaches.  Pollen from trees or grass.  Mold.  Smoke.  Air pollutants such as dust, household cleaners, hair sprays, aerosol sprays, paint fumes, strong chemicals, or strong odors.  Cold air, weather changes, and winds (which increase molds and pollens in the air).  Strong emotional expressions such as crying or laughing hard.  Stress.  Certain medicines (such as aspirin) or types of drugs (such as beta-blockers).  Sulfites in foods and drinks. Foods and drinks that may contain sulfites include dried fruit, potato chips, and sparkling grape juice.  Infections or inflammatory conditions such as the flu, a cold, or an inflammation of the nasal membranes (rhinitis).  Gastroesophageal reflux disease (GERD).  Exercise or strenuous activity. SYMPTOMS Symptoms may occur immediately after asthma is triggered or many hours later. Symptoms include:  Wheezing.  Excessive nighttime or early morning coughing.  Frequent or severe coughing with a common cold.  Chest tightness.  Shortness of breath. DIAGNOSIS  The diagnosis of asthma is made by a review of your medical history and a physical exam. Tests may also be performed. These may include:  Lung function studies. These tests show how much air you breathe in and out.  Allergy  tests.  Imaging tests such as X-rays. TREATMENT  Asthma cannot be cured, but it can usually be controlled. Treatment involves identifying and avoiding your asthma triggers. It also involves medicines. There are 2 classes of medicine used for asthma treatment:   Controller medicines. These prevent asthma symptoms from occurring. They are usually taken every day.  Reliever or rescue medicines. These quickly relieve asthma symptoms. They are used as needed and provide short-term relief. Your health care provider will help you create an asthma action plan. An asthma action plan is a written plan for managing and treating your asthma attacks. It includes a list of your asthma triggers and how they may be avoided. It also includes information on when medicines should be taken and when their dosage should be changed. An action plan may also involve the use of a device called a peak flow meter. A peak flow meter measures how well the lungs are working. It helps you monitor your condition. HOME CARE INSTRUCTIONS   Take medicines only as directed by your health care provider. Speak with your health care provider if you have questions about how or when to take the medicines.  Use a peak flow meter as directed by your health care provider. Record and keep track of readings.  Understand and use the action plan to help minimize or stop an asthma attack without needing to seek medical care.  Control your home environment in the following ways to help prevent asthma attacks:  Do not smoke. Avoid being exposed to secondhand smoke.  Change your heating and air conditioning filter regularly.  Limit your use of fireplaces and wood stoves.  Get rid of pests (such as roaches and  mice) and their droppings.  Throw away plants if you see mold on them.  Clean your floors and dust regularly. Use unscented cleaning products.  Try to have someone else vacuum for you regularly. Stay out of rooms while they are  being vacuumed and for a short while afterward. If you vacuum, use a dust mask from a hardware store, a double-layered or microfilter vacuum cleaner bag, or a vacuum cleaner with a HEPA filter.  Replace carpet with wood, tile, or vinyl flooring. Carpet can trap dander and dust.  Use allergy-proof pillows, mattress covers, and box spring covers.  Wash bed sheets and blankets every week in hot water and dry them in a dryer.  Use blankets that are made of polyester or cotton.  Clean bathrooms and kitchens with bleach. If possible, have someone repaint the walls in these rooms with mold-resistant paint. Keep out of the rooms that are being cleaned and painted.  Wash hands frequently. SEEK MEDICAL CARE IF:   You have wheezing, shortness of breath, or a cough even if taking medicine to prevent attacks.  The colored mucus you cough up (sputum) is thicker than usual.  Your sputum changes from clear or white to yellow, green, gray, or bloody.  You have any problems that may be related to the medicines you are taking (such as a rash, itching, swelling, or trouble breathing).  You are using a reliever medicine more than 2-3 times per week.  Your peak flow is still at 50-79% of your personal best after following your action plan for 1 hour.  You have a fever. SEEK IMMEDIATE MEDICAL CARE IF:   You seem to be getting worse and are unresponsive to treatment during an asthma attack.  You are short of breath even at rest.  You get short of breath when doing very little physical activity.  You have difficulty eating, drinking, or talking due to asthma symptoms.  You develop chest pain.  You develop a fast heartbeat.  You have a bluish color to your lips or fingernails.  You are light-headed, dizzy, or faint.  Your peak flow is less than 50% of your personal best. MAKE SURE YOU:   Understand these instructions.  Will watch your condition.  Will get help right away if you are not  doing well or get worse. Document Released: 02/20/2005 Document Revised: 07/07/2013 Document Reviewed: 09/19/2012 [facility] Patient Information 2015 [place], [place]. This information is not intended to replace advice given to you by your health care provider. Make sure you discuss any questions you have with your health care provider.  Chest Pain (Nonspecific) It is often hard to give a specific diagnosis for the cause of chest pain. There is always a chance that your pain could be related to something serious, such as a heart attack or a blood clot in the lungs. You need to follow up with your health care provider for further evaluation. CAUSES   Heartburn.  Pneumonia or bronchitis.  Anxiety or stress.  Inflammation around your heart (pericarditis) or lung (pleuritis or pleurisy).  A blood clot in the lung.  A collapsed lung (pneumothorax). It can develop suddenly on its own (spontaneous pneumothorax) or from trauma to the chest.  Shingles infection (herpes zoster virus). The chest wall is composed of bones, muscles, and cartilage. Any of these can be the source of the pain.  The bones can be bruised by injury.  The muscles or cartilage can be strained by coughing or overwork.  The cartilage  can be affected by inflammation and become sore (costochondritis). DIAGNOSIS  Lab tests or other studies may be needed to find the cause of your pain. Your health care provider may have you take a test called an ambulatory electrocardiogram (ECG). An ECG records your heartbeat patterns over a 24-hour period. You may also have other tests, such as:  Transthoracic echocardiogram (TTE). During echocardiography, sound waves are used to evaluate how blood flows through your heart.  Transesophageal echocardiogram (TEE).  Cardiac monitoring. This allows your health care provider to monitor your heart rate and rhythm in real time.  Holter monitor. This is a portable device that records your heartbeat  and can help diagnose heart arrhythmias. It allows your health care provider to track your heart activity for several days, if needed.  Stress tests by exercise or by giving medicine that makes the heart beat faster. TREATMENT   Treatment depends on what may be causing your chest pain. Treatment may include:  Acid blockers for heartburn.  Anti-inflammatory medicine.  Pain medicine for inflammatory conditions.  Antibiotics if an infection is present.  You may be advised to change lifestyle habits. This includes stopping smoking and avoiding alcohol, caffeine, and chocolate.  You may be advised to keep your head raised (elevated) when sleeping. This reduces the chance of acid going backward from your stomach into your esophagus. Most of the time, nonspecific chest pain will improve within 2-3 days with rest and mild pain medicine.  HOME CARE INSTRUCTIONS   If antibiotics were prescribed, take them as directed. Finish them even if you start to feel better.  For the next few days, avoid physical activities that bring on chest pain. Continue physical activities as directed.  Do not use any tobacco products, including cigarettes, chewing tobacco, or electronic cigarettes.  Avoid drinking alcohol.  Only take medicine as directed by your health care provider.  Follow your health care provider's suggestions for further testing if your chest pain does not go away.  Keep any follow-up appointments you made. If you do not go to an appointment, you could develop lasting (chronic) problems with pain. If there is any problem keeping an appointment, call to reschedule. SEEK MEDICAL CARE IF:   Your chest pain does not go away, even after treatment.  You have a rash with blisters on your chest.  You have a fever. SEEK IMMEDIATE MEDICAL CARE IF:   You have increased chest pain or pain that spreads to your arm, neck, jaw, back, or abdomen.  You have shortness of breath.  You have an  increasing cough, or you cough up blood.  You have severe back or abdominal pain.  You feel nauseous or vomit.  You have severe weakness.  You faint.  You have chills. This is an emergency. Do not wait to see if the pain will go away. Get medical help at once. Call your local emergency services (911 in [place] Do not drive yourself to the hospital. MAKE SURE YOU:   Understand these instructions.  Will watch your condition.  Will get help right away if you are not doing well or get worse. Document Released: 11/30/2004 Document Revised: 02/25/2013 Document Reviewed: 09/26/2007 [facility] Patient Information 2015 [place], [place]. This information is not intended to replace advice given to you by your health care provider. Make sure you discuss any questions you have with your health care provider.  Peripheral Neuropathy Peripheral neuropathy is a type of nerve damage. It affects nerves that carry signals between the spinal  cord and other parts of the body. These are called peripheral nerves. With peripheral neuropathy, one nerve or a group of nerves may be damaged.  CAUSES  Many things can damage peripheral nerves. For some people with peripheral neuropathy, the cause is unknown. Some causes include:  Diabetes. This is the most common cause of peripheral neuropathy.  Injury to a nerve.  Pressure or stress on a nerve that lasts a long time.  Too little vitamin B. Alcoholism can lead to this.  Infections.  Autoimmune diseases, such as multiple sclerosis and systemic lupus erythematosus.  Inherited nerve diseases.  Some medicines, such as cancer drugs.  Toxic substances, such as lead and mercury.  Too little blood flowing to the legs.  Kidney disease.  Thyroid disease. SIGNS AND SYMPTOMS  Different people have different symptoms. The symptoms you have will depend on which of your nerves is damaged. Common symptoms include:  Loss of feeling (numbness) in the feet and  hands.  Tingling in the feet and hands.  Pain that burns.  Very sensitive skin.  Weakness.  Not being able to move a part of the body (paralysis).  Muscle twitching.  Clumsiness or poor coordination.  Loss of balance.  Not being able to control your bladder.  Feeling dizzy.  Sexual problems. DIAGNOSIS  Peripheral neuropathy is a symptom, not a disease. Finding the cause of peripheral neuropathy can be hard. To figure that out, your health care provider will take a medical history and do a physical exam. A neurological exam will also be done. This involves checking things affected by your brain, spinal cord, and nerves (nervous system). For example, your health care provider will check your reflexes, how you move, and what you can feel.  Other types of tests may also be ordered, such as:  Blood tests.  A test of the fluid in your spinal cord.  Imaging tests, such as CT scans or an MRI.  Electromyography (EMG). This test checks the nerves that control muscles.  Nerve conduction velocity tests. These tests check how fast messages pass through your nerves.  Nerve biopsy. A small piece of nerve is removed. It is then checked under a microscope. TREATMENT   Medicine is often used to treat peripheral neuropathy. Medicines may include:  Pain-relieving medicines. Prescription or over-the-counter medicine may be suggested.  Antiseizure medicine. This may be used for pain.  Antidepressants. These also may help ease pain from neuropathy.  Lidocaine. This is a numbing medicine. You might wear a patch or be given a shot.  Mexiletine. This medicine is typically used to help control irregular heart rhythms.  Surgery. Surgery may be needed to relieve pressure on a nerve or to destroy a nerve that is causing pain.  Physical therapy to help movement.  Assistive devices to help movement. HOME CARE INSTRUCTIONS   Only take over-the-counter or prescription medicines as directed  by your health care provider. Follow the instructions carefully for any given medicines. Do not take any other medicines without first getting approval from your health care provider.  If you have diabetes, work closely with your health care provider to keep your blood sugar under control.  If you have numbness in your feet:  Check every day for signs of injury or infection. Watch for redness, warmth, and swelling.  Wear padded socks and comfortable shoes. These help protect your feet.  Do not do things that put pressure on your damaged nerve.  Do not smoke. Smoking keeps blood from getting to  damaged nerves.  Avoid or limit alcohol. Too much alcohol can cause a lack of B vitamins. These vitamins are needed for healthy nerves.  Develop a good support system. Coping with peripheral neuropathy can be stressful. Talk to a mental health specialist or join a support group if you are struggling.  Follow up with your health care provider as directed. SEEK MEDICAL CARE IF:   You have new signs or symptoms of peripheral neuropathy.  You are struggling emotionally from dealing with peripheral neuropathy.  You have a fever. SEEK IMMEDIATE MEDICAL CARE IF:   You have an injury or infection that is not healing.  You feel very dizzy or begin vomiting.  You have chest pain.  You have trouble breathing. Document Released: 02/10/2002 Document Revised: 11/02/2010 Document Reviewed: 10/28/2012 [facility] Patient Information 2015 [place], [place]. This information is not intended to replace advice given to you by your health care provider. Make sure you discuss any questions you have with your health care provider.

## 2014-06-20 LAB — HM DIABETES EYE EXAM

## 2014-07-06 ENCOUNTER — Encounter: Payer: Self-pay | Admitting: *Deleted

## 2014-07-23 ENCOUNTER — Telehealth: Payer: Self-pay | Admitting: Internal Medicine

## 2014-07-23 NOTE — Telephone Encounter (Signed)
Call to patient to confirm appointment for 07/24/14 at 2:15 lmtcb °

## 2014-07-24 ENCOUNTER — Encounter: Payer: Self-pay | Admitting: Internal Medicine

## 2014-07-24 ENCOUNTER — Ambulatory Visit (INDEPENDENT_AMBULATORY_CARE_PROVIDER_SITE_OTHER): Payer: Medicaid Other | Admitting: Internal Medicine

## 2014-07-24 VITALS — BP 107/63 | HR 89 | Temp 98.5°F | Ht 64.0 in | Wt 158.0 lb

## 2014-07-24 DIAGNOSIS — IMO0002 Reserved for concepts with insufficient information to code with codable children: Secondary | ICD-10-CM

## 2014-07-24 DIAGNOSIS — M25552 Pain in left hip: Secondary | ICD-10-CM

## 2014-07-24 DIAGNOSIS — E1165 Type 2 diabetes mellitus with hyperglycemia: Secondary | ICD-10-CM

## 2014-07-24 DIAGNOSIS — M25551 Pain in right hip: Secondary | ICD-10-CM | POA: Diagnosis not present

## 2014-07-24 DIAGNOSIS — Z794 Long term (current) use of insulin: Secondary | ICD-10-CM | POA: Diagnosis not present

## 2014-07-24 DIAGNOSIS — E118 Type 2 diabetes mellitus with unspecified complications: Principal | ICD-10-CM

## 2014-07-24 DIAGNOSIS — M7062 Trochanteric bursitis, left hip: Secondary | ICD-10-CM

## 2014-07-24 DIAGNOSIS — M7061 Trochanteric bursitis, right hip: Secondary | ICD-10-CM

## 2014-07-24 LAB — POCT GLYCOSYLATED HEMOGLOBIN (HGB A1C): Hemoglobin A1C: 11.5

## 2014-07-24 LAB — GLUCOSE, CAPILLARY: Glucose-Capillary: 333 mg/dL — ABNORMAL HIGH (ref 65–99)

## 2014-07-24 MED ORDER — INSULIN GLARGINE 100 UNITS/ML SOLOSTAR PEN: 45.0000 [IU] | PEN_INJECTOR | Freq: Every day | SUBCUTANEOUS | Status: DC

## 2014-07-24 NOTE — Progress Notes (Signed)
[facility] Subjective:   [name] female   DOB: 1970/06/30 44 y.o.   MRN: [id number]  HPI: Adriana Burke is a 44 y.o. female with a history of uncontrolled DM2 with peripheral neuropathy, hyperlipidemia, chronic low back pain, a questionable history of rheumatologic disease and significant medication non-compliance who presents to clinic for assessment of her chronic, bilateral hip pain.  She actually fractured one of her hips as a child (she cannot remember which), but the current pain is bilateral and chronic. It is worst on the lateral aspect of each hip. She finds the pain to be sharp and aching in quality and she is unable to lie on either side because of this pain. She has tried ibuprofen and gabapentin to treat her pain, but these treatments are failing. Of note, she had bilateral hip xrays in 10/2013 that showed well preserved hip and SI joints with normal osseous mineralization, no fracture, dislocation or bone destruction. She denies trauma (other than as a child) or excessive use. She also denies numbness, tingling or loss of bowel/bladder continence.   Past Medical History  Diagnosis Date  . Peripheral neuropathy   . Fibromyalgia   . Episodic recurrent vertigo   . Vitamin D deficiency     history of  . Hyperlipidemia   . Diabetes mellitus   . Chronic rhinitis   . Depression   . Asthma   . Tinnitus     history of, Evaluated by ENT> [place]  . Rheumatoid arthritis(714.0)     seen by Rheumatology in [facility], Rheumatoid Factor pos, Anti CCP pos in 2005 -? early presentation of RF  . Obesity   . History of hip fracture     hx childhood hip fracture   Current Outpatient Prescriptions  Medication Sig Dispense Refill  . albuterol (PROVENTIL HFA;VENTOLIN HFA) 108 (90 BASE) MCG/ACT inhaler Inhale 2 puffs into the lungs every 6 (six) hours as needed for wheezing or shortness of breath. 1 Inhaler 5  . benzonatate (TESSALON) 100 MG  capsule Take 1 capsule (100 mg total) by mouth every 8 (eight) hours. 21 capsule 0  . diclofenac sodium (VOLTAREN) 1 % GEL Apply 2 g topically 4 (four) times daily. (Patient taking differently: Apply 2 g topically 4 (four) times daily as needed (pain). ) 100 g 1  . gabapentin (NEURONTIN) 300 MG capsule Take 1 capsule (300 mg total) by mouth at bedtime. If pain doesn't improve, take 1 more tablet. Do NOT take more than 3 tablets. 30 capsule 2  . HYDROcodone-acetaminophen (NORCO/VICODIN) 5-325 MG per tablet Take 1 tablet by mouth every 6 (six) hours as needed. (Patient taking differently: Take 1 tablet by mouth every 6 (six) hours as needed for moderate pain. ) 15 tablet 0  . ibuprofen (ADVIL,MOTRIN) 600 MG tablet Take 1 tablet (600 mg total) by mouth every 8 (eight) hours as needed. (Patient taking differently: Take 600 mg by mouth every 8 (eight) hours as needed for moderate pain. ) 60 tablet 0  . insulin glargine (LANTUS) 100 unit/mL SOPN Inject 0.45 mLs (45 Units total) into the skin daily. 15 mL 3  . loratadine (CLARITIN) 10 MG tablet Take 1 tablet (10 mg total) by mouth daily. 30 tablet 0  . pantoprazole (PROTONIX) 20 MG tablet Take 1 tablet (20 mg total) by mouth daily. (Patient not taking: Reported on 05/25/2014) 30 tablet 1  . predniSONE (DELTASONE) 20 MG tablet 3 tabs po day one, then 2 tabs  daily x 4 days 11 tablet 0   No current facility-administered medications for this visit.   Family History  Problem Relation Age of Onset  . Hypertension Mother   . Diabetes Maternal Grandmother   . Arthritis Maternal Grandmother   . Kidney disease Maternal Grandmother    History   Social History  . Marital Status: Legally Separated    Spouse Name: N/A  . Number of Children: N/A  . Years of Education: 11   Occupational History  .     Social History Main Topics  . Smoking status: Never Smoker   . Smokeless tobacco: Not on file  . Alcohol Use: 0.0 oz/week    0 Standard drinks or equivalent  per week     Comment: occasionally  . Drug Use: No  . Sexual Activity: Not on file   Other Topics Concern  . None   Social History Narrative   [phone]   Review of Systems: General: no recent illness Skin: no rashes, no lesions HEENT: no headaches, no blurred vision Cardiac: no chest pain, no palpitations Respiratory: no shortness of breath GI: no changes Urinary: no changes Msk: pain on lateral aspect of both hips, worst when she lies on them Psychiatric: no changes   Objective:  Physical Exam: Filed Vitals:   07/24/14 1451  BP: 107/63  Pulse: 89  Temp: 98.5 F (36.9 C)  TempSrc: Oral  Height: 5\' 4"  (1.626 m)  Weight: 158 lb (71.668 kg)  SpO2: 100%   Appearance: in NAD HEENT: AT/[place], PERRL, EOMi Heart: RRR, normal S1S2 Lungs: CTAB, no wheezes or rales Abdomen: BS+, soft, nontender Musculoskeletal: normal range of motion in upper extremities, pain on passive or active motion of hip, especially when joint is manipulated, pain to lateral palpation Extremities: no edema Neurologic: A&Ox3, sensation intact throughout, otherwise grossly intact Skin: no rashes or lesions   Assessment & Plan:  Case discussed with Dr. Eppie Gibson   Type II diabetes mellitus with manifestations, uncontrolled Lab Results  Component Value Date   HGBA1C 11.5 07/24/2014   HGBA1C >14.0 02/24/2014   HGBA1C >14.0 09/16/2013     Assessment: Diabetes control: 11.5%    Progress toward A1C goal: improved from >14% on last visit    Comments: Patient claims that she has been compliant with her Lantus insulin since her last visit; it appears that she has indeed had better control of her sugars. She has not been checking her CBGs. She has had some success changing injection sites, though she continues to experience some pain at the site during injection. She appears motivated to continue use of Lantus given the improved A1c today.  Plan: Medications:  continue current medications; Lantus 45 units  qhs Home glucose monitoring: Frequency:  none  Instruction/counseling given: reminded to bring medications to each visit Other plans:   - Repeat A1c in 3 months, especially in the context of steroid hip injections with sports medicine that are to start soon      Bilateral hip pain Patient has persistent bilateral hip pain; it is sharp and aching in quality, worsened by lateral palpation and by lying on either side. These characteristics and her exam point toward trochanteric bursitis and the patient would likely benefit from glucocorticoid injections.   - Referral to sports medicine for opinion and consideration of glucocorticoid injections to alleviate pain - Consider PT in the future, once the patient's pain is controlled (and thus, she is better able to participate). Discussed this plan for PT with patient,  who is amenable - Discussed importance of maintaining improvement in glucose control in the context of these steroid injections     Medications Ordered Meds ordered this encounter  Medications  . insulin glargine (LANTUS) 100 unit/mL SOPN    Sig: Inject 0.45 mLs (45 Units total) into the skin daily.    Dispense:  15 mL    Refill:  3   Other Orders Orders Placed This Encounter  Procedures  . Glucose, capillary  . Ambulatory referral to Sports Medicine    Referral Priority:  Routine    Referral Type:  Consultation    Number of Visits Requested:  1  . POCT HgB A1C

## 2014-07-24 NOTE — Patient Instructions (Signed)
Your lantus insulin has been refilled.  You will get a call about setting up an appointment at sports medicine for injections in your hips. After that, we can consider starting physical therapy to help stretch and strengthen your hips.  General Instructions:   Please bring your medicines with you each time you come to clinic.  Medicines may include prescription medications, over-the-counter medications, herbal remedies, eye drops, vitamins, or other pills.   Progress Toward Treatment Goals:  Treatment Goal 02/24/2014  Hemoglobin A1C unchanged  Prevent falls at goal    Self Care Goals & Plans:  Self Care Goal 07/24/2014  Manage my medications take my medicines as prescribed; bring my medications to every visit; refill my medications on time  Monitor my health keep track of my blood glucose; bring my glucose meter and log to each visit  Eat healthy foods -  Be physically active find an activity I enjoy  Prevent falls -  Meeting treatment goals -    Home Blood Glucose Monitoring 02/24/2014  Check my blood sugar no home glucose monitoring  When to check my blood sugar N/A     Care Management & Community Referrals:  Referral 02/24/2014  Referrals made for care management support -  Referrals made to community resources none         Hip Bursitis Bursitis is a swelling and soreness (inflammation) of a fluid-filled sac (bursa). This sac overlies and protects the joints.  CAUSES   Injury.  Overuse of the muscles surrounding the joint.  Arthritis.  Gout.  Infection.  Cold weather.  Inadequate warm-up and conditioning prior to activities. The cause may not be known.  SYMPTOMS   Mild to severe irritation.  Tenderness and swelling over the outside of the hip.  Pain with motion of the hip.  If the bursa becomes infected, a fever may be present. Redness, tenderness, and warmth will develop over the hip. Symptoms usually lessen in 3 to 4 weeks with treatment,  but can come back. TREATMENT If conservative treatment does not work, your caregiver may advise draining the bursa and injecting cortisone into the area. This may speed up the healing process. This may also be used as an initial treatment of choice. HOME CARE INSTRUCTIONS   Apply ice to the affected area for 15-20 minutes every 3 to 4 hours while awake for the first 2 days. Put the ice in a plastic bag and place a towel between the bag of ice and your skin.  Rest the painful joint as much as possible, but continue to put the joint through a normal range of motion at least 4 times per day. When the pain lessens, begin normal, slow movements and usual activities to help prevent stiffness of the hip.  Only take over-the-counter or prescription medicines for pain, discomfort, or fever as directed by your caregiver.  Use crutches to limit weight bearing on the hip joint, if advised.  Elevate your painful hip to reduce swelling. Use pillows for propping and cushioning your legs and hips.  Gentle massage may provide comfort and decrease swelling. SEEK IMMEDIATE MEDICAL CARE IF:   Your pain increases even during treatment, or you are not improving.  You have a fever.  You have heat and inflammation over the involved bursa.  You have any other questions or concerns. MAKE SURE YOU:   Understand these instructions.  Will watch your condition.  Will get help right away if you are not doing well or get worse. Document Released:  08/12/2001 Document Revised: 05/15/2011 Document Reviewed: 03/11/2008 [employer] Patient Information 2015 [place], [place]. This information is not intended to replace advice given to you by your health care provider. Make sure you discuss any questions you have with your health care provider.

## 2014-07-27 NOTE — Assessment & Plan Note (Addendum)
Patient has persistent bilateral hip pain; it is sharp and aching in quality, worsened by lateral palpation and by lying on either side. These characteristics and her exam point toward trochanteric bursitis and the patient would likely benefit from glucocorticoid injections.   - Referral to sports medicine for opinion and consideration of glucocorticoid injections to alleviate pain - Consider PT in the future, once the patient's pain is controlled (and thus, she is better able to participate). Discussed this plan for PT with patient, who is amenable - Discussed importance of maintaining improvement in glucose control in the context of these steroid injections

## 2014-07-27 NOTE — Assessment & Plan Note (Addendum)
Lab Results  Component Value Date   HGBA1C 11.5 07/24/2014   HGBA1C >14.0 02/24/2014   HGBA1C >14.0 09/16/2013     Assessment: Diabetes control: 11.5%    Progress toward A1C goal: improved from >14% on last visit    Comments: Patient claims that she has been compliant with her Lantus insulin since her last visit; it appears that she has indeed had better control of her sugars. She has not been checking her CBGs. She has had some success changing injection sites, though she continues to experience some pain at the site during injection. She appears motivated to continue use of Lantus given the improved A1c today.  Plan: Medications:  continue current medications; Lantus 45 units qhs Home glucose monitoring: Frequency:  none  Instruction/counseling given: reminded to bring medications to each visit Other plans:   - Repeat A1c in 3 months, especially in the context of steroid hip injections with sports medicine that are to start soon

## 2014-07-28 NOTE — Progress Notes (Signed)
Case discussed with Dr. Mallory at time of visit. We reviewed the resident's history and exam and pertinent patient test results. I agree with the assessment, diagnosis, and plan of care documented in the resident's note. 

## 2014-07-31 ENCOUNTER — Encounter: Payer: Self-pay | Admitting: *Deleted

## 2014-09-11 ENCOUNTER — Encounter: Payer: Self-pay | Admitting: Family Medicine

## 2014-09-11 ENCOUNTER — Ambulatory Visit (INDEPENDENT_AMBULATORY_CARE_PROVIDER_SITE_OTHER): Payer: Medicaid Other | Admitting: Family Medicine

## 2014-09-11 VITALS — BP 113/77 | HR 80 | Ht 64.0 in | Wt 158.0 lb

## 2014-09-11 DIAGNOSIS — M25552 Pain in left hip: Secondary | ICD-10-CM

## 2014-09-11 DIAGNOSIS — M25551 Pain in right hip: Secondary | ICD-10-CM

## 2014-09-11 NOTE — Progress Notes (Signed)
[name], female   DOB: Apr 30, 1970, 44 y.o.   MRN: [id number]  Adriana Burke - 44 y.o. female MRN [id number]  Date of birth: 1970-03-28    SUBJECTIVE:     Bilateral but right greater than left hip pain. Pain is severe, 8 out of 10 most of the time. Worse with extended periods of sitting. Has had this problem for several years, worsening over the last 6-12 months. Pain is so problematic at this point that she has trouble sitting or lying down. If she goes out to dinner with her family, when she stands up after dinner she is so stiff and hurts so much that she is embarrassed. Bilateral hip pain but right is worse and sometimes the right radiates into the groin area. She does not have that type of radiation on the left hip. No leg weakness. No falls. No numbness in her lower extremities.  ROS:     No unusual weight changes, fever, sweats, chills. She has multiple arthralgias and ankles, shoulders and knees but not as severe as her hips. She's had no unusual rash, no extremity edema.  PERTINENT  PMH / PSH FH / / SH:  Past Medical, Surgical, Social, and Family History Reviewed & Updated in the EMR.  Pertinent findings include:  Decreased hearing Diabetes mellitus type 2, poor control History of diagnosis of room is her arthritis although is is not clear in her chart. History of peripheral neuropathy and fibromyalgia although she does not report any numbness in her extremities. She reports no surgeries on her hips. She does report hip fracture on the right as a child but does not have any further details of that.    OBJECTIVE: BP 113/77 mmHg  Pulse 80  Ht 5\' 4"  (1.626 m)  Wt 158 lb (71.668 kg)  BMI 27.11 kg/m2  Physical Exam:  Vital signs are reviewed. GEN.: Well-developed female no acute distress HIPS: Internal and external rotation is painful particularly on the right hip external rotation is limited by pain to about 40 external. MSK muscle bulk and tone is symmetrical  and normal. She has no tenderness to palpation of the thigh muscles. 5 out of 5 strength hip flexor and extensor, knee flexor extensor,'s plantarflexion dorsiflexion. NEURO: Intact sensation to soft touch bilaterally feet knees which is symmetrical.  ASSESSMENT & PLAN:  See problem based charting & AVS for pt instructions.

## 2014-09-11 NOTE — Assessment & Plan Note (Signed)
Her story is a little bit confusing as far as her previous workup. I did review the hip films she had in the last 2 years. Does seem to be question of some cystic changes in the inferior portion of the acetabulum it was not commented on by the radiologist. Certainly she has limited range of motion secondary to pain in internal and external rotation that is greater on the right than the left. She has had screening rheumatological lab work but can't find it in the chart. I think to move further in diagnosis and possible treatment we need to reimage her hip with MRI. I will call her with results of that.

## 2014-09-16 ENCOUNTER — Ambulatory Visit (INDEPENDENT_AMBULATORY_CARE_PROVIDER_SITE_OTHER): Payer: Medicaid Other | Admitting: Internal Medicine

## 2014-09-16 ENCOUNTER — Encounter: Payer: Self-pay | Admitting: Internal Medicine

## 2014-09-16 VITALS — BP 128/67 | HR 99 | Temp 98.2°F | Ht 64.0 in | Wt 160.7 lb

## 2014-09-16 DIAGNOSIS — Z0000 Encounter for general adult medical examination without abnormal findings: Secondary | ICD-10-CM

## 2014-09-16 DIAGNOSIS — B373 Candidiasis of vulva and vagina: Secondary | ICD-10-CM | POA: Diagnosis not present

## 2014-09-16 DIAGNOSIS — E1165 Type 2 diabetes mellitus with hyperglycemia: Secondary | ICD-10-CM

## 2014-09-16 DIAGNOSIS — N3000 Acute cystitis without hematuria: Secondary | ICD-10-CM | POA: Diagnosis not present

## 2014-09-16 DIAGNOSIS — B9689 Other specified bacterial agents as the cause of diseases classified elsewhere: Secondary | ICD-10-CM | POA: Diagnosis not present

## 2014-09-16 DIAGNOSIS — N3001 Acute cystitis with hematuria: Secondary | ICD-10-CM

## 2014-09-16 DIAGNOSIS — N898 Other specified noninflammatory disorders of vagina: Secondary | ICD-10-CM | POA: Insufficient documentation

## 2014-09-16 DIAGNOSIS — B3731 Acute candidiasis of vulva and vagina: Secondary | ICD-10-CM

## 2014-09-16 LAB — POCT URINALYSIS DIPSTICK
Bilirubin, UA: NEGATIVE
Glucose, UA: 500
Ketones, UA: NEGATIVE
Nitrite, UA: NEGATIVE
Protein, UA: 30
Spec Grav, UA: 1.02
Urobilinogen, UA: 0.2
pH, UA: 5.5

## 2014-09-16 LAB — POCT URINE PREGNANCY: Preg Test, Ur: NEGATIVE

## 2014-09-16 LAB — GLUCOSE, CAPILLARY: GLUCOSE-CAPILLARY: 255 mg/dL — AB (ref 65–99)

## 2014-09-16 MED ORDER — SULFAMETHOXAZOLE-TRIMETHOPRIM 800-160 MG PO TABS: 1.0000 | ORAL_TABLET | Freq: Two times a day (BID) | ORAL | Status: DC

## 2014-09-16 MED ORDER — FLUCONAZOLE 150 MG PO TABS: 150.0000 mg | ORAL_TABLET | Freq: Once | ORAL | Status: DC

## 2014-09-16 NOTE — Progress Notes (Signed)
Internal Medicine Clinic Attending  Case discussed with Dr. Rabbani at the time of the visit.  We reviewed the resident's history and exam and pertinent patient test results.  I agree with the assessment, diagnosis, and plan of care documented in the resident's note.  

## 2014-09-16 NOTE — Assessment & Plan Note (Addendum)
Assessment: Pt with uncontrolled Type 2 DM (A1c 11.5) with 8-day history of urinary symptoms found to have uncomplicated acute cystitis.   Plan: -Obtain POCT urine pregnancy test ---> negative -Obtain POCT urine dipstick ---> small leukocytes, glucosuria (500), and trace blood -Obtain urine cultures  -Obtain POCT CBG ---> 255 -Prescribe bactrim-DS 800-160 mg BID for 5 day course

## 2014-09-16 NOTE — Assessment & Plan Note (Signed)
-  Pt to return for pap smear testing -Obtain screening HIV Ab and 25-OH vitamin D level at next visit

## 2014-09-16 NOTE — Patient Instructions (Addendum)
-Start taking bactrim twice a day for 5 days until all the pills are gone for urinary tract infection -Take diflucan for vaginal yeast infection once, after you finish the antibiotics for yeast infection, it it reoccurs take another pill -Please come back for a pap smear and diabetes follow-up -Very nice meeting you!  Urinary Tract Infection Urinary tract infections (UTIs) can develop anywhere along your urinary tract. Your urinary tract is your body's drainage system for removing wastes and extra water. Your urinary tract includes two kidneys, two ureters, a bladder, and a urethra. Your kidneys are a pair of bean-shaped organs. Each kidney is about the size of your fist. They are located below your ribs, one on each side of your spine. CAUSES Infections are caused by microbes, which are microscopic organisms, including fungi, viruses, and bacteria. These organisms are so small that they can only be seen through a microscope. Bacteria are the microbes that most commonly cause UTIs. SYMPTOMS  Symptoms of UTIs may vary by age and gender of the patient and by the location of the infection. Symptoms in young women typically include a frequent and intense urge to urinate and a painful, burning feeling in the bladder or urethra during urination. Older women and men are more likely to be tired, shaky, and weak and have muscle aches and abdominal pain. A fever may mean the infection is in your kidneys. Other symptoms of a kidney infection include pain in your back or sides below the ribs, nausea, and vomiting. DIAGNOSIS To diagnose a UTI, your caregiver will ask you about your symptoms. Your caregiver also will ask to provide a urine sample. The urine sample will be tested for bacteria and white blood cells. White blood cells are made by your body to help fight infection. TREATMENT  Typically, UTIs can be treated with medication. Because most UTIs are caused by a bacterial infection, they usually can be  treated with the use of antibiotics. The choice of antibiotic and length of treatment depend on your symptoms and the type of bacteria causing your infection. HOME CARE INSTRUCTIONS  If you were prescribed antibiotics, take them exactly as your caregiver instructs you. Finish the medication even if you feel better after you have only taken some of the medication.  Drink enough water and fluids to keep your urine clear or pale yellow.  Avoid caffeine, tea, and carbonated beverages. They tend to irritate your bladder.  Empty your bladder often. Avoid holding urine for long periods of time.  Empty your bladder before and after sexual intercourse.  After a bowel movement, women should cleanse from front to back. Use each tissue only once. SEEK MEDICAL CARE IF:   You have back pain.  You develop a fever.  Your symptoms do not begin to resolve within 3 days. SEEK IMMEDIATE MEDICAL CARE IF:   You have severe back pain or lower abdominal pain.  You develop chills.  You have nausea or vomiting.  You have continued burning or discomfort with urination. MAKE SURE YOU:   Understand these instructions.  Will watch your condition.  Will get help right away if you are not doing well or get worse. Document Released: 11/30/2004 Document Revised: 08/22/2011 Document Reviewed: 03/31/2011 [facility] Patient Information 2015 [place], [place]. This information is not intended to replace advice given to you by your health care provider. Make sure you discuss any questions you have with your health care provider.  Candidal Vulvovaginitis Candidal vulvovaginitis is an infection of the vagina and  vulva. The vulva is the skin around the opening of the vagina. This may cause itching and discomfort in and around the vagina.  HOME CARE  Only take medicine as told by your doctor.  Do not have sex (intercourse) until the infection is healed or as told by your doctor.  Practice safe sex.  Tell your sex  partner about your infection.  Do not douche or use tampons.  Wear cotton underwear. Do not wear tight pants or panty hose.  Eat yogurt. This may help treat and prevent yeast infections. GET HELP RIGHT AWAY IF:   You have a fever.  Your problems get worse during treatment or do not get better in 3 days.  You have discomfort, irritation, or itching in your vagina or vulva area.  You have pain after sex.  You start to get belly (abdominal) pain. MAKE SURE YOU:  Understand these instructions.  Will watch your condition.  Will get help right away if you are not doing well or get worse. Document Released: 05/19/2008 Document Revised: 02/25/2013 Document Reviewed: 05/19/2008 [facility] Patient Information 2015 [place], [place]. This information is not intended to replace advice given to you by your health care provider. Make sure you discuss any questions you have with your health care provider.   General Instructions:   Please bring your medicines with you each time you come to clinic.  Medicines may include prescription medications, over-the-counter medications, herbal remedies, eye drops, vitamins, or other pills.   Progress Toward Treatment Goals:  Treatment Goal 02/24/2014  Hemoglobin A1C unchanged  Prevent falls at goal    Self Care Goals & Plans:  Self Care Goal 09/16/2014  Manage my medications take my medicines as prescribed; bring my medications to every visit; refill my medications on time  Monitor my health keep track of my blood glucose; bring my glucose meter and log to each visit; check my feet daily  Eat healthy foods drink diet soda or water instead of juice or soda; eat more vegetables; eat foods that are low in salt; eat baked foods instead of fried foods; eat fruit for snacks and desserts  Be physically active -  Prevent falls -  Meeting treatment goals -    Home Blood Glucose Monitoring 02/24/2014  Check my blood sugar no home glucose monitoring  When  to check my blood sugar N/A     Care Management & Community Referrals:  Referral 02/24/2014  Referrals made for care management support -  Referrals made to community resources none

## 2014-09-16 NOTE — Assessment & Plan Note (Signed)
Assessment: Pt with uncontrolled Type 2 DM (A1c 11.5) who presents with 8-day history of vaginal itching and discharge most likely due to vaginal candidiasis.   Plan:  -Prescribe fluconazole 150 mg for 1 dose

## 2014-09-16 NOTE — Progress Notes (Signed)
[name], female   DOB: Jul 14, 1970, 44 y.o.   MRN: [id number]    Subjective:   [name] female   DOB: 1971/01/22 44 y.o.   MRN: [id number]  HPI: Adriana Burke is a 44 y.o. 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   She reports urinary frequency, urgency, left flank pain, vaginal itching, and white non-odorous cottage cheese like vaginal discharge without dysuria for the past 8 days. She reports using different body soap recently. Her LMP was on July 5th and reports an episode of mild gross hematuria. She reports having her tubes tied and is sexually active with one female partner. Last pap smear on 02/24/13 was normal. She reports chronic nausea and 3 episodes of vomiting but denies fever or chills. She has history of UTI and vaginal yeast infection but no history of kidney stones and last abdominal US on 10/11/12 was normal. She has uncontrolled Type 2 DM with last A1c of 11.5 on 07/24/14. She reports compliance with taking Lantus 45 U daily with blood sugars at home in the 300-400's.     Past Medical History  Diagnosis Date  . Peripheral neuropathy   . Fibromyalgia   . Episodic recurrent vertigo   . Vitamin D deficiency     history of  . Hyperlipidemia   . Diabetes mellitus   . Chronic rhinitis   . Depression   . Asthma   . Tinnitus     history of, Evaluated by ENT> [place]  . Rheumatoid arthritis(714.0)     seen by Rheumatology in [facility], Rheumatoid Factor pos, Anti CCP pos in 2005 -? early presentation of RF  . Obesity   . History of hip fracture     hx childhood hip fracture   Current Outpatient Prescriptions  Medication Sig Dispense Refill  . albuterol (PROVENTIL HFA;VENTOLIN HFA) 108 (90 BASE) MCG/ACT inhaler Inhale 2 puffs into the lungs every 6 (six) hours  as needed for wheezing or shortness of breath. 1 Inhaler 5  . benzonatate (TESSALON) 100 MG capsule Take 1 capsule (100 mg total) by mouth every 8 (eight) hours. 21 capsule 0  . diclofenac sodium (VOLTAREN) 1 % GEL Apply 2 g topically 4 (four) times daily. (Patient taking differently: Apply 2 g topically 4 (four) times daily as needed (pain). ) 100 g 1  . gabapentin (NEURONTIN) 300 MG capsule Take 1 capsule (300 mg total) by mouth at bedtime. If pain doesn't improve, take 1 more tablet. Do NOT take more than 3 tablets. 30 capsule 2  . HYDROcodone-acetaminophen (NORCO/VICODIN) 5-325 MG per tablet Take 1 tablet by mouth every 6 (six) hours as needed. (Patient taking differently: Take 1 tablet by mouth every 6 (six) hours as needed for moderate pain. ) 15 tablet 0  . ibuprofen (ADVIL,MOTRIN) 600 MG tablet Take 1 tablet (600 mg total) by mouth every 8 (eight) hours as needed. (Patient taking differently: Take 600 mg by mouth every 8 (eight) hours as needed for moderate pain. ) 60 tablet 0  . insulin glargine (LANTUS) 100 unit/mL SOPN Inject 0.45 mLs (45 Units total) into the skin daily. 15 mL 3  . loratadine (CLARITIN) 10 MG tablet Take 1 tablet (10 mg total) by mouth daily. 30 tablet 0  . pantoprazole (PROTONIX) 20 MG tablet Take 1 tablet (20 mg total) by mouth daily. (Patient  not taking: Reported on 05/25/2014) 30 tablet 1  . predniSONE (DELTASONE) 20 MG tablet 3 tabs po day one, then 2 tabs daily x 4 days (Patient not taking: Reported on 09/11/2014) 11 tablet 0   No current facility-administered medications for this visit.   Family History  Problem Relation Age of Onset  . Hypertension Mother   . Diabetes Maternal Grandmother   . Arthritis Maternal Grandmother   . Kidney disease Maternal Grandmother    History   Social History  . Marital Status: Legally Separated    Spouse Name: N/A  . Number of Children: N/A  . Years of Education: 11   Occupational History  .     Social History Main  Topics  . Smoking status: Never Smoker   . Smokeless tobacco: Not on file  . Alcohol Use: 0.0 oz/week    0 Standard drinks or equivalent per week     Comment: occasionally  . Drug Use: No  . Sexual Activity: Not on file   Other Topics Concern  . Not on file   Social History Narrative   [phone]   Review of Systems: Review of Systems  Constitutional: Negative for fever and chills.  HENT: Positive for congestion.        Sinus pressure  Eyes: Positive for blurred vision (chronic).  Respiratory: Positive for cough. Negative for shortness of breath and wheezing.   Cardiovascular: Positive for chest pain (occasionally) and leg swelling (left LE).  Gastrointestinal: Positive for nausea (chronic) and vomiting (3 episodes ). Negative for abdominal pain (left upper/flank), diarrhea and constipation.  Genitourinary: Positive for urgency, frequency, hematuria (resolved) and flank pain (left ). Negative for dysuria.       Vaginal discharge with itching  Musculoskeletal: Positive for back pain (low ) and joint pain (b/l hips and b/l knees).  Neurological: Positive for sensory change (uncontrolled chronic peripheral neuropathy ) and headaches. Negative for dizziness.  Endo/Heme/Allergies: Positive for environmental allergies and polydipsia (chronic).     Objective:  Physical Exam: Filed Vitals:   09/16/14 1601  BP: 128/67  Pulse: 99  Temp: 98.2 F (36.8 C)  TempSrc: Oral  Height: 5\' 4"  (1.626 m)  Weight: 160 lb 11.2 oz (72.893 kg)  SpO2: 99%    Physical Exam  Constitutional: She is oriented to person, place, and time. She appears well-developed and well-nourished. No distress.  HENT:  Head: Normocephalic and atraumatic.  Left Ear: External ear normal.  Nose: Nose normal.  Mouth/Throat: Oropharynx is clear and moist. No oropharyngeal exudate.  Eyes: Conjunctivae and EOM are normal. Pupils are equal, round, and reactive to light. Right eye exhibits no discharge. Left eye exhibits  no discharge. No scleral icterus.  Neck: Normal range of motion. Neck supple.  Cardiovascular: Normal rate, regular rhythm and normal heart sounds.   No murmur heard. Pulmonary/Chest: Effort normal and breath sounds normal. No respiratory distress. She has no wheezes. She has no rales.  Abdominal: Soft. Bowel sounds are normal. She exhibits no distension. There is no tenderness. There is no rebound and no guarding.  No CVA tenderness  Musculoskeletal: Normal range of motion. She exhibits no edema or tenderness.  Neurological: She is alert and oriented to person, place, and time.  Skin: Skin is warm and dry. No rash noted. She is not diaphoretic. No erythema. No pallor.  Psychiatric: She has a normal mood and affect. Her behavior is normal. Judgment and thought content normal.    Assessment & Plan:   Please see  problem list for problem-based assessment and plan

## 2014-09-17 ENCOUNTER — Other Ambulatory Visit: Payer: Medicaid Other

## 2014-09-17 LAB — URINE CULTURE

## 2014-09-19 ENCOUNTER — Ambulatory Visit
Admission: RE | Admit: 2014-09-19 | Discharge: 2014-09-19 | Disposition: A | Discharge status: Home / Self Care | Payer: Medicaid Other | Source: Ambulatory Visit | Attending: Family Medicine | Admitting: Family Medicine

## 2014-09-19 DIAGNOSIS — M25552 Pain in left hip: Principal | ICD-10-CM

## 2014-09-19 DIAGNOSIS — M25551 Pain in right hip: Secondary | ICD-10-CM

## 2014-09-22 ENCOUNTER — Encounter: Payer: Self-pay | Admitting: Family Medicine

## 2014-09-22 ENCOUNTER — Telehealth: Payer: Self-pay | Admitting: Family Medicine

## 2014-09-22 NOTE — Telephone Encounter (Signed)
Rhea or [place] Can u callher and tellher the hips MRI was NEGATIVE for any sign of bone damage or hip pathology. I am sending a copy of report in mail to her.   I am not doubting her pain, but Ido nto know what is wrong with her hip---she would probably best follow up wih a rheumatologist .   IF she wants, we can TRY an intra-articular hip injection under US--I am not sure if it would be helpful as nothing obvious on MRI--it would no tbe something we would do multiple injections forb ut we could try one and see if it gave her somelong lasting improvement. Basically that is all I have to offer from a sports medicine standpoint.  Otherwise she needs to get w her PCp and see about referral for rheum. THANKS! Dorcas Mcmurray

## 2014-09-25 ENCOUNTER — Ambulatory Visit: Payer: Medicaid Other | Admitting: Family Medicine

## 2014-10-12 ENCOUNTER — Ambulatory Visit: Payer: Medicaid Other | Admitting: Family Medicine

## 2014-10-16 ENCOUNTER — Telehealth: Payer: Self-pay | Admitting: Internal Medicine

## 2014-10-16 ENCOUNTER — Ambulatory Visit (INDEPENDENT_AMBULATORY_CARE_PROVIDER_SITE_OTHER): Payer: Medicaid Other | Admitting: Internal Medicine

## 2014-10-16 ENCOUNTER — Other Ambulatory Visit (HOSPITAL_COMMUNITY)
Admission: RE | Admit: 2014-10-16 | Discharge: 2014-10-16 | Disposition: A | Discharge status: Home / Self Care | Payer: Medicaid Other | Source: Ambulatory Visit | Attending: Internal Medicine | Admitting: Internal Medicine

## 2014-10-16 ENCOUNTER — Encounter: Payer: Self-pay | Admitting: Internal Medicine

## 2014-10-16 VITALS — BP 117/75 | HR 90 | Temp 98.4°F | Wt 156.2 lb

## 2014-10-16 DIAGNOSIS — N390 Urinary tract infection, site not specified: Secondary | ICD-10-CM

## 2014-10-16 DIAGNOSIS — Z113 Encounter for screening for infections with a predominantly sexual mode of transmission: Secondary | ICD-10-CM | POA: Insufficient documentation

## 2014-10-16 DIAGNOSIS — B951 Streptococcus, group B, as the cause of diseases classified elsewhere: Secondary | ICD-10-CM

## 2014-10-16 DIAGNOSIS — N3001 Acute cystitis with hematuria: Secondary | ICD-10-CM

## 2014-10-16 DIAGNOSIS — R300 Dysuria: Secondary | ICD-10-CM

## 2014-10-16 NOTE — Patient Instructions (Signed)
I will call you back on Monday with results of your lab work.

## 2014-10-16 NOTE — Telephone Encounter (Signed)
Pt does not want a refill, she states she is no better from her UTI that was treated by dr Naaman Plummer, she states she took the abx as prescribed but feels no different, she also states no one called her to discuss results of lab test with her. appt scheduled dr patel at 1545, told her to be at [place] at 1600 due to pt's needs before her

## 2014-10-16 NOTE — Assessment & Plan Note (Addendum)
Overview Since her last visit, she reports that her symptoms have persisted and describes them as a "mild irritation" in a "icky feeling." She reported completing a course of Bactrim along with a one-time dose of Diflucan as instructed by Dr. Naaman Plummer. Her last period was sometime last month, and she reports being sexually active with her fianc 15 years following the course of treatment. There is associated hematuria albeit minimally intermittent and polyuria which she attributes to her poorly controlled diabetes. She denies any fever or chills  On physical exam, there was significant guarding which prevented the insertion of the speculum to perform a complete pelvic exam or obtaining an adequate specimen for wet prep.  Assessment Unclear etiology. It is quite possible that she may have recurrent candidiasis or UTI though no discharge was noted at the meatus. Pregnancy is also possible as well though she adamantly denies this possibility.   Plan -Check urine culture, urinalysis, GC/chlamydia, pregnancy test and call patient with results -Offered HIV testing though she declined -Advised her not to take any leftover nystatin sitting at home until we have proof that this is an infection  ADDENDUM 10/19/2014  1:18 PM:  Urine culture notable for Group B strep. Per Up to Date, Bactrim not effective so possible her symptoms did not completely improve after last visit. Unclear of why she has hematuria. Called patient and will treat with Keflex 500mg  BID x 7 days. She denied any prior rxn to this antibiotic.  ADDENDUM 10/21/2014  5:17 PM:  GC/Chlamydia negative.

## 2014-10-16 NOTE — Telephone Encounter (Signed)
She has appt today at 3:45 and can discuss it then.

## 2014-10-16 NOTE — Progress Notes (Signed)
   Subjective:    [name], female    DOB: 1970/09/15, 44 y.o.   MRN: [id number]  HPI Adriana Burke is a 44 year old female with poorly controlled type 2 diabetes, vitamin D deficiency, chronic pain who presents today for follow-up.  Please see assessment & plan for documentation of each problem.   Review of Systems  Genitourinary: Positive for dysuria (Less pain, more irritation), frequency, hematuria (intermittent) and vaginal discharge (Initially denied but then reported). Negative for flank pain, decreased urine volume and dyspareunia.       Objective:   Physical Exam  Constitutional: She appears well-developed and well-nourished. No distress.  Genitourinary: Uterus normal. There is no rash, tenderness, lesion or injury on the right labia. There is no rash, tenderness, lesion or injury on the left labia. No vaginal discharge found.  Skin: She is not diaphoretic.          Assessment & Plan:

## 2014-10-16 NOTE — Telephone Encounter (Signed)
Pt called requesting the nurse to call back about her yeast infection med.

## 2014-10-17 LAB — MICROSCOPIC EXAMINATION: Casts: NONE SEEN /lpf

## 2014-10-17 LAB — URINALYSIS, COMPLETE
BILIRUBIN UA: NEGATIVE
KETONES UA: NEGATIVE
Nitrite, UA: NEGATIVE
PH UA: 5.5 (ref 5.0–7.5)
PROTEIN UA: NEGATIVE
Specific Gravity, UA: >=1.03 — AB (ref 1.005–1.030)
Urobilinogen, Ur: 0.2 mg/dL (ref 0.2–1.0)

## 2014-10-17 LAB — PREGNANCY, URINE: Preg Test, Ur: NEGATIVE

## 2014-10-18 LAB — URINE CULTURE

## 2014-10-19 LAB — URINE CYTOLOGY ANCILLARY ONLY
CHLAMYDIA, DNA PROBE: NEGATIVE
NEISSERIA GONORRHEA: NEGATIVE

## 2014-10-19 MED ORDER — CEPHALEXIN 500 MG PO CAPS: 500.0000 mg | ORAL_CAPSULE | Freq: Two times a day (BID) | ORAL | Status: DC

## 2014-10-19 NOTE — Addendum Note (Signed)
Addended by: Riccardo Dubin on: 10/19/2014 01:19 PM   Modules accepted: Orders

## 2014-10-19 NOTE — Progress Notes (Signed)
Internal Medicine Clinic Attending  Case discussed with Dr. Patel,Rushil soon after the resident saw the patient.  We reviewed the resident's history and exam and pertinent patient test results.  I agree with the assessment, diagnosis, and plan of care documented in the resident's note. 

## 2014-11-13 ENCOUNTER — Encounter: Payer: Self-pay | Admitting: Internal Medicine

## 2014-11-13 ENCOUNTER — Other Ambulatory Visit (HOSPITAL_COMMUNITY)
Admission: RE | Admit: 2014-11-13 | Discharge: 2014-11-13 | Disposition: A | Discharge status: Home / Self Care | Payer: Medicaid Other | Source: Ambulatory Visit | Attending: Student in an Organized Health Care Education/Training Program | Admitting: Student in an Organized Health Care Education/Training Program

## 2014-11-13 ENCOUNTER — Ambulatory Visit (INDEPENDENT_AMBULATORY_CARE_PROVIDER_SITE_OTHER): Payer: Medicaid Other | Admitting: Internal Medicine

## 2014-11-13 ENCOUNTER — Other Ambulatory Visit (HOSPITAL_COMMUNITY)
Admission: RE | Admit: 2014-11-13 | Discharge: 2014-11-13 | Disposition: A | Discharge status: Home / Self Care | Payer: Medicaid Other | Source: Ambulatory Visit | Attending: Internal Medicine | Admitting: Internal Medicine

## 2014-11-13 VITALS — BP 133/87 | HR 92 | Temp 98.1°F | Ht 64.0 in | Wt 152.6 lb

## 2014-11-13 DIAGNOSIS — E1165 Type 2 diabetes mellitus with hyperglycemia: Secondary | ICD-10-CM | POA: Diagnosis not present

## 2014-11-13 DIAGNOSIS — N760 Acute vaginitis: Secondary | ICD-10-CM | POA: Insufficient documentation

## 2014-11-13 DIAGNOSIS — N898 Other specified noninflammatory disorders of vagina: Secondary | ICD-10-CM

## 2014-11-13 DIAGNOSIS — IMO0002 Reserved for concepts with insufficient information to code with codable children: Secondary | ICD-10-CM

## 2014-11-13 DIAGNOSIS — Z1151 Encounter for screening for human papillomavirus (HPV): Secondary | ICD-10-CM | POA: Diagnosis present

## 2014-11-13 DIAGNOSIS — N390 Urinary tract infection, site not specified: Secondary | ICD-10-CM

## 2014-11-13 DIAGNOSIS — Z113 Encounter for screening for infections with a predominantly sexual mode of transmission: Secondary | ICD-10-CM | POA: Insufficient documentation

## 2014-11-13 DIAGNOSIS — R319 Hematuria, unspecified: Secondary | ICD-10-CM

## 2014-11-13 DIAGNOSIS — Z794 Long term (current) use of insulin: Secondary | ICD-10-CM | POA: Diagnosis not present

## 2014-11-13 DIAGNOSIS — Z01419 Encounter for gynecological examination (general) (routine) without abnormal findings: Secondary | ICD-10-CM | POA: Diagnosis present

## 2014-11-13 DIAGNOSIS — E118 Type 2 diabetes mellitus with unspecified complications: Principal | ICD-10-CM

## 2014-11-13 DIAGNOSIS — Z0000 Encounter for general adult medical examination without abnormal findings: Secondary | ICD-10-CM

## 2014-11-13 LAB — POCT URINALYSIS DIPSTICK
BILIRUBIN UA: NEGATIVE
Glucose, UA: 500
Ketones, UA: NEGATIVE
NITRITE UA: NEGATIVE
PH UA: 6
Protein, UA: NEGATIVE
SPEC GRAV UA: 1.01
UROBILINOGEN UA: 0.2

## 2014-11-13 LAB — POCT GLYCOSYLATED HEMOGLOBIN (HGB A1C): Hemoglobin A1C: 13.7

## 2014-11-13 LAB — GLUCOSE, CAPILLARY: GLUCOSE-CAPILLARY: 425 mg/dL — AB (ref 65–99)

## 2014-11-13 MED ORDER — INSULIN ASPART 100 UNIT/ML ~~LOC~~ SOLN: 5.0000 [IU] | Freq: Once | SUBCUTANEOUS | Status: DC

## 2014-11-13 MED ORDER — INSULIN ASPART 100 UNIT/ML ~~LOC~~ SOLN
5.0000 [IU] | Freq: Once | SUBCUTANEOUS | Status: AC
  Administered 2014-11-13: 5 [IU] via SUBCUTANEOUS

## 2014-11-13 NOTE — Progress Notes (Signed)
[name], female   DOB: 1970-12-19, 44 y.o.   MRN: [id number]   Subjective:   [name] female   DOB: [birth year] 44 y.o.   MRN: [id number]  HPI: [name] is a 44 y.o. with PMH listed below. Presented today with c/o pressure when she passes urine she has this pressure in her lower abdominal pain, an is worse with some certain types of tissues, but is not present when she use wash cloths. She enodors increased frequency, but he rblood sugar are always high. She endorses increased vaginal discharge with itching, discharge described as whitish and thick, no foul odour. She is sexually active. She here  last month and two months ago for similar complaints, she felt Little better after the course of kefelx she was given at her last visit. She was treated with fluconazole the first time she complianed about this. A speculum exam could not be obtained last visit, as pt was anxious and could not relax.  Past Medical History  Diagnosis Date  . Peripheral neuropathy   . Fibromyalgia   . Episodic recurrent vertigo   . Vitamin D deficiency     history of  . Hyperlipidemia   . Diabetes mellitus   . Chronic rhinitis   . Depression   . Asthma   . Tinnitus     history of, Evaluated by ENT>   . Rheumatoid arthritis(714.0)     seen by Rheumatology in [facility], Rheumatoid Factor pos, Anti CCP pos in 2005 -? early presentation of RF  . Obesity   . History of hip fracture     hx childhood hip fracture   Review of Systems: CONSTITUTIONAL- No Fever, no change in appetite. SKIN- No Rash, colour changes  HEAD- No Headache or dizziness. RESPIRATORY- No Cough or SOB. CARDIAC- No Palpitations, no chest pain. GI- No vomiting, diarrhoea, abd pain.  Objective:  Physical Exam: Filed Vitals:   11/13/14 1538  BP: 133/87  Pulse: 92  Temp: 98.1 F (36.7 C)  TempSrc: Oral  Height: 5\' 4"  (1.626 m)  Weight: 152 lb 9.6 oz (69.219 kg)  SpO2: 99%    GENERAL- alert, co-operative, appears as stated age, not in any distress. HEENT- Atraumatic, normocephalic, PERRL,  CARDIAC- RRR, no murmurs, rubs or gallops. RESP- Moving equal volumes of air, and clear to auscultation bilaterally, no wheezes or crackles. ABDOMEN- Soft, nontender, no palpable masses or organomegaly, bowel sounds present. Pelvic exam- Speculum- Copious Discharge present in vagina, creamy coloured, cervix appeared healthy, no erythema or masses noted in vulva or vagina. Bimanuals exam- NO cervical motion tenderness.  NEURO- Alert and orientd, Gait- Normal. EXTREMITIES- pulse 2+, symmetric, no pedal edema. SKIN- Warm, dry, No rash or lesion. PSYCH- Normal mood and affect, appropriate thought content and speech.  Assessment & Plan:   The patient's case and plan of care was discussed with attending physician, Dr. Evette Doffing.  Please see problem based charting for assessment and plan.

## 2014-11-14 LAB — URINALYSIS, ROUTINE W REFLEX MICROSCOPIC
Bilirubin, UA: NEGATIVE
KETONES UA: NEGATIVE
Nitrite, UA: NEGATIVE
PROTEIN UA: NEGATIVE
Urobilinogen, Ur: 0.2 mg/dL (ref 0.2–1.0)
pH, UA: 5.5 (ref 5.0–7.5)

## 2014-11-14 LAB — MICROSCOPIC EXAMINATION
Casts: NONE SEEN /lpf
RBC, UA: >30 /hpf — AB (ref 0–?)

## 2014-11-14 LAB — HIV ANTIBODY (ROUTINE TESTING W REFLEX): HIV SCREEN 4TH GENERATION: NONREACTIVE

## 2014-11-14 NOTE — Assessment & Plan Note (Addendum)
Complaints of vaginal discharge, feeling of pressure in her abdomen with urination. Pt has been treated for UTI with Keflex- Urine culture grew 10 000- 25,000 colonies of Grp B strep, she has also been treated with Fluconazole for vaginl candidiasis, with persistence of symptoms. No fever. Sexually active.  Plan- GC/Chlamydia - Wet prep- Gardnerella, trichomonas and Fungi - Pap smear - Await results-  - UA  Addendum- Pts wet prep results- Positive for trichomonas and Gardnerella. Will treat with 7 days of metronidazole- 500mg  BID. Called patient twice, no response, left voice mail message about results and treatment called to pharmacy, will call patient back to tell her that her partner  Will need treatment for Trichomonas, and she should abstain from sexual intercourse for at least 7 days.

## 2014-11-14 NOTE — Assessment & Plan Note (Signed)
Pap Smear today.

## 2014-11-14 NOTE — Assessment & Plan Note (Signed)
No time to appropriately address this today. She will return to discuss her diabetes with her PCP. BC- ~400 today, clinic closing so was given 5U of novolog to check her blood sugars at home in 1 hr. HgbA1c- >13, On lantus- 45u daily.

## 2014-11-16 LAB — CERVICOVAGINAL ANCILLARY ONLY
Chlamydia: NEGATIVE
Neisseria Gonorrhea: NEGATIVE

## 2014-11-16 NOTE — Addendum Note (Signed)
Addended by: Lalla Brothers T on: 11/16/2014 01:43 PM   Modules accepted: Level of Service, SmartSet

## 2014-11-16 NOTE — Progress Notes (Signed)
Internal Medicine Clinic Attending  Case discussed with Dr. Emokpae soon after the resident saw the patient.  We reviewed the resident's history and exam and pertinent patient test results.  I agree with the assessment, diagnosis, and plan of care documented in the resident's note. 

## 2014-11-17 LAB — CERVICOVAGINAL ANCILLARY ONLY: WET PREP (BD AFFIRM): POSITIVE — AB

## 2014-11-17 MED ORDER — METRONIDAZOLE 500 MG PO TABS: 500.0000 mg | ORAL_TABLET | Freq: Two times a day (BID) | ORAL | Status: DC

## 2014-11-17 NOTE — Addendum Note (Signed)
Addended by: Bethena Roys on: 11/17/2014 09:54 AM   Modules accepted: Orders

## 2014-11-19 LAB — CYTOLOGY - PAP

## 2015-04-13 ENCOUNTER — Other Ambulatory Visit: Payer: Self-pay | Admitting: Internal Medicine

## 2015-04-26 ENCOUNTER — Ambulatory Visit: Payer: Medicaid Other | Admitting: Internal Medicine

## 2015-05-18 ENCOUNTER — Encounter: Payer: Self-pay | Admitting: Internal Medicine

## 2015-05-18 ENCOUNTER — Ambulatory Visit (INDEPENDENT_AMBULATORY_CARE_PROVIDER_SITE_OTHER): Payer: Medicaid Other | Admitting: Internal Medicine

## 2015-05-18 VITALS — BP 132/83 | HR 92 | Temp 97.8°F | Ht 64.0 in | Wt 150.9 lb

## 2015-05-18 DIAGNOSIS — Z8742 Personal history of other diseases of the female genital tract: Secondary | ICD-10-CM | POA: Diagnosis not present

## 2015-05-18 DIAGNOSIS — Z794 Long term (current) use of insulin: Secondary | ICD-10-CM

## 2015-05-18 DIAGNOSIS — E1142 Type 2 diabetes mellitus with diabetic polyneuropathy: Secondary | ICD-10-CM | POA: Diagnosis not present

## 2015-05-18 DIAGNOSIS — Z23 Encounter for immunization: Secondary | ICD-10-CM

## 2015-05-18 DIAGNOSIS — N898 Other specified noninflammatory disorders of vagina: Secondary | ICD-10-CM

## 2015-05-18 DIAGNOSIS — E118 Type 2 diabetes mellitus with unspecified complications: Principal | ICD-10-CM

## 2015-05-18 DIAGNOSIS — E1165 Type 2 diabetes mellitus with hyperglycemia: Secondary | ICD-10-CM | POA: Diagnosis present

## 2015-05-18 DIAGNOSIS — IMO0002 Reserved for concepts with insufficient information to code with codable children: Secondary | ICD-10-CM

## 2015-05-18 DIAGNOSIS — Z0000 Encounter for general adult medical examination without abnormal findings: Secondary | ICD-10-CM

## 2015-05-18 LAB — POCT GLYCOSYLATED HEMOGLOBIN (HGB A1C): Hemoglobin A1C: >14

## 2015-05-18 LAB — GLUCOSE, CAPILLARY: Glucose-Capillary: 365 mg/dL — ABNORMAL HIGH (ref 65–99)

## 2015-05-18 MED ORDER — INSULIN GLARGINE 100 UNIT/ML SOLOSTAR PEN: 45.0000 [IU] | PEN_INJECTOR | Freq: Every day | SUBCUTANEOUS | Status: DC

## 2015-05-18 MED ORDER — INSULIN PEN NEEDLE 31G X 5 MM MISC: Status: DC

## 2015-05-18 MED ORDER — METFORMIN HCL ER 500 MG PO TB24: 500.0000 mg | ORAL_TABLET | Freq: Every day | ORAL | Status: DC

## 2015-05-18 MED ORDER — GABAPENTIN 300 MG PO CAPS: 300.0000 mg | ORAL_CAPSULE | Freq: Every day | ORAL | Status: DC

## 2015-05-18 NOTE — Progress Notes (Signed)
Subjective:   Patient ID: YARITZA DANNER female   DOB: April 10, 1970 45 y.o.   MRN: [id number]  HPI: Ms. FLOSSIE HASTON is a 45 y.o. female w/ PMHx of uncontrolled DM type II w/ peripheral neuropathy, HLD, chronic low back pain, questionable h/o rheumatologic disease, and significant h/o non-compliance, presents to the clinic today for a follow up appointment regarding her very uncontrolled DM type II. Patient is slightly tearful on exam today as she is starting to worry about her elevated blood sugars. She does not check her sugars but can feel that they are regularly high. She only takes her Lantus on occasion. Admits to some intermittent blurry vision, polyneuropathy symptoms, and frequent yeast infections and UTI's. She does state she is interested in improving her glucose control. She also says she saw a dentist recently for some tooth pain and needs to have an extraction, but the dentist won't perform the extraction until her blood sugar is better controlled, according to the patient.   Patient was last seen in 11/2014 at which time she was complaining of vaginal discharge. Vaginal exam was significant for Trich and BV. She completed a full course of Flagyl. No further issues with this.     Past Medical History  Diagnosis Date  . Peripheral neuropathy   . Fibromyalgia   . Episodic recurrent vertigo   . Vitamin D deficiency     history of  . Hyperlipidemia   . Diabetes mellitus   . Chronic rhinitis   . Depression   . Asthma   . Tinnitus     history of, Evaluated by ENT> [place]  . Rheumatoid arthritis(714.0)     seen by Rheumatology in [facility], Rheumatoid Factor pos, Anti CCP pos in 2005 -? early presentation of RF  . Obesity   . History of hip fracture     hx childhood hip fracture    Current Outpatient Prescriptions  Medication Sig Dispense Refill  . albuterol (PROVENTIL HFA;VENTOLIN HFA) 108 (90 BASE) MCG/ACT inhaler Inhale 2 puffs into the lungs every 6 (six) hours as  needed for wheezing or shortness of breath. 1 Inhaler 5  . benzonatate (TESSALON) 100 MG capsule Take 1 capsule (100 mg total) by mouth every 8 (eight) hours. 21 capsule 0  . diclofenac sodium (VOLTAREN) 1 % GEL Apply 2 g topically 4 (four) times daily. (Patient taking differently: Apply 2 g topically 4 (four) times daily as needed (pain). ) 100 g 1  . gabapentin (NEURONTIN) 300 MG capsule Take 1 capsule (300 mg total) by mouth at bedtime. If pain doesn't improve, take 1 more tablet. Do NOT take more than 3 tablets. 30 capsule 2  . glucose blood (ACCU-CHEK AVIVA PLUS) test strip Use to check blood sugar three times daily. ICD10: E11.65 100 each 11  . HYDROcodone-acetaminophen (NORCO/VICODIN) 5-325 MG per tablet Take 1 tablet by mouth every 6 (six) hours as needed. (Patient taking differently: Take 1 tablet by mouth every 6 (six) hours as needed for moderate pain. ) 15 tablet 0  . ibuprofen (ADVIL,MOTRIN) 600 MG tablet Take 1 tablet (600 mg total) by mouth every 8 (eight) hours as needed. (Patient taking differently: Take 600 mg by mouth every 8 (eight) hours as needed for moderate pain. ) 60 tablet 0  . insulin glargine (LANTUS) 100 unit/mL SOPN Inject 0.45 mLs (45 Units total) into the skin daily. 15 mL 3  . loratadine (CLARITIN) 10 MG tablet Take 1 tablet (10 mg total) by mouth daily.  30 tablet 0  . metroNIDAZOLE (FLAGYL) 500 MG tablet Take 1 tablet (500 mg total) by mouth 2 (two) times daily. 14 tablet 0   No current facility-administered medications for this visit.    Review of Systems: General: Denies fever, chills, diaphoresis, appetite change and fatigue.  Respiratory: Denies SOB, DOE, cough, and wheezing.   Cardiovascular: Denies chest pain and palpitations.  Gastrointestinal: Denies nausea, vomiting, abdominal pain, and diarrhea.  Genitourinary: Denies dysuria, increased frequency, and flank pain. Endocrine: Denies hot or cold intolerance, polyuria, and polydipsia. Musculoskeletal:  Positive for low back pain, left ankle/foot pain. Denies myalgias, joint swelling, and gait problem.  Skin: Denies pallor, rash and wounds.  Neurological: Denies dizziness, seizures, syncope, weakness, lightheadedness, numbness and headaches.  Psychiatric/Behavioral: Denies mood changes, and sleep disturbances.   Objective:   Physical Exam: Filed Vitals:   05/18/15 1437  BP: 132/83  Pulse: 92  Temp: 97.8 F (36.6 C)  TempSrc: Oral  Height: 5\' 4"  (1.626 m)  Weight: 150 lb 14.4 oz (68.448 kg)  SpO2: 100%    General: AA female, alert, cooperative, NAD. HEENT: PERRL, EOMI. Moist mucus membranes. Wears color contacts.  Neck: Full range of motion without pain, supple, no lymphadenopathy or carotid bruits Lungs: Clear to ascultation bilaterally, normal work of respiration, no wheezes, rales, rhonchi Heart: RRR, no murmurs, gallops, or rubs Abdomen: Soft, non-tender, non-distended, BS +.   Extremities: No cyanosis, clubbing, or edema.  Neurologic: Alert & oriented X3, cranial nerves II-XII intact, strength grossly intact, sensation intact to light touch   Assessment & Plan:   Please see problem based assessment and plan.

## 2015-05-18 NOTE — Patient Instructions (Signed)
1. Please follow up in 4 weeks.   2. Please take all medications as previously prescribed with the following changes:  Start taking lantus 45 units once at bedtime. I have given you a prescription for this insulin pens which are easier to use. If this is a problem at the pharmacy, please let us know.   Start taking metformin XR 500 mg ONCE daily.   I refilled your gabapentin. Please take this once at night for your neuropathy.   CHECK YOUR BLOOD SUGAR ONCE DAILY.   3. If you have worsening of your symptoms or new symptoms arise, please call the clinic [phone]), or go to the ER immediately if symptoms are severe.

## 2015-05-19 LAB — BMP8+ANION GAP
ANION GAP: 20 mmol/L — AB (ref 10.0–18.0)
BUN/Creatinine Ratio: 14 (ref 9–23)
BUN: 9 mg/dL (ref 6–24)
CO2: 20 mmol/L (ref 18–29)
Calcium: 9.3 mg/dL (ref 8.7–10.2)
Chloride: 92 mmol/L — ABNORMAL LOW (ref 96–106)
Creatinine, Ser: 0.65 mg/dL (ref 0.57–1.00)
GFR calc non Af Amer: 108 mL/min/{1.73_m2} (ref >59)
GFR, EST AFRICAN AMERICAN: 124 mL/min/{1.73_m2} (ref >59)
Glucose: 338 mg/dL — ABNORMAL HIGH (ref 65–99)
POTASSIUM: 4.1 mmol/L (ref 3.5–5.2)
SODIUM: 132 mmol/L — AB (ref 134–144)

## 2015-05-19 LAB — LIPID PANEL
CHOL/HDL RATIO: 4.6 ratio — AB (ref 0.0–4.4)
Cholesterol, Total: 208 mg/dL — ABNORMAL HIGH (ref 100–199)
HDL: 45 mg/dL (ref >39)
LDL CALC: 138 mg/dL — AB (ref 0–99)
Triglycerides: 126 mg/dL (ref 0–149)
VLDL Cholesterol Cal: 25 mg/dL (ref 5–40)

## 2015-05-19 LAB — MICROALBUMIN / CREATININE URINE RATIO
Creatinine, Urine: 34.9 mg/dL
MICROALB/CREAT RATIO: 210.3 mg/g creat — ABNORMAL HIGH (ref 0.0–30.0)
MICROALBUM., U, RANDOM: 73.4 ug/mL

## 2015-05-19 NOTE — Assessment & Plan Note (Signed)
Previously treated for BV + Trich. Discharge has resolved.

## 2015-05-19 NOTE — Assessment & Plan Note (Signed)
Lab Results  Component Value Date   HGBA1C >14.0 05/18/2015   HGBA1C 13.7 11/13/2014   HGBA1C 11.5 07/24/2014     Assessment: Diabetes control:  Very poorly controlled Comments: Patient has a long history of poor compliance with her diabetes medications and Lantus insulin. Only comes to the clinic on occasion (~6-8 months) and does not check her blood sugars. Has not been using her Lantus regularly. Today she is tearful on exam because she feels her high blood sugars are interfering with her lifestyle. She complains of blurry vision, constant bilateral foot pain, frequent yeast infections and UIT's in the past. She also needs to have a tooth extraction but the dentist won't perform this until her blood sugars are better controlled.   Plan: Medications:  Refilled Lantus pens; will continue to use 45 units qhs. Says she will do this regularly. Will start Metformin XR 500 mg once daily. Has had issues with nausea with regular formulation in the past, but says she will take this. Feel the extended release is frequently better tolerated. She preferred this option over taking Glipizide. Asked about three times daily insulin injections, however, in no way think that she will be compliant with this as she is hardly able to manage a once daily injection.  Home glucose monitoring: Frequency:  Advised ONLY once daily CBG checks for now (cannot manage more than this) Timing:  AM fasting Instruction/counseling given: reminded to bring blood glucose meter & log to each visit, reminded to bring medications to each visit, discussed foot care, discussed the need for weight loss and discussed diet Other plans: Had long discussion about importance of keeping blood sugar under control. Explained possible complications of diabetes. She is somewhat tearful on exam and seems to want to be better at taking control of her diabetes. Cr normal. Checked urine microalbumin. Will need an eye exam in the future. Needs follow up  in no more than 1 month.

## 2015-05-19 NOTE — Assessment & Plan Note (Signed)
Refilled Neurontin to take 300 mg qhs for now. Has not been taking this but is having neuropathy symptoms.  -Can potentially increase dose at next visit if needed

## 2015-05-19 NOTE — Assessment & Plan Note (Addendum)
Checked BMP, lipid panel, urine microalbumin today. Also given flu shot

## 2015-05-21 NOTE — Progress Notes (Signed)
Internal Medicine Clinic Attending  Case discussed with Dr. Jones soon after the resident saw the patient.  We reviewed the resident's history and exam and pertinent patient test results.  I agree with the assessment, diagnosis, and plan of care documented in the resident's note. 

## 2015-07-06 ENCOUNTER — Encounter: Payer: Self-pay | Admitting: Internal Medicine

## 2015-07-06 ENCOUNTER — Ambulatory Visit (INDEPENDENT_AMBULATORY_CARE_PROVIDER_SITE_OTHER): Payer: Self-pay | Admitting: Internal Medicine

## 2015-07-06 VITALS — BP 120/74 | HR 88 | Temp 98.0°F | Ht 64.0 in | Wt 152.6 lb

## 2015-07-06 DIAGNOSIS — E1142 Type 2 diabetes mellitus with diabetic polyneuropathy: Secondary | ICD-10-CM

## 2015-07-06 DIAGNOSIS — Z0000 Encounter for general adult medical examination without abnormal findings: Secondary | ICD-10-CM

## 2015-07-06 DIAGNOSIS — E1165 Type 2 diabetes mellitus with hyperglycemia: Secondary | ICD-10-CM

## 2015-07-06 DIAGNOSIS — E118 Type 2 diabetes mellitus with unspecified complications: Secondary | ICD-10-CM

## 2015-07-06 DIAGNOSIS — Z794 Long term (current) use of insulin: Secondary | ICD-10-CM

## 2015-07-06 DIAGNOSIS — IMO0002 Reserved for concepts with insufficient information to code with codable children: Secondary | ICD-10-CM

## 2015-07-06 LAB — GLUCOSE, CAPILLARY: GLUCOSE-CAPILLARY: 303 mg/dL — AB (ref 65–99)

## 2015-07-06 MED ORDER — GABAPENTIN 300 MG PO CAPS: 600.0000 mg | ORAL_CAPSULE | Freq: Every day | ORAL | Status: DC

## 2015-07-06 MED ORDER — METFORMIN HCL ER 500 MG PO TB24: 1000.0000 mg | ORAL_TABLET | Freq: Every day | ORAL | Status: DC

## 2015-07-06 NOTE — Assessment & Plan Note (Addendum)
Foot exam today. Is scheduled also for eye exam.

## 2015-07-06 NOTE — Patient Instructions (Signed)
1. Please make a follow up appointment for 6 weeks.   2. Please take all medications as previously prescribed with the following changes:  Increase Metformin to 1000 mg daily. DO NOT MISS DOSES.   Increase Gabapentin to 600 mg once at night. This will help with your neuropathy.  3. If you have worsening of your symptoms or new symptoms arise, please call the clinic [phone]), or go to the ER immediately if symptoms are severe.

## 2015-07-06 NOTE — Assessment & Plan Note (Signed)
Lab Results  Component Value Date   HGBA1C >14.0 05/18/2015   HGBA1C 13.7 11/13/2014   HGBA1C 11.5 07/24/2014     Assessment: Diabetes control:  Poor control Progress toward A1C goal:   Slow progress Comments: Patient feels she is improving slightly. Tolerating Metformin 500 mg daily. Also using her Lantus 45 units qhs. No symptoms of hypoglycemia. Average CBG's still in the 300's.   Plan: Medications:  Increase Metformin XR to 1000 mg daily. Continue Lantus.  Home glucose monitoring: Frequency:  Once daily Instruction/counseling given: reminded to get eye exam, reminded to bring blood glucose meter & log to each visit, reminded to bring medications to each visit, discussed foot care, discussed the need for weight loss and discussed diet Educational resources provided: brochure (denies ) Self management tools provided: copy of home glucose meter download Other plans: RTC in 6 weeks.

## 2015-07-06 NOTE — Progress Notes (Signed)
Subjective:   [name] female   DOB: [birth year] 45 y.o.   MRN: [id number]  HPI: [name] is a 45 y.o. female w/ PMHx of uncontrolled DM type II w/ peripheral neuropathy, HLD, chronic low back pain, questionable h/o rheumatologic disease, and significant h/o non-compliance, presents to the clinic today for a follow up appointment regarding her very uncontrolled DM type II. Patient seems to be doing well today, feels her blood sugar has been more controlled lately, although according to her meter, still has average around 300. Tolerating Metformin 500 mg daily. Still taking her Lantus 45 mg qhs. No symptoms of hypoglycemia. Still complaining of neuropathic pain, says the gabapentin is helping.    Current Outpatient Prescriptions  Medication Sig Dispense Refill  . albuterol (PROVENTIL HFA;VENTOLIN HFA) 108 (90 BASE) MCG/ACT inhaler Inhale 2 puffs into the lungs every 6 (six) hours as needed for wheezing or shortness of breath. 1 Inhaler 5  . benzonatate (TESSALON) 100 MG capsule Take 1 capsule (100 mg total) by mouth every 8 (eight) hours. 21 capsule 0  . diclofenac sodium (VOLTAREN) 1 % GEL Apply 2 g topically 4 (four) times daily. (Patient taking differently: Apply 2 g topically 4 (four) times daily as needed (pain). ) 100 g 1  . gabapentin (NEURONTIN) 300 MG capsule Take 1 capsule (300 mg total) by mouth at bedtime. 30 capsule 5  . glucose blood (ACCU-CHEK AVIVA PLUS) test strip Use to check blood sugar three times daily. ICD10: E11.65 100 each 11  . ibuprofen (ADVIL,MOTRIN) 600 MG tablet Take 1 tablet (600 mg total) by mouth every 8 (eight) hours as needed. (Patient taking differently: Take 600 mg by mouth every 8 (eight) hours as needed for moderate pain. ) 60 tablet 0  . Insulin Glargine (LANTUS SOLOSTAR) 100 UNIT/ML Solostar Pen Inject 45 Units into the skin daily at 10 pm. 15 mL 11  . Insulin Pen Needle 31G X 5 MM MISC Please inject insulin 1 time daily. ICD10:  E11.65 100 each 11  . loratadine (CLARITIN) 10 MG tablet Take 1 tablet (10 mg total) by mouth daily. 30 tablet 0  . metFORMIN (GLUCOPHAGE XR) 500 MG 24 hr tablet Take 1 tablet (500 mg total) by mouth daily with breakfast. 30 tablet 5  . metroNIDAZOLE (FLAGYL) 500 MG tablet Take 1 tablet (500 mg total) by mouth 2 (two) times daily. 14 tablet 0   No current facility-administered medications for this visit.    Review of Systems: General: Denies fever, chills, diaphoresis, appetite change and fatigue.  Respiratory: Denies SOB, DOE, cough, and wheezing.   Cardiovascular: Denies chest pain and palpitations.  Gastrointestinal: Denies nausea, vomiting, abdominal pain, and diarrhea.  Genitourinary: Denies dysuria, increased frequency, and flank pain. Endocrine: Denies hot or cold intolerance, polyuria, and polydipsia. Musculoskeletal: Positive for bilateral foot pain. Denies myalgias, joint swelling, and gait problem.  Skin: Denies pallor, rash and wounds.  Neurological: Denies dizziness, seizures, syncope, weakness, lightheadedness, numbness and headaches.  Psychiatric/Behavioral: Denies mood changes, and sleep disturbances.   Objective:   Physical Exam: Filed Vitals:   07/06/15 1342  BP: 120/74  Pulse: 88  Temp: 98 F (36.7 C)  TempSrc: Oral  Height: 5\' 4"  (1.626 m)  Weight: 152 lb 9.6 oz (69.219 kg)  SpO2: 100%    General: AA female, alert, cooperative, NAD. HEENT: PERRL, EOMI. Moist mucus membranes. Wears color contacts.  Neck: Full range of motion without pain, supple, no lymphadenopathy or carotid bruits Lungs:  Clear to ascultation bilaterally, normal work of respiration, no wheezes, rales, rhonchi Heart: RRR, no murmurs, gallops, or rubs Abdomen: Soft, non-tender, non-distended, BS +.   Extremities: No cyanosis, clubbing, or edema.  Neurologic: Alert & oriented X3, cranial nerves II-XII intact, strength grossly intact, sensation intact to light touch   Assessment & Plan:     Please see problem based assessment and plan.

## 2015-07-06 NOTE — Assessment & Plan Note (Signed)
Still with bilateral neuropathic pain in the feet. Migrates up into her legs. Says the Neurontin helps. Does not want to take medication three times daily. -Increase Neurontin to 600 mg qhs

## 2015-07-07 NOTE — Progress Notes (Signed)
Internal Medicine Clinic Attending  Case discussed with Dr. Jones at the time of the visit.  We reviewed the resident's history and exam and pertinent patient test results.  I agree with the assessment, diagnosis, and plan of care documented in the resident's note.  

## 2015-08-16 ENCOUNTER — Encounter: Payer: Self-pay | Admitting: *Deleted

## 2015-09-17 ENCOUNTER — Ambulatory Visit (HOSPITAL_COMMUNITY)
Admission: EM | Admit: 2015-09-17 | Discharge: 2015-09-17 | Disposition: A | Discharge status: Home / Self Care | Payer: Medicaid Other | Attending: Emergency Medicine | Admitting: Emergency Medicine

## 2015-09-17 ENCOUNTER — Encounter (HOSPITAL_COMMUNITY): Payer: Self-pay | Admitting: Family Medicine

## 2015-09-17 DIAGNOSIS — K0889 Other specified disorders of teeth and supporting structures: Secondary | ICD-10-CM

## 2015-09-17 MED ORDER — AMOXICILLIN 500 MG PO CAPS: 1000.0000 mg | ORAL_CAPSULE | Freq: Two times a day (BID) | ORAL | Status: DC

## 2015-09-17 MED ORDER — HYDROCODONE-ACETAMINOPHEN 5-325 MG PO TABS: 1.0000 | ORAL_TABLET | ORAL | Status: DC | PRN

## 2015-09-17 NOTE — ED Notes (Signed)
Pt here for right upper dental pain. sts that she is due to see a surgeon for tooth removal and needs something for the pain to get through next week.

## 2015-09-17 NOTE — ED Provider Notes (Signed)
CSN: [id number]     Arrival date & time 09/17/15  1750 History   First MD Initiated Contact with Patient 09/17/15 1952     Chief Complaint  Patient presents with  . Dental Pain   (Consider location/radiation/quality/duration/timing/severity/associated sxs/prior Treatment) HPI Comments: 45 year old female complaining of a toothache. Tooth is the upper right third molar. His been hurting for 2 weeks. She has a history of pain in the same tooth where she saw a dentist a few months ago. She was advised to have it pulled but when she went to the oral surgeon he was unable to perform the procedure due to her elevated blood sugar. She has an appointment in about a week for the extraction but she is requesting something for pain.   Past Medical History  Diagnosis Date  . Peripheral neuropathy ([place])   . Fibromyalgia   . Episodic recurrent vertigo   . Vitamin D deficiency     history of  . Hyperlipidemia   . Diabetes mellitus   . Chronic rhinitis   . Depression   . Asthma   . Tinnitus     history of, Evaluated by ENT> [place]  . Rheumatoid arthritis(714.0)     seen by Rheumatology in [facility], Rheumatoid Factor pos, Anti CCP pos in 2005 -? early presentation of RF  . Obesity   . History of hip fracture     hx childhood hip fracture   Past Surgical History  Procedure Laterality Date  . Cesarean section     Family History  Problem Relation Age of Onset  . Hypertension Mother   . Diabetes Maternal Grandmother   . Arthritis Maternal Grandmother   . Kidney disease Maternal Grandmother    Social History  Substance Use Topics  . Smoking status: Never Smoker   . Smokeless tobacco: None  . Alcohol Use: 0.0 oz/week    0 Standard drinks or equivalent per week     Comment: occasionally   OB History    Gravida Para Term Preterm AB TAB SAB Ectopic Multiple Living   6 6 6      1 6      Review of Systems  Constitutional: Negative.   HENT: Positive for dental problem.   Respiratory:  Negative.   Neurological: Negative.   All other systems reviewed and are negative.   Allergies  Review of patient's allergies indicates no active allergies.  Home Medications   Prior to Admission medications   Medication Sig Start Date End Date Taking? Authorizing Provider  albuterol (PROVENTIL HFA;VENTOLIN HFA) 108 (90 BASE) MCG/ACT inhaler Inhale 2 puffs into the lungs every 6 (six) hours as needed for wheezing or shortness of breath. 10/14/13   Corky Sox, MD  amoxicillin (AMOXIL) 500 MG capsule Take 2 capsules (1,000 mg total) by mouth 2 (two) times daily. 09/17/15   Janne Napoleon, NP  diclofenac sodium (VOLTAREN) 1 % GEL Apply 2 g topically 4 (four) times daily. Patient taking differently: Apply 2 g topically 4 (four) times daily as needed (pain).  02/24/14   Corky Sox, MD  gabapentin (NEURONTIN) 300 MG capsule Take 2 capsules (600 mg total) by mouth at bedtime. 07/06/15 07/05/16  Corky Sox, MD  glucose blood (ACCU-CHEK AVIVA PLUS) test strip Use to check blood sugar three times daily. ICD10: E11.65 04/15/15   Corky Sox, MD  HYDROcodone-acetaminophen (NORCO/VICODIN) 5-325 MG tablet Take 1 tablet by mouth every 4 (four) hours as needed. 09/17/15   Janne Napoleon, NP  Insulin Glargine (LANTUS SOLOSTAR) 100 UNIT/ML Solostar Pen Inject 45 Units into the skin daily at 10 pm. 05/18/15   Corky Sox, MD  Insulin Pen Needle 31G X 5 MM MISC Please inject insulin 1 time daily. ICD10: E11.65 05/18/15   Corky Sox, MD  loratadine (CLARITIN) 10 MG tablet Take 1 tablet (10 mg total) by mouth daily. 05/26/14   Julianne Rice, MD  metFORMIN (GLUCOPHAGE XR) 500 MG 24 hr tablet Take 2 tablets (1,000 mg total) by mouth daily with breakfast. 07/06/15   Corky Sox, MD   Meds Ordered and Administered this Visit  Medications - No data to display  BP 130/77 mmHg  Pulse 88  Temp(Src) 98.6 F (37 C)  Resp 18  SpO2 100%  LMP 09/01/2015 No data found.   Physical Exam  Constitutional: She is oriented to  person, place, and time. She appears well-developed and well-nourished. No distress.  HENT:  Head: Normocephalic and atraumatic.  Mouth/Throat: Oropharynx is clear and moist.  Right upper third molar with dental tenderness. No surrounding gingival swelling or erythema. No evidence of infection or abscess.  Eyes: Conjunctivae and EOM are normal.  Neck: Normal range of motion. Neck supple.  Cardiovascular: Normal rate.   Pulmonary/Chest: Effort normal.  Lymphadenopathy:    She has no cervical adenopathy.  Neurological: She is alert and oriented to person, place, and time.  Skin: Skin is warm and dry.  Psychiatric: She has a normal mood and affect.  Nursing note and vitals reviewed.   ED Course  Procedures (including critical care time)  Labs Review Labs Reviewed - No data to display  Imaging Review No results found.   Visual Acuity Review  Right Eye Distance:   Left Eye Distance:   Bilateral Distance:    Right Eye Near:   Left Eye Near:    Bilateral Near:         MDM   1. Pain, dental    Meds ordered this encounter  Medications  . HYDROcodone-acetaminophen (NORCO/VICODIN) 5-325 MG tablet    Sig: Take 1 tablet by mouth every 4 (four) hours as needed.    Dispense:  15 tablet    Refill:  0    Order Specific Question:  Supervising Provider    Answer:  Melony Overly [id number]  . amoxicillin (AMOXIL) 500 MG capsule    Sig: Take 2 capsules (1,000 mg total) by mouth 2 (two) times daily.    Dispense:  30 capsule    Refill:  0    Order Specific Question:  Supervising Provider    Answer:  Melony Overly [id number]   Follow with oral surgeon next week as scheduled.    Janne Napoleon, NP 09/17/15 2015

## 2015-10-11 ENCOUNTER — Ambulatory Visit (HOSPITAL_COMMUNITY)
Admission: EM | Admit: 2015-10-11 | Discharge: 2015-10-11 | Disposition: A | Discharge status: Home / Self Care | Payer: Medicaid Other | Attending: Emergency Medicine | Admitting: Emergency Medicine

## 2015-10-11 ENCOUNTER — Encounter (HOSPITAL_COMMUNITY): Payer: Self-pay | Admitting: Emergency Medicine

## 2015-10-11 ENCOUNTER — Ambulatory Visit (INDEPENDENT_AMBULATORY_CARE_PROVIDER_SITE_OTHER): Payer: Medicaid Other

## 2015-10-11 DIAGNOSIS — S63502A Unspecified sprain of left wrist, initial encounter: Secondary | ICD-10-CM

## 2015-10-11 MED ORDER — IBUPROFEN 800 MG PO TABS
ORAL_TABLET | ORAL | Status: AC
  Filled 2015-10-11: qty 1

## 2015-10-11 MED ORDER — IBUPROFEN 800 MG PO TABS
800.0000 mg | ORAL_TABLET | Freq: Once | ORAL | Status: AC
  Administered 2015-10-11: 800 mg via ORAL

## 2015-10-11 MED ORDER — TRAMADOL HCL 50 MG PO TABS: 50.0000 mg | ORAL_TABLET | Freq: Four times a day (QID) | ORAL | 0 refills | Status: DC | PRN

## 2015-10-11 MED ORDER — MELOXICAM 15 MG PO TABS: 15.0000 mg | ORAL_TABLET | Freq: Every day | ORAL | 0 refills | Status: DC

## 2015-10-11 NOTE — Discharge Instructions (Signed)
You have a wrist sprain. X-ray is normal. Wear the wrist brace for the next 4-5 days. Remove it several times a day to apply ice and do gentle range of motion. Take meloxicam daily for the next 5 days, then as needed for pain. Do not take ibuprofen or Advil with this medicine. Use the tramadol every 6-8 hours as needed for severe pain. Do not drive while taking this medicine. Follow-up as needed.

## 2015-10-11 NOTE — ED Triage Notes (Signed)
The patient presented to the [facility] with a complaint of left wrist pain that started yesterday after lifting a box of sodas.

## 2015-10-11 NOTE — ED Provider Notes (Signed)
[place]    CSN: [id number] Arrival date & time: 10/11/15  1201  First Provider Contact:  First MD Initiated Contact with Patient 10/11/15 1259        History   Chief Complaint Chief Complaint  Patient presents with  . Wrist Pain    HPI Adriana Burke is a 45 y.o. female.   She is a 45 year old woman here for evaluation of left wrist injury. She states that yesterday she was picking up a case of soda in the grocery store and when she put it in the cart she heard a popping/cracking noise in the left wrist. Later that evening she was caring for her autistic son when he jerked away, again injuring the left wrist. She reports pain primarily in the ulnar wrist. It is worse with wrist movement and gripping. She reports very minimal swelling on the ulnar side of the wrist. She has not tried any medications or ice.    Past Medical History:  Diagnosis Date  . Asthma   . Chronic rhinitis   . Depression   . Diabetes mellitus   . Episodic recurrent vertigo   . Fibromyalgia   . History of hip fracture    hx childhood hip fracture  . Hyperlipidemia   . Obesity   . Peripheral neuropathy ([place])   . Rheumatoid arthritis(714.0)    seen by Rheumatology in [facility], Rheumatoid Factor pos, Anti CCP pos in 2005 -? early presentation of RF  . Tinnitus    history of, Evaluated by ENT> [place]  . Vitamin D deficiency    history of    Patient Active Problem List   Diagnosis Date Noted  . Healthcare maintenance 02/24/2014  . Fibrocystic changes of right breast 12/03/2013  . Bilateral hip pain 10/14/2013  . Alopecia areata 09/16/2013  . Impaired hearing rt ear 01/02/2013  . Abnormality of gait 06/12/2012  . Osteoarthritis of left hip 05/31/2011  . Chronic generalized pain 04/01/2011  . Diabetic polyneuropathy ([place]) 02/24/2010  . FIBROMYALGIA 10/29/2009  . WRIST PAIN, BILATERAL 11/16/2008  . VITAMIN D DEFICIENCY 07/24/2008  . RHEUMATOID ARTHRITIS 04/17/2008  .  HYPERLIPIDEMIA 04/08/2008  . Type II diabetes mellitus with manifestations, uncontrolled ([place]) 01/08/2008  . KNEE PAIN, BILATERAL 08/23/2006  . DEPRESSION 04/10/2006  . ASTHMA 01/08/2006    Past Surgical History:  Procedure Laterality Date  . CESAREAN SECTION      OB History    Gravida Para Term Preterm AB Living   6 6 6     6    SAB TAB Ectopic Multiple Live Births         1         Home Medications    Prior to Admission medications   Medication Sig Start Date End Date Taking? Authorizing Provider  albuterol (PROVENTIL HFA;VENTOLIN HFA) 108 (90 BASE) MCG/ACT inhaler Inhale 2 puffs into the lungs every 6 (six) hours as needed for wheezing or shortness of breath. 10/14/13  Yes Corky Sox, MD  Insulin Glargine (LANTUS SOLOSTAR) 100 UNIT/ML Solostar Pen Inject 45 Units into the skin daily at 10 pm. 05/18/15  Yes Corky Sox, MD  amoxicillin (AMOXIL) 500 MG capsule Take 2 capsules (1,000 mg total) by mouth 2 (two) times daily. 09/17/15   Janne Napoleon, NP  diclofenac sodium (VOLTAREN) 1 % GEL Apply 2 g topically 4 (four) times daily. Patient taking differently: Apply 2 g topically 4 (four) times daily as needed (pain).  02/24/14   Ernst Bowler  Ronnald Ramp, MD  gabapentin (NEURONTIN) 300 MG capsule Take 2 capsules (600 mg total) by mouth at bedtime. 07/06/15 07/05/16  Corky Sox, MD  glucose blood (ACCU-CHEK AVIVA PLUS) test strip Use to check blood sugar three times daily. ICD10: E11.65 04/15/15   Corky Sox, MD  HYDROcodone-acetaminophen (NORCO/VICODIN) 5-325 MG tablet Take 1 tablet by mouth every 4 (four) hours as needed. 09/17/15   Janne Napoleon, NP  Insulin Pen Needle 31G X 5 MM MISC Please inject insulin 1 time daily. ICD10: E11.65 05/18/15   Corky Sox, MD  loratadine (CLARITIN) 10 MG tablet Take 1 tablet (10 mg total) by mouth daily. 05/26/14   Julianne Rice, MD  meloxicam (MOBIC) 15 MG tablet Take 1 tablet (15 mg total) by mouth daily. 10/11/15   Melony Overly, MD  metFORMIN (GLUCOPHAGE XR) 500 MG  24 hr tablet Take 2 tablets (1,000 mg total) by mouth daily with breakfast. 07/06/15   Corky Sox, MD  traMADol (ULTRAM) 50 MG tablet Take 1 tablet (50 mg total) by mouth every 6 (six) hours as needed. 10/11/15   Melony Overly, MD    Family History Family History  Problem Relation Age of Onset  . Hypertension Mother   . Diabetes Maternal Grandmother   . Arthritis Maternal Grandmother   . Kidney disease Maternal Grandmother     Social History Social History  Substance Use Topics  . Smoking status: Never Smoker  . Smokeless tobacco: Not on file  . Alcohol use 0.0 oz/week     Comment: occasionally     Allergies   Review of patient's allergies indicates no known allergies.   Review of Systems Review of Systems  Musculoskeletal:       Left wrist pain     Physical Exam Triage Vital Signs ED Triage Vitals  Enc Vitals Group     BP 10/11/15 1238 120/75     Pulse Rate 10/11/15 1238 93     Resp 10/11/15 1238 12     Temp 10/11/15 1238 98.6 F (37 C)     Temp Source 10/11/15 1238 Oral     SpO2 10/11/15 1238 100 %     Weight --      Height --      Head Circumference --      Peak Flow --      Pain Score 10/11/15 1258 10     Pain Loc --      Pain Edu? --      Excl. in [place]? --    No data found.   Updated Vital Signs BP 120/75 (BP Location: Right Arm)   Pulse 93   Temp 98.6 F (37 C) (Oral)   Resp 12   LMP 10/01/2015 (Approximate)   SpO2 100%   Visual Acuity Right Eye Distance:   Left Eye Distance:   Bilateral Distance:    Right Eye Near:   Left Eye Near:    Bilateral Near:     Physical Exam  Constitutional: She is oriented to person, place, and time. She appears well-developed and well-nourished. No distress.  Cardiovascular: Normal rate.   Pulmonary/Chest: Effort normal.  Musculoskeletal:  Left wrist: No obvious swelling. No erythema or bruising. She is tender over the ulnar wrist and less so the ulnar styloid. Pain with passive wrist flexion and radial  deviation. 2+ radial pulse. Brisk cap refill in all digits. She does have pain with grip strength testing.  Neurological: She is alert and oriented to  person, place, and time.     UC Treatments / Results  Labs (all labs ordered are listed, but only abnormal results are displayed) Labs Reviewed - No data to display  EKG  EKG Interpretation None       Radiology Dg Wrist Complete Left  Result Date: 10/11/2015 CLINICAL DATA:  The patient felt a pop in the left wrist today with picking up a case of soda. Pain. Initial encounter. EXAM: LEFT WRIST - COMPLETE 3+ VIEW COMPARISON:  None. FINDINGS: There is no evidence of fracture or dislocation. There is no evidence of arthropathy or other focal bone abnormality. Soft tissues are unremarkable. IMPRESSION: Negative exam. Electronically Signed   By: Inge Rise M.D.   On: 10/11/2015 13:18    Procedures Procedures (including critical care time)  Medications Ordered in UC Medications  ibuprofen (ADVIL,MOTRIN) tablet 800 mg (800 mg Oral Given 10/11/15 1333)     Initial Impression / Assessment and Plan / UC Course  I have reviewed the triage vital signs and the nursing notes.  Pertinent labs & imaging results that were available during my care of the patient were reviewed by me and considered in my medical decision making (see chart for details).  Clinical Course    X-ray negative for fracture. Conservative management with wrist brace, meloxicam, and ice. Prescription for tramadol provided to use as needed for severe pain. Follow-up as needed.  Final Clinical Impressions(s) / UC Diagnoses   Final diagnoses:  Left wrist sprain, initial encounter    New Prescriptions Discharge Medication List as of 10/11/2015  1:26 PM    START taking these medications   Details  meloxicam (MOBIC) 15 MG tablet Take 1 tablet (15 mg total) by mouth daily., Starting Mon 10/11/2015, Normal    traMADol (ULTRAM) 50 MG tablet Take 1 tablet (50 mg total)  by mouth every 6 (six) hours as needed., Starting Mon 10/11/2015, Normal         Melony Overly, MD 10/11/15 1339

## 2015-10-14 ENCOUNTER — Other Ambulatory Visit: Payer: Self-pay

## 2015-10-14 NOTE — Telephone Encounter (Signed)
Adriana Burke from [facility] requesting to speak with a nurse regarding med. Please call pt back.

## 2015-10-15 NOTE — Telephone Encounter (Signed)
Pharmacy unable to figure out question. Adriana Burke not available today.

## 2015-12-13 ENCOUNTER — Telehealth: Payer: Self-pay | Admitting: Internal Medicine

## 2015-12-13 NOTE — Telephone Encounter (Signed)
APT. REMINDER CALL, LMTCB °

## 2015-12-14 ENCOUNTER — Ambulatory Visit: Payer: Medicaid Other

## 2016-01-25 ENCOUNTER — Ambulatory Visit: Payer: Medicaid Other

## 2016-02-02 ENCOUNTER — Encounter: Payer: Self-pay | Admitting: Internal Medicine

## 2016-02-02 ENCOUNTER — Ambulatory Visit (INDEPENDENT_AMBULATORY_CARE_PROVIDER_SITE_OTHER): Payer: Medicaid Other | Admitting: Internal Medicine

## 2016-02-02 VITALS — BP 117/79 | HR 84 | Temp 98.5°F | Wt 154.1 lb

## 2016-02-02 DIAGNOSIS — Z79899 Other long term (current) drug therapy: Secondary | ICD-10-CM

## 2016-02-02 DIAGNOSIS — Z794 Long term (current) use of insulin: Secondary | ICD-10-CM

## 2016-02-02 DIAGNOSIS — G8929 Other chronic pain: Secondary | ICD-10-CM | POA: Diagnosis not present

## 2016-02-02 DIAGNOSIS — E1142 Type 2 diabetes mellitus with diabetic polyneuropathy: Secondary | ICD-10-CM

## 2016-02-02 DIAGNOSIS — E1165 Type 2 diabetes mellitus with hyperglycemia: Secondary | ICD-10-CM | POA: Diagnosis not present

## 2016-02-02 DIAGNOSIS — IMO0002 Reserved for concepts with insufficient information to code with codable children: Secondary | ICD-10-CM

## 2016-02-02 DIAGNOSIS — R52 Pain, unspecified: Principal | ICD-10-CM

## 2016-02-02 DIAGNOSIS — E118 Type 2 diabetes mellitus with unspecified complications: Secondary | ICD-10-CM

## 2016-02-02 MED ORDER — SITAGLIPTIN PHOSPHATE 100 MG PO TABS: 100.0000 mg | ORAL_TABLET | Freq: Every day | ORAL | 5 refills | Status: DC

## 2016-02-02 MED ORDER — INSULIN GLARGINE 100 UNIT/ML SOLOSTAR PEN: 50.0000 [IU] | PEN_INJECTOR | Freq: Every day | SUBCUTANEOUS | 11 refills | Status: DC

## 2016-02-02 MED ORDER — IBUPROFEN 600 MG PO TABS
600.0000 mg | ORAL_TABLET | Freq: Four times a day (QID) | ORAL | 0 refills | Status: DC | PRN
Start: 2016-02-02 — End: 2016-04-16

## 2016-02-02 NOTE — Progress Notes (Signed)
   CC: "Burning from head to toes"  HPI:  [name] is a 45 y.o. woman with PMHx as noted below who presents today for evaluation of a burning sensation that extends from her head to her toes.  Reports the burning sensation has been ongoing for several weeks. She describes feeling "on fire" from her feet all the way to her head. She notes an intense headache that is located on the top of head. She states pain is 10/10 in severity, sharp, and with no associated light or sound sensitivity. She has not tried any OTC medications to relieve the pain. She reports she usually receives medication for her headaches here in the clinic, either ibuprofen or hydrocodone which she is requesting. She states her blood sugars have been better since she self-increased her Lantus to 50 units at bedtime from 45 units. Her last A1c was >14. She describes typically having blood sugars in the 400s-500s but after this medication adjustment blood sugars are in the 120s-200s in the morning and steadily increase throughout the day. Evening blood sugars are in the 300s-400s per her report. She did not bring her glucometer today. She takes Neurontin 600 mg BID which helps the burning pain in her feet, especially at night.  Past Medical History:  Diagnosis Date  . Asthma   . Chronic rhinitis   . Depression   . Diabetes mellitus   . Episodic recurrent vertigo   . Fibromyalgia   . History of hip fracture    hx childhood hip fracture  . Hyperlipidemia   . Obesity   . Peripheral neuropathy ([place])   . Rheumatoid arthritis(714.0)    seen by Rheumatology in [facility], Rheumatoid Factor pos, Anti CCP pos in 2005 -? early presentation of RF  . Tinnitus    history of, Evaluated by ENT> [place]  . Vitamin D deficiency    history of    Review of Systems:  All negative except per HPI  Physical Exam:  Vitals:   02/02/16 1613  BP: 117/79  Pulse: 84  Temp: 98.5 F (36.9 C)  TempSrc: Oral  SpO2: 100%  Weight:  154 lb 1.6 oz (69.9 kg)   General: alert, cooperative, NAD HEENT: [place]/AT, EOMI, sclera anicteric, mucus membranes moist CV: RRR, no m/g/r Pulm: CTA bilaterally, breaths non-labored Ext: warm, no peripheral edema, full ROM Neuro: alert and oriented x 3, gait normal  Assessment & Plan:   See Encounters Tab for problem based charting.  Patient discussed with Dr. Angelia Mould

## 2016-02-02 NOTE — Patient Instructions (Signed)
General Instructions: - Continue Lantus 50 units daily at bedtime - Please bring your glucometer to your next visit in 2 weeks - Start Sitagliptin 100 mg daily for your diabetes - You can take ibuprofen 600 mg every 6-8 hours as needed for your headache  Please bring your medicines with you each time you come to clinic.  Medicines may include prescription medications, over-the-counter medications, herbal remedies, eye drops, vitamins, or other pills.   Progress Toward Treatment Goals:  Treatment Goal 02/24/2014  Hemoglobin A1C unchanged  Prevent falls at goal    Self Care Goals & Plans:  Self Care Goal 07/06/2015  Manage my medications take my medicines as prescribed; bring my medications to every visit; refill my medications on time  Monitor my health keep track of my blood glucose; bring my glucose meter and log to each visit  Eat healthy foods drink diet soda or water instead of juice or soda; eat more vegetables; eat foods that are low in salt; eat baked foods instead of fried foods; eat fruit for snacks and desserts  Be physically active -  Prevent falls wear appropriate shoes  Meeting treatment goals maintain the current self-care plan    Home Blood Glucose Monitoring 02/24/2014  Check my blood sugar no home glucose monitoring  When to check my blood sugar N/A     Care Management & Community Referrals:  Referral 02/24/2014  Referrals made for care management support -  Referrals made to community resources none

## 2016-02-04 NOTE — Assessment & Plan Note (Signed)
Her diffuse pain is a chronic problem. I think her uncontrolled diabetes is likely contributing to some of her pain, especially peripheral neuropathy in her feet/legs. We extensively discussed that getting her diabetes under better control will likely help her symptoms. Likely another huge component to her chronic pain is underlying depression. Can consider trying Cymbalta if her pain does not improve with better diabetes control. Appears she was prescribed Cymbalta in the past but unclear if she actually tried it. We also discussed that hydrocodone is not an appropriate medication to treat headaches. I recommended for her to continue Neurontin 600 mg BID and prescribed her ibuprofen 600 mg Q6H PRN but only to be used for 1 week. Her pain will need to be readdressed at her next visit.

## 2016-02-04 NOTE — Assessment & Plan Note (Addendum)
Last A1c >14. She did not tolerate Metformin due to GI side effects, even at the lowest dose. I recommended for her to continue Lantus 50 units QHS and to start taking Sitagliptin 100 mg daily. Advised for her to continue checking blood sugars three times daily and to bring in her glucometer to her next visit so we can adjust her insulin as needed. Will have her follow up in 2 weeks to reassess blood sugars. Will need repeat A1c next visit as she left before we could get a repeat one done.

## 2016-02-08 NOTE — Progress Notes (Signed)
Internal Medicine Clinic Attending  Case discussed with Dr. Rivet at the time of the visit.  We reviewed the resident's history and exam and pertinent patient test results.  I agree with the assessment, diagnosis, and plan of care documented in the resident's note.  

## 2016-03-30 ENCOUNTER — Ambulatory Visit (INDEPENDENT_AMBULATORY_CARE_PROVIDER_SITE_OTHER): Payer: Self-pay | Admitting: Internal Medicine

## 2016-03-30 ENCOUNTER — Telehealth: Payer: Self-pay | Admitting: *Deleted

## 2016-03-30 VITALS — BP 138/80 | HR 90 | Temp 97.9°F | Ht 64.0 in | Wt 150.4 lb

## 2016-03-30 DIAGNOSIS — Z9114 Patient's other noncompliance with medication regimen: Secondary | ICD-10-CM

## 2016-03-30 DIAGNOSIS — E1165 Type 2 diabetes mellitus with hyperglycemia: Secondary | ICD-10-CM

## 2016-03-30 DIAGNOSIS — IMO0002 Reserved for concepts with insufficient information to code with codable children: Secondary | ICD-10-CM

## 2016-03-30 DIAGNOSIS — E118 Type 2 diabetes mellitus with unspecified complications: Secondary | ICD-10-CM

## 2016-03-30 DIAGNOSIS — E1142 Type 2 diabetes mellitus with diabetic polyneuropathy: Secondary | ICD-10-CM

## 2016-03-30 DIAGNOSIS — Z794 Long term (current) use of insulin: Secondary | ICD-10-CM

## 2016-03-30 DIAGNOSIS — E559 Vitamin D deficiency, unspecified: Secondary | ICD-10-CM

## 2016-03-30 LAB — POCT GLYCOSYLATED HEMOGLOBIN (HGB A1C): HEMOGLOBIN A1C: 13.9

## 2016-03-30 LAB — GLUCOSE, CAPILLARY: GLUCOSE-CAPILLARY: 246 mg/dL — AB (ref 65–99)

## 2016-03-30 MED ORDER — AMITRIPTYLINE HCL 100 MG PO TABS: 100.0000 mg | ORAL_TABLET | Freq: Every day | ORAL | 0 refills | Status: DC

## 2016-03-30 NOTE — Patient Instructions (Signed)
It was a pleasure to see you today Adriana Burke.  I think your current pain is due to neuropathy. Diabetic neuropathy can contribute to this so going forward we need to work on controlling that. In the meantime I would like to check for vitamin D deficiency as a correctable cause of nerve pain.  I also recommend you start taking a new medicine Elavil (amitriptyline) 100mg  once daily at night for your nerve pains.  We should see you again within a month to see that your symptoms are getting better and change our plan if needed.

## 2016-03-30 NOTE — Telephone Encounter (Signed)
Agree with appointment

## 2016-03-30 NOTE — Progress Notes (Signed)
   CC: Burning painful sensation all over  HPI:  [name] is a 46 y.o. woman here today for a diffuse painful burning sensation on her skin all over.  See problem based assessment and plan below for additional details  Past Medical History:  Diagnosis Date  . Asthma   . Chronic rhinitis   . Depression   . Diabetes mellitus   . Episodic recurrent vertigo   . Fibromyalgia   . History of hip fracture    hx childhood hip fracture  . Hyperlipidemia   . Obesity   . Peripheral neuropathy ([place])   . Rheumatoid arthritis(714.0)    seen by Rheumatology in [facility], Rheumatoid Factor pos, Anti CCP pos in 2005 -? early presentation of RF  . Tinnitus    history of, Evaluated by ENT> [place]  . Vitamin D deficiency    history of    Review of Systems:  Review of Systems  Constitutional: Negative for fever.  Eyes: Negative for blurred vision.  Respiratory: Negative for shortness of breath.   Cardiovascular: Negative for leg swelling.  Gastrointestinal: Positive for constipation. Negative for diarrhea.  Genitourinary: Positive for frequency.  Musculoskeletal: Positive for joint pain and myalgias. Negative for falls.  Skin: Negative for rash.  Neurological: Positive for tingling. Negative for focal weakness.  Endo/Heme/Allergies: Positive for polydipsia.    Physical Exam: Physical Exam  Constitutional: She is well-developed, well-nourished, and in no distress.  HENT:  Mouth/Throat: Oropharynx is clear and moist.  Wearing a wig  Eyes: Conjunctivae are normal.  Cardiovascular: Normal rate and regular rhythm.   Pulmonary/Chest: Effort normal and breath sounds normal.  Abdominal: Soft. There is no tenderness.  Musculoskeletal:  She is very diffusely tender to touch over almost all locations including shoulders, upper and lower back, and legs  Neurological: She has normal reflexes. Gait normal.  Skin: No rash noted.    Vitals:   03/30/16 1518  BP: 138/80  Pulse: 90    Temp: 97.9 F (36.6 C)  TempSrc: Oral  SpO2: 99%  Weight: 150 lb 6.4 oz (68.2 kg)  Height: 5\' 4"  (1.626 m)    Assessment & Plan:   See Encounters Tab for problem based charting.  Patient discussed with Dr. Dareen Piano

## 2016-03-30 NOTE — Telephone Encounter (Signed)
Pt calls and states she is having these burning sensations through her body- from head down through chest, "all through my body" appt at [address] [facility]

## 2016-03-31 ENCOUNTER — Telehealth: Payer: Self-pay

## 2016-03-31 LAB — VITAMIN D 25 HYDROXY (VIT D DEFICIENCY, FRACTURES): Vit D, 25-Hydroxy: 8.3 ng/mL — ABNORMAL LOW (ref 30.0–100.0)

## 2016-03-31 NOTE — Telephone Encounter (Signed)
Requesting lab result. Please call back. 

## 2016-03-31 NOTE — Telephone Encounter (Signed)
Request sent to Dr Benjamine Mola who saw pt yesterday.

## 2016-04-02 NOTE — Assessment & Plan Note (Addendum)
HPI: Previously she was checked for vitamin D deficiency when having problems of depression/fibromyalgia and peripheral neuropathic pain. These tests did show vitamin D deficiency but she appears to have never actually gone treatment and was never a repeat showing an improvement and vitamin D level.  A: Long-standing untreated vitamin D deficiency may be contributing to her neuropathic pain complaints  P: -Check vitamin D level today -If as expected continued low vitamin D would recommend starting supplementation with 2000 units by mouth daily   Addendum 04/03/16: I was able to reach Adriana Burke by phone today and inform her of a very low vitamin D level of 8.  I am not certain this is contributing directly to her diffuse pains but it certainly could be with her having underlying neuropathy and possibly fibromyalgia. She did not have any other questions for me at this time. -I recommend she start supplementation with 2000 units daily. -We should recheck vitamin D levels in 3 months for a response to therapy

## 2016-04-02 NOTE — Assessment & Plan Note (Signed)
A: Her diabetes remains very uncontrolled and she has not been taking the previously prescribed medications with any good consistency. Hemoglobin A1c is 13.9% today. This process is her biggest medical issue and driving her neuropathic pain. Unfortunately she is very resistant to any changes in treatment especially now without medication coverage although she anticipates having this set back up within 4-6 weeks.  P: I recommended she try to get back to consistently using her Lantus 50 units and sitagliptin 100 mg daily so we can actually assess response to these treatments.

## 2016-04-02 NOTE — Assessment & Plan Note (Signed)
HPI: She has long-standing neuropathic pain mostly in her feet just been going on for years sometimes of radiation up her legs. She is not entirely sure when pain problems increased to affect her all over but thinks it's about the past month. She thinks taking her insulin is "doing nothing" so she has been frequently noncompliant on treatment. Notably she is also not taken previously prescribed gabapentin in the past few weeks reportedly due to a lapse in prescription medication coverage.  A: This appears to be an exacerbation of chronic diabetic polyneuropathy from stopping gabapentin and inconsistent insulin use. Given her very normal neurological exam I'm not concerned right now for developing CIDP. She is also believed to have fibromyalgia after previous evaluations they could be an expiration for such diffuse pain experience.  P: -Prescribe Elavil 100 mg daily at bedtime as [facility] $4 medication -Recommended she resume using her insulin consistently

## 2016-04-03 MED ORDER — VITAMIN D 50 MCG (2000 UT) PO TABS: 2000.0000 [IU] | ORAL_TABLET | Freq: Every day | ORAL | 1 refills | Status: DC

## 2016-04-03 NOTE — Progress Notes (Signed)
Internal Medicine Clinic Attending  Case discussed with Dr. Rice at the time of the visit.  We reviewed the resident's history and exam and pertinent patient test results.  I agree with the assessment, diagnosis, and plan of care documented in the resident's note.  

## 2016-04-03 NOTE — Addendum Note (Signed)
Addended by: Collier Salina on: 04/03/2016 08:51 AM   Modules accepted: Orders

## 2016-04-05 NOTE — Telephone Encounter (Signed)
I called Adriana Burke back and discussed her low vitamin D level. Instructions given for supplementation at 2000 units daily and she expressed understanding. I did not update this encounter to reflect this until today.

## 2016-04-16 ENCOUNTER — Encounter (HOSPITAL_COMMUNITY): Payer: Self-pay | Admitting: *Deleted

## 2016-04-16 ENCOUNTER — Inpatient Hospital Stay (HOSPITAL_COMMUNITY)
Admission: AD | Admit: 2016-04-16 | Discharge: 2016-04-16 | Disposition: A | Discharge status: Home / Self Care | Payer: Self-pay | Source: Ambulatory Visit | Attending: Obstetrics & Gynecology | Admitting: Obstetrics & Gynecology

## 2016-04-16 DIAGNOSIS — N939 Abnormal uterine and vaginal bleeding, unspecified: Secondary | ICD-10-CM | POA: Insufficient documentation

## 2016-04-16 DIAGNOSIS — E119 Type 2 diabetes mellitus without complications: Secondary | ICD-10-CM | POA: Insufficient documentation

## 2016-04-16 DIAGNOSIS — R300 Dysuria: Secondary | ICD-10-CM

## 2016-04-16 DIAGNOSIS — Z3202 Encounter for pregnancy test, result negative: Secondary | ICD-10-CM | POA: Insufficient documentation

## 2016-04-16 DIAGNOSIS — Z794 Long term (current) use of insulin: Secondary | ICD-10-CM | POA: Insufficient documentation

## 2016-04-16 DIAGNOSIS — E1121 Type 2 diabetes mellitus with diabetic nephropathy: Secondary | ICD-10-CM

## 2016-04-16 LAB — URINALYSIS, ROUTINE W REFLEX MICROSCOPIC
Bilirubin Urine: NEGATIVE
Glucose, UA: >=500 mg/dL — AB
KETONES UR: NEGATIVE mg/dL
NITRITE: NEGATIVE
Protein, ur: NEGATIVE mg/dL
Specific Gravity, Urine: 1.023 (ref 1.005–1.030)
pH: 5 (ref 5.0–8.0)

## 2016-04-16 LAB — POCT PREGNANCY, URINE: PREG TEST UR: NEGATIVE

## 2016-04-16 LAB — WET PREP, GENITAL
Sperm: NONE SEEN
Trich, Wet Prep: NONE SEEN
Yeast Wet Prep HPF POC: NONE SEEN

## 2016-04-16 NOTE — Discharge Instructions (Signed)
We will call you if your lab tests show you need additional treatment. Follow up with [facility].   Do not stop taking the Lantis. Stop drinking regular Pepsi and drink sugar free sodas if you have a soda.   Drink 8 glasses of water every day. Keep checking your blood sugars as you have been instructed. Can take Tylenol or Ibuprofen for uterine cramping.

## 2016-04-16 NOTE — MAU Provider Note (Signed)
History     CSN: [id number]  Arrival date and time: 04/16/16 1343   First Provider Initiated Contact with Patient 04/16/16 1427      No chief complaint on file.  HPI Adriana Burke 46 y.o. Comes to MAU with pain at the end of voiding.  Thinks she has a UTI.  No back pain.  No problem with vaginal discharge.  Itching is intermittent and mild.     OB History    Gravida Para Term Preterm AB Living   6 6 6     6    SAB TAB Ectopic Multiple Live Births         1        Past Medical History:  Diagnosis Date  . Asthma   . Chronic rhinitis   . Depression   . Diabetes mellitus   . Episodic recurrent vertigo   . Fibromyalgia   . History of hip fracture    hx childhood hip fracture  . Hyperlipidemia   . Obesity   . Peripheral neuropathy ([place])   . Rheumatoid arthritis(714.0)    seen by Rheumatology in [facility], Rheumatoid Factor pos, Anti CCP pos in 2005 -? early presentation of RF  . Tinnitus    history of, Evaluated by ENT> [place]  . Vitamin D deficiency    history of    Past Surgical History:  Procedure Laterality Date  . CESAREAN SECTION      Family History  Problem Relation Age of Onset  . Hypertension Mother   . Diabetes Maternal Grandmother   . Arthritis Maternal Grandmother   . Kidney disease Maternal Grandmother     Social History  Substance Use Topics  . Smoking status: Never Smoker  . Smokeless tobacco: Never Used  . Alcohol use 0.0 oz/week     Comment: occasionally    Allergies: No Known Allergies  Prescriptions Prior to Admission  Medication Sig Dispense Refill Last Dose  . albuterol (PROVENTIL HFA;VENTOLIN HFA) 108 (90 BASE) MCG/ACT inhaler Inhale 2 puffs into the lungs every 6 (six) hours as needed for wheezing or shortness of breath. 1 Inhaler 5 Past Month at Unknown time  . amitriptyline (ELAVIL) 100 MG tablet Take 1 tablet (100 mg total) by mouth at bedtime. 30 tablet 0   . Cholecalciferol (VITAMIN D) 2000 units tablet Take 1 tablet  (2,000 Units total) by mouth daily. 90 tablet 1   . diclofenac sodium (VOLTAREN) 1 % GEL Apply 2 g topically 4 (four) times daily. (Patient taking differently: Apply 2 g topically 4 (four) times daily as needed (pain). ) 100 g 1 Taking  . gabapentin (NEURONTIN) 300 MG capsule Take 2 capsules (600 mg total) by mouth at bedtime. 60 capsule 5   . glucose blood (ACCU-CHEK AVIVA PLUS) test strip Use to check blood sugar three times daily. ICD10: E11.65 100 each 11   . ibuprofen (ADVIL,MOTRIN) 600 MG tablet Take 1 tablet (600 mg total) by mouth every 6 (six) hours as needed. 30 tablet 0   . Insulin Glargine (LANTUS SOLOSTAR) 100 UNIT/ML Solostar Pen Inject 50 Units into the skin daily at 10 pm. 15 mL 11   . Insulin Pen Needle 31G X 5 MM MISC Please inject insulin 1 time daily. ICD10: E11.65 100 each 11   . loratadine (CLARITIN) 10 MG tablet Take 1 tablet (10 mg total) by mouth daily. 30 tablet 0 Taking  . sitaGLIPtin (JANUVIA) 100 MG tablet Take 1 tablet (100 mg total) by mouth daily.  30 tablet 5     Review of Systems  Constitutional: Negative for fever.  Genitourinary: Positive for dysuria.       Notices pressure at the end of voiding Thinking it is a UTI  Musculoskeletal: Negative for back pain.  Neurological:       Has neuropathy in both feed, they are painful and feel cold to her   Physical Exam   Blood pressure 118/77, pulse 92, temperature 98.5 F (36.9 C), temperature source Oral, height 5\' 2"  (1.575 m), weight 155 lb 2 oz (70.4 kg).  Physical Exam  Nursing note and vitals reviewed. Constitutional: She is oriented to person, place, and time. She appears well-developed and well-nourished.  HENT:  Head: Normocephalic.  Eyes: EOM are normal.  Neck: Neck supple.  GI: Soft. There is no tenderness.  Genitourinary:  Genitourinary Comments: Speculum exam: Vagina - Some erythema noted, bloody discharge, no odor Cervix - Small amount of active bleeding Bimanual exam: Cervix  closed Uterus tender to palpation, normal size Adnexa non tender, no masses bilaterally GC/Chlam, wet prep done Chaperone present for exam.   Musculoskeletal: Normal range of motion.  Neurological: She is alert and oriented to person, place, and time.  Skin: Skin is warm and dry.  Psychiatric: She has a normal mood and affect.    MAU Course  Procedures Results for orders placed or performed during the hospital encounter of 04/16/16 (from the past 24 hour(s))  Urinalysis, Routine w reflex microscopic     Status: Abnormal   Collection Time: 04/16/16  2:00 PM  Result Value Ref Range   Color, Urine YELLOW YELLOW   APPearance CLOUDY (A) CLEAR   Specific Gravity, Urine 1.023 1.005 - 1.030   pH 5.0 5.0 - 8.0   Glucose, UA >=500 (A) NEGATIVE mg/dL   Hgb urine dipstick MODERATE (A) NEGATIVE   Bilirubin Urine NEGATIVE NEGATIVE   Ketones, ur NEGATIVE NEGATIVE mg/dL   Protein, ur NEGATIVE NEGATIVE mg/dL   Nitrite NEGATIVE NEGATIVE   Leukocytes, UA MODERATE (A) NEGATIVE   RBC / HPF 6-30 0 - 5 RBC/hpf   WBC, UA TOO NUMEROUS TO COUNT 0 - 5 WBC/hpf   Bacteria, UA RARE (A) NONE SEEN   Squamous Epithelial / LPF 6-30 (A) NONE SEEN   Mucous PRESENT   Pregnancy, urine POC     Status: None   Collection Time: 04/16/16  2:11 PM  Result Value Ref Range   Preg Test, Ur NEGATIVE NEGATIVE  Wet prep, genital     Status: Abnormal   Collection Time: 04/16/16  2:50 PM  Result Value Ref Range   Yeast Wet Prep HPF POC NONE SEEN NONE SEEN   Trich, Wet Prep NONE SEEN NONE SEEN   Clue Cells Wet Prep HPF POC PRESENT (A) NONE SEEN   WBC, Wet Prep HPF POC RARE (A) NONE SEEN   Sperm NONE SEEN     MDM Client has diabetes and only takes Lantis.  Has a longstanding history of high blood sugars and elevated HgbA1C..  Is a client of [facility].  They are helping her to get an orange card as her insurance has expired.  Her job is getting new health insurance but it has not started yet.  No UTI is  definite today.  Will do urine culture for further evaluation.  Advised follow up at [facility].  Client did not report problems with vaginal discharge and will not treat for any problem today.  Will await other cultures.  She reports her cramping today is similar to when her period is starting.  Did not know exact day of LMP but did have a period in January 2018.  Assessment and Plan  Vaginal bleeding Pressure after urinating Hx of diabetes not well controlled.  Plan Urine culture pending. We will call you if your lab tests show you need additional treatment. Follow up with [facility].   Do not stop taking the Lantis. Stop drinking regular Pepsi and drink sugar free sodas if you have a soda.   Drink 8 glasses of water every day. Keep checking your blood sugars as you have been instructed. Can take Tylenol or Ibuprofen for uterine cramping.  Adriana Burke L Daxtin Leiker 04/16/2016, 2:28 PM

## 2016-04-17 LAB — GC/CHLAMYDIA PROBE AMP (~~LOC~~) NOT AT ARMC
CHLAMYDIA, DNA PROBE: NEGATIVE
Neisseria Gonorrhea: NEGATIVE

## 2016-04-17 LAB — HIV ANTIBODY (ROUTINE TESTING W REFLEX): HIV SCREEN 4TH GENERATION: NONREACTIVE

## 2016-04-17 LAB — RPR: RPR Ser Ql: NONREACTIVE

## 2016-04-18 LAB — URINE CULTURE

## 2016-05-03 ENCOUNTER — Encounter: Payer: Self-pay | Admitting: Internal Medicine

## 2016-05-03 ENCOUNTER — Ambulatory Visit (INDEPENDENT_AMBULATORY_CARE_PROVIDER_SITE_OTHER): Payer: Self-pay | Admitting: Internal Medicine

## 2016-05-03 VITALS — BP 127/75 | HR 91 | Temp 97.9°F | Ht 66.0 in | Wt 158.4 lb

## 2016-05-03 DIAGNOSIS — Z23 Encounter for immunization: Secondary | ICD-10-CM

## 2016-05-03 DIAGNOSIS — N3000 Acute cystitis without hematuria: Secondary | ICD-10-CM

## 2016-05-03 DIAGNOSIS — E118 Type 2 diabetes mellitus with unspecified complications: Secondary | ICD-10-CM

## 2016-05-03 DIAGNOSIS — Z9114 Patient's other noncompliance with medication regimen: Secondary | ICD-10-CM

## 2016-05-03 DIAGNOSIS — E1142 Type 2 diabetes mellitus with diabetic polyneuropathy: Secondary | ICD-10-CM

## 2016-05-03 DIAGNOSIS — Z794 Long term (current) use of insulin: Secondary | ICD-10-CM

## 2016-05-03 DIAGNOSIS — B9689 Other specified bacterial agents as the cause of diseases classified elsewhere: Secondary | ICD-10-CM

## 2016-05-03 DIAGNOSIS — E1165 Type 2 diabetes mellitus with hyperglycemia: Secondary | ICD-10-CM

## 2016-05-03 DIAGNOSIS — E559 Vitamin D deficiency, unspecified: Secondary | ICD-10-CM

## 2016-05-03 DIAGNOSIS — IMO0002 Reserved for concepts with insufficient information to code with codable children: Secondary | ICD-10-CM

## 2016-05-03 MED ORDER — AMITRIPTYLINE HCL 25 MG PO TABS: 25.0000 mg | ORAL_TABLET | Freq: Every day | ORAL | 1 refills | Status: DC

## 2016-05-03 MED ORDER — SULFAMETHOXAZOLE-TRIMETHOPRIM 400-80 MG PO TABS: 1.0000 | ORAL_TABLET | Freq: Two times a day (BID) | ORAL | 0 refills | Status: AC

## 2016-05-03 MED ORDER — VITAMIN D (ERGOCALCIFEROL) 1.25 MG (50000 UNIT) PO CAPS: 50000.0000 [IU] | ORAL_CAPSULE | ORAL | 0 refills | Status: DC

## 2016-05-03 NOTE — Progress Notes (Signed)
   CC: burning lower extremity pain, Vitamin D Deficiency  HPI:  [name] is a 46 y.o. F with medical history as outlined below who presents to the acute care clinic for management of her chronic neuropathic pain. Patient reports a several year history of burning pain of her BL feet, knees and also her right hip. She reports modest relief with Gabapentin 600 mg QHS however notes it makes her quite fatigued throughout the following day. She was prescribed Amitriptyline 100 mg at her last appointment 1 month ago however reports she was quite fatigued with this medication and self-discontinued this.   Vitamin D Deficiency: Severe deficiency diagnosed at her last visit. Was prescribed Vitamin D 2000 units daily however does not notice significant improvement of her symptoms.   Urinary tract infection: [facility] of [facility] A urinalysis and urine culture were obtained however patient reports she was never informed of the results nor did they offer treatment. She reports taking a few Amoxicillin that she had at home with some improvement of her symptoms.   Past Medical History:  Diagnosis Date  . Asthma   . Chronic rhinitis   . Depression   . Diabetes mellitus   . Episodic recurrent vertigo   . Fibromyalgia   . History of hip fracture    hx childhood hip fracture  . Hyperlipidemia   . Obesity   . Peripheral neuropathy ([place])   . Rheumatoid arthritis(714.0)    seen by Rheumatology in [facility], Rheumatoid Factor pos, Anti CCP pos in 2005 -? early presentation of RF  . Tinnitus    history of, Evaluated by ENT> [place]  . Vitamin D deficiency    history of   Review of Systems:  Review of Systems  Constitutional: Positive for malaise/fatigue. Negative for chills and fever.  Respiratory: Negative for cough, sputum production and shortness of breath.   Cardiovascular: Negative for chest pain, palpitations  and leg swelling.  Gastrointestinal: Negative for abdominal pain, nausea and vomiting.  Genitourinary: Positive for dysuria and frequency. Negative for flank pain and urgency.  Musculoskeletal: Positive for back pain and joint pain. Negative for falls.  Neurological: Positive for tingling and weakness. Negative for dizziness, focal weakness and headaches.   Physical Exam: Physical Exam  Constitutional: She is oriented to person, place, and time and well-developed, well-nourished, and in no distress.  Appears fatigued however non-toxic  HENT:  Head: Normocephalic and atraumatic.  Mouth/Throat: No oropharyngeal exudate.  Cardiovascular: Normal rate and regular rhythm.  Exam reveals no friction rub.   No murmur heard. Pulmonary/Chest: Effort normal and breath sounds normal. No respiratory distress. She has no wheezes. She has no rales.  Abdominal: Soft. Bowel sounds are normal. She exhibits no distension. There is no tenderness.  Neurological: She is alert and oriented to person, place, and time. She has normal strength. A sensory deficit (diminished sensation BL LE up to knees) is present. No cranial nerve deficit. Gait normal.  Skin: Skin is warm and dry. No rash noted.    Vitals:   05/03/16 1440  BP: 127/75  Pulse: 91  Temp: 97.9 F (36.6 C)  TempSrc: Oral  SpO2: 91%  Weight: 158 lb 6.4 oz (71.8 kg)  Height: 5\' 6"  (1.676 m)   Assessment & Plan:   See Encounters Tab for problem based charting.  Patient discussed with Dr. Eppie Gibson

## 2016-05-03 NOTE — Assessment & Plan Note (Signed)
Urinalysis (04/16/16):   Ref. Range 04/16/2016 14:00  Appearance Latest Ref Range: CLEAR  CLOUDY (A)  Bacteria, UA Latest Ref Range: NONE SEEN  RARE (A)  Bilirubin Urine Latest Ref Range: NEGATIVE  NEGATIVE  Color, Urine Latest Ref Range: YELLOW  YELLOW  Glucose Latest Ref Range: NEGATIVE mg/dL >=500 (A)  Hgb urine dipstick Latest Ref Range: NEGATIVE  MODERATE (A)  Ketones, ur Latest Ref Range: NEGATIVE mg/dL NEGATIVE  Leukocytes, UA Latest Ref Range: NEGATIVE  MODERATE (A)  Mucous Unknown PRESENT  Nitrite Latest Ref Range: NEGATIVE  NEGATIVE  pH Latest Ref Range: 5.0 - 8.0  5.0  Protein Latest Ref Range: NEGATIVE mg/dL NEGATIVE  RBC / HPF Latest Ref Range: 0 - 5 RBC/hpf 6-30  Specific Gravity, Urine Latest Ref Range: 1.005 - 1.030  1.023  Squamous Epithelial / LPF Latest Ref Range: NONE SEEN  6-30 (A)  WBC, UA Latest Ref Range: 0 - 5 WBC/hpf TOO NUMEROUS TO C...   She was seen at [facility] with complaints of dysuria. She reports urinalysis and urine culture were obtained however she was never notified of the results nor was she prescribed antibiotics. She ended up taking several remaining Amoxicillin from a prior Rx that she had at home with some improvement of her symptoms. She continues to experience some dysuria. Given the patients positive UA, lack of standard course of therapy, I am inclined to treat the patient for her UTI today with Bactrim. She is afebrile, and denies any constitutional symptoms that would indicate pyelonephritis.   Plan: Treat with course of Bactrim. Patient to return to clinic if symptoms persist or worsen despite compliance with full course of abx.

## 2016-05-03 NOTE — Assessment & Plan Note (Signed)
Assessment: This appears to be consistent with her chronic diabetic polyneuropathy pain however there certainly could be a component of fibromyalgia as well. She has extremely poor glycemic control (most recent HbA1c near 14%) and poor understanding of her disease process. She takes Lantus 50 units intermittently as she feels discouraged that insulin is not helping her neuropathy. She takes gabapentin 600 mg occasionally before bed, which does seem to help, however she complains of fatigue the following day. She was prescribed Amitriptyline 100mg  1 month ago however was noncompliant with this medication due to fatigue.   Plan: Encouraged patient to have better glycemic control as this could improve her neuropathy and limit its progression. In addition, have recommended that patient take 300 mg Gabapentin QAM and 300mg  or 600mg  at night as tolerated. Also, given her fibromyalgia/chronic pain co-diagnosis, I believe she would benefit from amitriptyline. Will restart this medication however at 1/4th the dose with the goal of titration up as tolerated.  -Gabapentin 300 mg QAM and 300 vs 600 mg QPM amitriptyline 25 mg daily, plan to increase as tolerated -Naproxen, tylenol -Topical analgesics

## 2016-05-03 NOTE — Patient Instructions (Addendum)
It was a pleasure meeting you today! I'm sorry your neuropathic pain is uncontrolled. Unfortunately we cannot take away all of your pain. We can however make it more tolerable. For this, I am restarting you on the medication Dr. Benjamine Mola started you on at the end of Janurary. You were put on a high dose of this medicine and I suspect you will need to increase this slowly. You may increase this to 50 mg if needed before your next appointment, but do not go higher than this.  Today we also talked about Gabapentin. This medication seems to take the edge off your neuropathic pain. Please continue taking this medication daily. You may take this medicine during the day as well.  Please come back to clinic in 1-2 months for a diabetes and pain follow up!

## 2016-05-03 NOTE — Assessment & Plan Note (Signed)
Encouraged continued compliance with Lantus 50 units QHS + Januvia. Counseled on the importance of glycemic control in limiting progression of complications from DM. She will continue to require counseling in this arena.

## 2016-05-03 NOTE — Assessment & Plan Note (Signed)
Received flu shot today. 

## 2016-05-03 NOTE — Assessment & Plan Note (Signed)
Assessment: Deficiency suspected at last visit and was confirmed with laboratory testing. She was prescribed Vitamin D 2000 units Qdaily however has been intermittently compliant with this medicine. She has not noticed improvement in her fatigue or body aches.   Plan: Encouraged compliance with this regimen and stressed the importance of proper vitamin D levels for mood, energy and pain. Will try patient on once weekly high dose Vitamin D for 8 weeks.  -Vitamin D 50,000 units qweekly x 8 weeks

## 2016-05-04 NOTE — Progress Notes (Signed)
Case discussed with Dr. Molt at the time of the visit. We reviewed the resident's history and exam and pertinent patient test results. I agree with the assessment, diagnosis, and plan of care documented in the resident's note. 

## 2016-05-30 ENCOUNTER — Other Ambulatory Visit: Payer: Self-pay | Admitting: Internal Medicine

## 2016-05-30 DIAGNOSIS — E1142 Type 2 diabetes mellitus with diabetic polyneuropathy: Secondary | ICD-10-CM

## 2016-06-13 ENCOUNTER — Telehealth: Payer: Self-pay | Admitting: Internal Medicine

## 2016-06-13 NOTE — Telephone Encounter (Signed)
Pt needs to speak with nurse

## 2016-06-17 ENCOUNTER — Emergency Department (HOSPITAL_COMMUNITY)
Admission: EM | Admit: 2016-06-17 | Discharge: 2016-06-17 | Disposition: A | Discharge status: Home / Self Care | Payer: Self-pay | Attending: Emergency Medicine | Admitting: Emergency Medicine

## 2016-06-17 ENCOUNTER — Encounter (HOSPITAL_COMMUNITY): Payer: Self-pay | Admitting: Nurse Practitioner

## 2016-06-17 ENCOUNTER — Emergency Department (HOSPITAL_COMMUNITY): Payer: Self-pay

## 2016-06-17 ENCOUNTER — Ambulatory Visit (HOSPITAL_COMMUNITY)
Admission: EM | Admit: 2016-06-17 | Discharge: 2016-06-17 | Disposition: A | Discharge status: Home / Self Care | Payer: Self-pay

## 2016-06-17 ENCOUNTER — Other Ambulatory Visit: Payer: Self-pay

## 2016-06-17 DIAGNOSIS — Z794 Long term (current) use of insulin: Secondary | ICD-10-CM | POA: Insufficient documentation

## 2016-06-17 DIAGNOSIS — R739 Hyperglycemia, unspecified: Secondary | ICD-10-CM

## 2016-06-17 DIAGNOSIS — Z79899 Other long term (current) drug therapy: Secondary | ICD-10-CM | POA: Insufficient documentation

## 2016-06-17 DIAGNOSIS — R0789 Other chest pain: Secondary | ICD-10-CM

## 2016-06-17 DIAGNOSIS — E1165 Type 2 diabetes mellitus with hyperglycemia: Secondary | ICD-10-CM | POA: Insufficient documentation

## 2016-06-17 DIAGNOSIS — R079 Chest pain, unspecified: Secondary | ICD-10-CM

## 2016-06-17 DIAGNOSIS — E1140 Type 2 diabetes mellitus with diabetic neuropathy, unspecified: Secondary | ICD-10-CM | POA: Insufficient documentation

## 2016-06-17 DIAGNOSIS — J4520 Mild intermittent asthma, uncomplicated: Secondary | ICD-10-CM

## 2016-06-17 LAB — BASIC METABOLIC PANEL
Anion gap: 12 (ref 5–15)
BUN: 10 mg/dL (ref 6–20)
CALCIUM: 9.6 mg/dL (ref 8.9–10.3)
CO2: 24 mmol/L (ref 22–32)
CREATININE: 0.94 mg/dL (ref 0.44–1.00)
Chloride: 93 mmol/L — ABNORMAL LOW (ref 101–111)
Glucose, Bld: 419 mg/dL — ABNORMAL HIGH (ref 65–99)
Potassium: 4.1 mmol/L (ref 3.5–5.1)
SODIUM: 129 mmol/L — AB (ref 135–145)

## 2016-06-17 LAB — CBC
HCT: 39.4 % (ref 36.0–46.0)
Hemoglobin: 13.1 g/dL (ref 12.0–15.0)
MCH: 23.2 pg — ABNORMAL LOW (ref 26.0–34.0)
MCHC: 33.2 g/dL (ref 30.0–36.0)
MCV: 69.7 fL — ABNORMAL LOW (ref 78.0–100.0)
PLATELETS: 222 10*3/uL (ref 150–400)
RBC: 5.65 MIL/uL — AB (ref 3.87–5.11)
RDW: 15 % (ref 11.5–15.5)
WBC: 5.4 10*3/uL (ref 4.0–10.5)

## 2016-06-17 MED ORDER — SODIUM CHLORIDE 0.9 % IV BOLUS (SEPSIS)
1000.0000 mL | Freq: Once | INTRAVENOUS | Status: AC
  Administered 2016-06-17: 1000 mL via INTRAVENOUS

## 2016-06-17 MED ORDER — KETOROLAC TROMETHAMINE 30 MG/ML IJ SOLN
30.0000 mg | Freq: Once | INTRAMUSCULAR | Status: AC
Start: 2016-06-17 — End: 2016-06-17
  Administered 2016-06-17: 30 mg via INTRAVENOUS
  Filled 2016-06-17: qty 1

## 2016-06-17 MED ORDER — INSULIN ASPART 100 UNIT/ML ~~LOC~~ SOLN
10.0000 [IU] | Freq: Once | SUBCUTANEOUS | Status: AC
  Administered 2016-06-17: 10 [IU] via SUBCUTANEOUS
  Filled 2016-06-17: qty 1

## 2016-06-17 MED ORDER — ALBUTEROL SULFATE HFA 108 (90 BASE) MCG/ACT IN AERS
2.0000 | INHALATION_SPRAY | RESPIRATORY_TRACT | Status: DC | PRN
  Administered 2016-06-17: 2 via RESPIRATORY_TRACT
  Filled 2016-06-17: qty 6.7

## 2016-06-17 NOTE — ED Triage Notes (Signed)
Pt presents with c/o CP. The pain began yesterday. The pain is generalized across her entire chest. The pain is described as sharp and constant. She reports dizziness, SOB. She tried aleve with no relief.

## 2016-06-17 NOTE — Discharge Instructions (Addendum)
Use albuterol as needed for shortness of breath. Continue supportive care for chest wall pain, including ibuprofen and Tylenol. Follow up with PCP for further evaluation. Return to ED for worsening chest pain, leg swelling, coughing up blood, syncope, trouble breathing, wheezing or injury. Measures take her insulin on a regular basis goal is to keep your blood sugars around 100 the longer your blood sugar stays elevated to more stress there is no blood vessels kidneys and internal organs. Make an appointment with the outpatient clinic and speak with them they can assist with your medications

## 2016-06-17 NOTE — ED Provider Notes (Signed)
Skin care patient at shift change but she was noted to be 419 sodium 129 anion gap of 12 she will be given a liter fluid 10 units of Humalog insulin subcutaneous and reassessed. Patient has been reassessed blood sugars now 300 she's feeling at her baseline she's been encouraged to use her insulin on a regular basis she states that she's only been using it every other day trying to make it last longer she is establishing care with [facility] she will call on Monday to set an appointment and discuss obtaining her medications she's also been given an inhaler to use on a regular basis and reluctant to start her on steroids at this time due to her diabetes. She will use inhaler 2 puffs every 4-6 hours while awake for the next 2 days then as needed  She has been given return parameters   Junius Creamer, NP 06/17/16 [place], MD 06/24/16 1714

## 2016-06-17 NOTE — ED Provider Notes (Signed)
[facility] DEPT Provider Note   CSN: [id number] Arrival date & time: 06/17/16  1652     History   Chief Complaint Chief Complaint  Patient presents with  . Chest Pain    HPI Adriana Burke is a 46 y.o. female.  Patient whose past medical history includes asthma and diabetes presents with sharp chest pain that began yesterday. Pain is located in the midsternal area and radiates throughout the whole chest. Pain increases with deep breathing. States that she felt a little short of breath today as well. She has been out of her albuterol inhaler and has not been able to use it. She is also complaining of tingling feelings in both of her legs that has been going on for a while, which she states that her PCP says is related to her neuropathy. States she is compliant with her insulin use, but morning blood sugars can sometimes range in 300s-400s.  She denies any leg swelling, OCP use, palpitations, history of cancer, history of blood clots, hemoptysis, bowel complaints, abdominal pain, urinary complaints, headaches, vision changes. No past cardiac history. Of note, patient has not been hospitalized for asthma exacerbation for "many many years." She states she has never had to been intubated for her asthma.      Past Medical History:  Diagnosis Date  . Asthma   . Chronic rhinitis   . Depression   . Diabetes mellitus   . Episodic recurrent vertigo   . Fibromyalgia   . History of hip fracture    hx childhood hip fracture  . Hyperlipidemia   . Obesity   . Peripheral neuropathy   . Rheumatoid arthritis(714.0)    seen by Rheumatology in [facility], Rheumatoid Factor pos, Anti CCP pos in 2005 -? early presentation of RF  . Tinnitus    history of, Evaluated by ENT> [place]  . Vitamin D deficiency    history of    Patient Active Problem List   Diagnosis Date Noted  . Acute cystitis without hematuria 09/16/2014  . Encounter for immunization 02/24/2014  . Fibrocystic changes of  right breast 12/03/2013  . Bilateral hip pain 10/14/2013  . Alopecia areata 09/16/2013  . Impaired hearing rt ear 01/02/2013  . Abnormality of gait 06/12/2012  . Osteoarthritis of left hip 05/31/2011  . Chronic generalized pain 04/01/2011  . Diabetic polyneuropathy ([place]) 02/24/2010  . FIBROMYALGIA 10/29/2009  . WRIST PAIN, BILATERAL 11/16/2008  . Vitamin D deficiency 07/24/2008  . RHEUMATOID ARTHRITIS 04/17/2008  . HYPERLIPIDEMIA 04/08/2008  . Type II diabetes mellitus with manifestations, uncontrolled ([place]) 01/08/2008  . KNEE PAIN, BILATERAL 08/23/2006  . DEPRESSION 04/10/2006  . ASTHMA 01/08/2006    Past Surgical History:  Procedure Laterality Date  . CESAREAN SECTION      OB History    Gravida Para Term Preterm AB Living   6 6 6     6    SAB TAB Ectopic Multiple Live Births         1         Home Medications    Prior to Admission medications   Medication Sig Start Date End Date Taking? Authorizing Provider  albuterol (PROVENTIL HFA;VENTOLIN HFA) 108 (90 BASE) MCG/ACT inhaler Inhale 2 puffs into the lungs every 6 (six) hours as needed for wheezing or shortness of breath. 10/14/13  Yes Corky Sox, MD  amitriptyline (ELAVIL) 100 MG tablet TAKE 1 TABLET (100 MG TOTAL) BY MOUTH AT BEDTIME. 05/30/16  Yes Minus Liberty, MD  gabapentin (NEURONTIN)  300 MG capsule Take 2 capsules (600 mg total) by mouth at bedtime. Patient taking differently: Take 300 mg by mouth daily.  07/06/15 07/05/16 Yes Corky Sox, MD  insulin glargine (LANTUS) 100 unit/mL SOPN Inject 50 Units into the skin at bedtime.   Yes Historical Provider, MD  naproxen sodium (ALEVE) 220 MG tablet Take 440 mg by mouth 2 (two) times daily as needed (pain/headache).   Yes Historical Provider, MD  amitriptyline (ELAVIL) 25 MG tablet Take 1 tablet (25 mg total) by mouth at bedtime. Patient not taking: Reported on 06/17/2016 05/03/16   Bethany Molt, DO  sitaGLIPtin (JANUVIA) 100 MG tablet Take 1 tablet (100 mg total) by  mouth daily. Patient not taking: Reported on 06/17/2016 02/02/16   Juliet Rude, MD  Vitamin D, Ergocalciferol, (DRISDOL) 50000 units CAPS capsule Take 1 capsule (50,000 Units total) by mouth every 7 (seven) days. Patient not taking: Reported on 06/17/2016 05/03/16   Bethany Molt, DO    Family History Family History  Problem Relation Age of Onset  . Hypertension Mother   . Diabetes Maternal Grandmother   . Arthritis Maternal Grandmother   . Kidney disease Maternal Grandmother     Social History Social History  Substance Use Topics  . Smoking status: Never Smoker  . Smokeless tobacco: Never Used  . Alcohol use 0.0 oz/week     Comment: occasionally     Allergies   Patient has no known allergies.   Review of Systems Review of Systems  Constitutional: Negative for appetite change, chills and fever.  HENT: Negative for ear pain, rhinorrhea, sneezing and sore throat.   Eyes: Negative for photophobia and visual disturbance.  Respiratory: Positive for cough, chest tightness and shortness of breath. Negative for wheezing.   Cardiovascular: Negative for chest pain, palpitations and leg swelling.  Gastrointestinal: Negative for abdominal pain, blood in stool, constipation, diarrhea, nausea and vomiting.  Genitourinary: Negative for dysuria, hematuria, urgency and vaginal bleeding.  Musculoskeletal: Negative for back pain and myalgias.  Skin: Negative for rash.  Neurological: Positive for numbness. Negative for dizziness, weakness and light-headedness.     Physical Exam Updated Vital Signs BP 112/86 (BP Location: Right Arm)   Pulse 92   Temp 98.9 F (37.2 C) (Oral)   Resp 16   LMP 06/05/2016 (Approximate)   SpO2 100%   Physical Exam  Constitutional: She appears well-developed and well-nourished. No distress.  HENT:  Head: Normocephalic and atraumatic.  Nose: Nose normal.  Eyes: Conjunctivae and EOM are normal. Right eye exhibits no discharge. Left eye exhibits no  discharge. No scleral icterus.  Neck: Normal range of motion. Neck supple.  Cardiovascular: Normal rate, regular rhythm, normal heart sounds and intact distal pulses.  Exam reveals no gallop and no friction rub.   No murmur heard. Pulmonary/Chest: Effort normal and breath sounds normal. No respiratory distress. She has no wheezes. She exhibits no tenderness.  Abdominal: Soft. Bowel sounds are normal. She exhibits no distension. There is no tenderness. There is no guarding.  Musculoskeletal: Normal range of motion. She exhibits no edema.  Neurological: She is alert. She exhibits normal muscle tone. Coordination normal.  Skin: Skin is warm and dry. No rash noted.  Psychiatric: She has a normal mood and affect.  Nursing note and vitals reviewed.    ED Treatments / Results  Labs (all labs ordered are listed, but only abnormal results are displayed) Labs Reviewed  BASIC METABOLIC PANEL - Abnormal; Notable for the following:  Result Value   Sodium 129 (*)    Chloride 93 (*)    Glucose, Bld 419 (*)    All other components within normal limits  CBC - Abnormal; Notable for the following:    RBC 5.65 (*)    MCV 69.7 (*)    MCH 23.2 (*)    All other components within normal limits  I-STAT TROPOININ, ED  I-STAT TROPOININ, ED  CBG MONITORING, ED    EKG  EKG Interpretation None       Radiology Dg Chest 2 View  Result Date: 06/17/2016 CLINICAL DATA:  Chest pain and shortness of breath for 2 days. EXAM: CHEST  2 VIEW COMPARISON:  05/25/2014 and prior exams FINDINGS: The cardiomediastinal silhouette is unremarkable. There is no evidence of focal airspace disease, pulmonary edema, suspicious pulmonary nodule/mass, pleural effusion, or pneumothorax. No acute bony abnormalities are identified. IMPRESSION: No active cardiopulmonary disease. Electronically Signed   By: Margarette Canada M.D.   On: 06/17/2016 17:56    Procedures Procedures (including critical care time)  Medications Ordered  in ED Medications  albuterol (PROVENTIL HFA;VENTOLIN HFA) 108 (90 Base) MCG/ACT inhaler 2 puff (2 puffs Inhalation Given 06/17/16 2247)  sodium chloride 0.9 % bolus 1,000 mL (0 mLs Intravenous Stopped 06/17/16 2128)  insulin aspart (novoLOG) injection 10 Units (10 Units Subcutaneous Given 06/17/16 2054)  ketorolac (TORADOL) 30 MG/ML injection 30 mg (30 mg Intravenous Given 06/17/16 2054)     Initial Impression / Assessment and Plan / ED Course  I have reviewed the triage vital signs and the nursing notes.  Pertinent labs & imaging results that were available during my care of the patient were reviewed by me and considered in my medical decision making (see chart for details).     Patient's history and symptoms concerning for asthma versus PE versus low likelihood of ACS versus pneumonia versus URI vs. Arrhythmia. Chest x-ray did not show any acute cardiopulmonary process. Her physical exam revealed no respiratory distress, use of accessory muscles, wheezes, rales on auscultation. She is afebrile. Her O2 sats ranged from 99-100%. No productive cough reported. Patient is low likelihood for PE considering heart rate is normal, no OCP use, no leg swelling, no prior clots in passed. EKG revealed no changes from prevoius. Troponin was collected x2. Symptoms likely due to musculoskeletal chest pain.  Patient's BMP revealed glucose >400. Will begin on fluids and insulin.  Will plan to give albuterol inhaler for prn use and encouraged to follow up with PCP. Care resumed by Junius Creamer, NP at end of shift.    Final Clinical Impressions(s) / ED Diagnoses   Final diagnoses:  Chest wall pain  Nonspecific chest pain  Mild intermittent asthma, unspecified whether complicated  Hyperglycemia    New Prescriptions Discharge Medication List as of 06/17/2016 10:44 PM       Dhilan Brauer Shelly Coss, PA-C 06/17/16 [place], MD 06/24/16 1557

## 2016-06-18 LAB — I-STAT TROPONIN, ED
TROPONIN I, POC: 0 ng/mL (ref 0.00–0.08)
TROPONIN I, POC: 0 ng/mL (ref 0.00–0.08)

## 2016-06-18 LAB — CBG MONITORING, ED: GLUCOSE-CAPILLARY: 300 mg/dL — AB (ref 65–99)

## 2016-06-20 NOTE — Telephone Encounter (Signed)
Returned patient's call she complained of severe pain in both feet appointment scheduled for tomorrow at 3:15

## 2016-06-21 ENCOUNTER — Ambulatory Visit: Payer: Self-pay

## 2016-07-14 ENCOUNTER — Ambulatory Visit (INDEPENDENT_AMBULATORY_CARE_PROVIDER_SITE_OTHER): Payer: Self-pay | Admitting: Internal Medicine

## 2016-07-14 ENCOUNTER — Encounter: Payer: Self-pay | Admitting: Pharmacist

## 2016-07-14 DIAGNOSIS — E1142 Type 2 diabetes mellitus with diabetic polyneuropathy: Secondary | ICD-10-CM

## 2016-07-14 MED ORDER — SITAGLIPTIN PHOSPHATE 100 MG PO TABS: 100.0000 mg | ORAL_TABLET | Freq: Every day | ORAL | 5 refills | Status: DC

## 2016-07-14 MED ORDER — INSULIN GLARGINE 100 UNITS/ML SOLOSTAR PEN: 50.0000 [IU] | PEN_INJECTOR | Freq: Every day | SUBCUTANEOUS | 5 refills | Status: DC

## 2016-07-14 MED ORDER — GABAPENTIN 300 MG PO CAPS: 600.0000 mg | ORAL_CAPSULE | Freq: Two times a day (BID) | ORAL | 2 refills | Status: DC

## 2016-07-14 NOTE — Patient Instructions (Addendum)
General Instructions: - Increase Gabapentin to 600 mg twice daily - try rubbing capsaicin cream on feet twice daily - Important thing is to get blood sugars under better control! - Prescriptions sent to health department- this should be cheaper there - Restart Januvia when able to get from health department. Continue Lantus 50 units at bedtime.  Please bring your medicines with you each time you come to clinic.  Medicines may include prescription medications, over-the-counter medications, herbal remedies, eye drops, vitamins, or other pills.   Progress Toward Treatment Goals:  Treatment Goal 02/24/2014  Hemoglobin A1C unchanged  Prevent falls at goal    Self Care Goals & Plans:  Self Care Goal 07/06/2015  Manage my medications take my medicines as prescribed; bring my medications to every visit; refill my medications on time  Monitor my health keep track of my blood glucose; bring my glucose meter and log to each visit  Eat healthy foods drink diet soda or water instead of juice or soda; eat more vegetables; eat foods that are low in salt; eat baked foods instead of fried foods; eat fruit for snacks and desserts  Be physically active -  Prevent falls wear appropriate shoes  Meeting treatment goals maintain the current self-care plan    Home Blood Glucose Monitoring 02/24/2014  Check my blood sugar no home glucose monitoring  When to check my blood sugar N/A     Care Management & Community Referrals:  Referral 02/24/2014  Referrals made for care management support -  Referrals made to community resources none

## 2016-07-14 NOTE — Progress Notes (Signed)
Medication Samples have been provided to the patient.  Drug: insulin glargine Strength: 100 untis/mL Qty: 4 LOT: [id number] Exp.Date: February 2020 Dosing instructions: 50 units daily  The patient has been instructed regarding the correct time, dose, and frequency of taking this medication, including desired effects and most common side effects.   Adriana Burke 3:49 PM 07/14/2016

## 2016-07-14 NOTE — Progress Notes (Signed)
   CC: Foot pain  HPI:  [name] is a 46 y.o. woman with PMHx as noted below who presents today for evaluation of foot pain.  She describes having a burning sensation in both feet which is worse on the left side. She states the pain is worst in her big toe and ball of her foot on the left side. She feels that she is walking on rocks sometimes. She describes the pain as being so severe that she loses her breath. She has a longstanding hx of uncontrolled diabetes. She states her blood sugars have not been doing well because she is unable to afford her medications. She will take Lantus 50 units at bedtime but not every day. She cannot afford the Sitagliptin. She has been taking Gabapentin 300 mg twice daily with little relief. She notes being unable to tolerate high doses of Gabapentin due to sedation. She stopped taking her Amitriptyline due to cost and sedating effects.   Past Medical History:  Diagnosis Date  . Asthma   . Chronic rhinitis   . Depression   . Diabetes mellitus   . Episodic recurrent vertigo   . Fibromyalgia   . History of hip fracture    hx childhood hip fracture  . Hyperlipidemia   . Obesity   . Peripheral neuropathy   . Rheumatoid arthritis(714.0)    seen by Rheumatology in [facility], Rheumatoid Factor pos, Anti CCP pos in 2005 -? early presentation of RF  . Tinnitus    history of, Evaluated by ENT> [place]  . Vitamin D deficiency    history of    Review of Systems:   All negative except per HPI  Physical Exam:  Vitals:   07/14/16 1503  BP: 125/69  Pulse: 99  Temp: 98.2 F (36.8 C)  TempSrc: Oral  SpO2: 100%  Weight: 150 lb 9.6 oz (68.3 kg)   General: Well-nourished woman in NAD Ext: no peripheral edema, no deformities of feet noted Neuro: Decreased sensation to pinprick to mid-shins bilaterally, worse on left side.   Assessment & Plan:   See Encounters Tab for problem based charting.  Patient discussed with Dr. Dareen Piano

## 2016-07-16 NOTE — Assessment & Plan Note (Signed)
Had a long discussion with patient that her foot pain is due to damage to her nerves from her uncontrolled diabetes. Emphasized to her that getting her blood sugars under better control will hopefully help the pain. Since medication costs are a huge issue for her, will send her prescriptions to [facility] where she can likely get them for free. She was given free samples of Lantus today. Advised her to increase Gabapentin to 600 mg twice daily. Will also have her try Capsaicin cream 2-3 times daily to see if this provides any relief. She was given a sample of this. She may benefit from Cymbalta in the future if able to get at the health dept.

## 2016-07-17 NOTE — Progress Notes (Signed)
Internal Medicine Clinic Attending  Case discussed with Dr. Rivet soon after the resident saw the patient.  We reviewed the resident's history and exam and pertinent patient test results.  I agree with the assessment, diagnosis, and plan of care documented in the resident's note.  

## 2016-08-03 ENCOUNTER — Ambulatory Visit: Payer: Self-pay

## 2016-08-07 ENCOUNTER — Ambulatory Visit: Payer: Self-pay

## 2016-08-11 ENCOUNTER — Encounter: Payer: Self-pay | Admitting: *Deleted

## 2016-11-06 ENCOUNTER — Ambulatory Visit (HOSPITAL_COMMUNITY)
Admission: EM | Admit: 2016-11-06 | Discharge: 2016-11-06 | Disposition: A | Discharge status: Home / Self Care | Payer: Self-pay | Attending: Family Medicine | Admitting: Family Medicine

## 2016-11-06 ENCOUNTER — Encounter (HOSPITAL_COMMUNITY): Payer: Self-pay | Admitting: Emergency Medicine

## 2016-11-06 DIAGNOSIS — R062 Wheezing: Secondary | ICD-10-CM

## 2016-11-06 DIAGNOSIS — J4521 Mild intermittent asthma with (acute) exacerbation: Secondary | ICD-10-CM

## 2016-11-06 DIAGNOSIS — R0789 Other chest pain: Secondary | ICD-10-CM

## 2016-11-06 MED ORDER — ALBUTEROL SULFATE HFA 108 (90 BASE) MCG/ACT IN AERS: 1.0000 | INHALATION_SPRAY | Freq: Four times a day (QID) | RESPIRATORY_TRACT | 0 refills | Status: AC | PRN

## 2016-11-06 MED ORDER — ALBUTEROL SULFATE (2.5 MG/3ML) 0.083% IN NEBU
2.5000 mg | INHALATION_SOLUTION | Freq: Once | RESPIRATORY_TRACT | Status: AC
  Administered 2016-11-06: 2.5 mg via RESPIRATORY_TRACT

## 2016-11-06 MED ORDER — ALBUTEROL SULFATE (2.5 MG/3ML) 0.083% IN NEBU
INHALATION_SOLUTION | RESPIRATORY_TRACT | Status: AC
  Filled 2016-11-06: qty 3

## 2016-11-06 MED ORDER — METHYLPREDNISOLONE 4 MG PO TBPK: ORAL_TABLET | ORAL | 0 refills | Status: DC

## 2016-11-06 NOTE — ED Triage Notes (Signed)
Pt reports a tightness in her chest and coughing for the last few days.  Pt has a history of asthma with no inhalers available to her.

## 2016-11-06 NOTE — ED Provider Notes (Signed)
[place]   [id number] 11/06/16 Arrival Time: 0932  ASSESSMENT & PLAN:  No diagnosis found.  Meds ordered this encounter  Medications  . albuterol (PROVENTIL) (2.5 MG/3ML) 0.083% nebulizer solution 2.5 mg    Reviewed expectations re: course of current medical issues. Questions answered. Outlined signs and symptoms indicating need for more acute intervention. Patient verbalized understanding. After Visit Summary given.   SUBJECTIVE:  Adriana Burke is a 46 y.o. female who presents with complaint of SOB, wheezing, and chest tightness.  She has hx of asthma.  She has no rescue inhaler.  ROS: As per HPI.   OBJECTIVE:  Vitals:   11/06/16 1822  BP: 116/84  Pulse: 89  Temp: 98.3 F (36.8 C)  TempSrc: Oral  SpO2: 100%    General appearance: alert; no distress Eyes: PERRLA; EOMI; conjunctiva normal HENT: normocephalic; atraumatic; TMs normal; nasal mucosa normal; oral mucosa normal Neck: supple Lungs: clear to auscultation bilaterally Heart: regular rate and rhythm Abdomen: soft, non-tender; bowel sounds normal; no masses or organomegaly; no guarding or rebound tenderness Back: no CVA tenderness Extremities: no cyanosis or edema; symmetrical with no gross deformities Skin: warm and dry Neurologic: normal gait; normal symmetric reflexes Psychological: alert and cooperative; normal mood and affect  Labs: Results for orders placed or performed during the hospital encounter of 67/12/45  Basic metabolic panel  Result Value Ref Range   Sodium 129 (L) 135 - 145 mmol/L   Potassium 4.1 3.5 - 5.1 mmol/L   Chloride 93 (L) 101 - 111 mmol/L   CO2 24 22 - 32 mmol/L   Glucose, Bld 419 (H) 65 - 99 mg/dL   BUN 10 6 - 20 mg/dL   Creatinine, Ser 0.94 0.44 - 1.00 mg/dL   Calcium 9.6 8.9 - 10.3 mg/dL   GFR calc non Af Amer >60 >60 mL/min   GFR calc Af Amer >60 >60 mL/min   Anion gap 12 5 - 15  CBC  Result Value Ref Range   WBC 5.4 4.0 - 10.5 K/uL   RBC 5.65 (H) 3.87  - 5.11 MIL/uL   Hemoglobin 13.1 12.0 - 15.0 g/dL   HCT 39.4 36.0 - 46.0 %   MCV 69.7 (L) 78.0 - 100.0 fL   MCH 23.2 (L) 26.0 - 34.0 pg   MCHC 33.2 30.0 - 36.0 g/dL   RDW 15.0 11.5 - 15.5 %   Platelets 222 150 - 400 K/uL  I-stat troponin, ED  Result Value Ref Range   Troponin i, poc 0.00 0.00 - 0.08 ng/mL   Comment 3          I-stat troponin, ED  Result Value Ref Range   Troponin i, poc 0.00 0.00 - 0.08 ng/mL   Comment 3          POC CBG, ED  Result Value Ref Range   Glucose-Capillary 300 (H) 65 - 99 mg/dL   Labs Reviewed - No data to display  Imaging: No results found.  No Known Allergies  Past Medical History:  Diagnosis Date  . Asthma   . Chronic rhinitis   . Depression   . Diabetes mellitus   . Episodic recurrent vertigo   . Fibromyalgia   . History of hip fracture    hx childhood hip fracture  . Hyperlipidemia   . Obesity   . Peripheral neuropathy   . Rheumatoid arthritis(714.0)    seen by Rheumatology in [facility], Rheumatoid Factor pos, Anti CCP pos in 2005 -? early presentation  of RF  . Tinnitus    history of, Evaluated by ENT> [place]  . Vitamin D deficiency    history of   Social History   Social History  . Marital status: Legally Separated    Spouse name: N/A  . Number of children: N/A  . Years of education: 73   Occupational History  .  [employer] History Main Topics  . Smoking status: Never Smoker  . Smokeless tobacco: Never Used  . Alcohol use 0.0 oz/week     Comment: occasionally  . Drug use: No  . Sexual activity: Not on file   Other Topics Concern  . Not on file   Social History Narrative   [phone]   Family History  Problem Relation Age of Onset  . Hypertension Mother   . Diabetes Maternal Grandmother   . Arthritis Maternal Grandmother   . Kidney disease Maternal Grandmother    Past Surgical History:  Procedure Laterality Date  . CESAREAN SECTION       Lysbeth Penner, Kimberly 11/06/16 1859

## 2016-12-11 ENCOUNTER — Encounter (INDEPENDENT_AMBULATORY_CARE_PROVIDER_SITE_OTHER): Payer: Self-pay

## 2016-12-11 ENCOUNTER — Ambulatory Visit (INDEPENDENT_AMBULATORY_CARE_PROVIDER_SITE_OTHER): Payer: Self-pay | Admitting: Internal Medicine

## 2016-12-11 VITALS — BP 123/88 | HR 96 | Temp 98.4°F | Ht 64.0 in | Wt 149.0 lb

## 2016-12-11 DIAGNOSIS — IMO0002 Reserved for concepts with insufficient information to code with codable children: Secondary | ICD-10-CM

## 2016-12-11 DIAGNOSIS — E1165 Type 2 diabetes mellitus with hyperglycemia: Secondary | ICD-10-CM

## 2016-12-11 DIAGNOSIS — E1142 Type 2 diabetes mellitus with diabetic polyneuropathy: Secondary | ICD-10-CM

## 2016-12-11 DIAGNOSIS — E118 Type 2 diabetes mellitus with unspecified complications: Principal | ICD-10-CM

## 2016-12-11 MED ORDER — GABAPENTIN 600 MG PO TABS: ORAL_TABLET | ORAL | 2 refills | Status: DC

## 2016-12-11 MED ORDER — INSULIN GLARGINE 100 UNIT/ML SOLOSTAR PEN: 20.0000 [IU] | PEN_INJECTOR | Freq: Every day | SUBCUTANEOUS | 0 refills | Status: DC

## 2016-12-11 NOTE — Assessment & Plan Note (Signed)
The patient states that she has bilateral foot pain for the past 4-5 months. The pain is present over the arch of the foot and at the metatarsal region. The pain is extreme >10/10, sharp and stinging in nature, constant, no exacerbating factors. She describes it as if there is knots in her legs and as if she is walking on rocks. Tylenol and gabapentin do not help the pain. The pain is accompanied with numbness and tingling.   The patient's last A1C was 13.9 in January 2018. She is supposed to take lantus 50u qd and januvia 100mg  qd. However, the patient reports that she has not been compliant with the recommended medication regimen due to inability to afford the medication. She is being treated for diabetic neuropathy with gabapentin 600mg  bid  The patient states that she also has pain in her bilateral knees and to left hip. The left hip pain has been present for the past 2-3 years. She has also had pain in her bilateral wrists in the past. She is being treated for fibromyalgia with amitriptyline 25mg  qd.    The patient's pain in bilateral feet is likely due to diabetic neuropathy due to the nature of the patient's symptoms.   -Increase gabapentin to 600mg  four times a day. The patient was recommended to take 600mg  in the morning and afternoon and 2 tablets of 600mg  at night.

## 2016-12-11 NOTE — Assessment & Plan Note (Signed)
The patient's last A1C was 13.9 in January 2018. She is supposed to take lantus 50u qd and januvia 100mg  qd. However, the patient reports that she has not been compliant with the recommended medication regimen due to inability to afford the medication.   Since the patiient has not been taking her diabetic medication as recommended for several months now. The lantus dosage has been decreased to prevent hypoglycemia.   -Patient instructed to take 20u of lantus nightly. The patient was given samples of lantus pen to last her till her next follow up appointment.

## 2016-12-11 NOTE — Patient Instructions (Addendum)
Thank you for allowing me to be a part of your care. During this visit we spoke about your foot pain. The pain is likely due to nerve pain from your diabetes. The following changes were made to your medication:  -Please take Lantus 20units daily at bedtime -We increased your dose of gabapentin to 2400mg  daily. Please take one tablet in the morning, one tablet in the afternoon, and two tablets at nighttime.  -Please follow up in our clinic in one month   Diabetic Neuropathy Diabetic neuropathy is a nerve disease or nerve damage that is caused by diabetes mellitus. About half of all people with diabetes mellitus have some form of nerve damage. Nerve damage is more common in those who have had diabetes mellitus for many years and who generally have not had good control of their blood sugar (glucose) level. Diabetic neuropathy is a common complication of diabetes mellitus. There are three common types of diabetic neuropathy and a fourth type that is less common and less understood:  Peripheral neuropathy-This is the most common type of diabetic neuropathy. It causes damage to the nerves of the feet and legs first and then eventually the hands and arms. The damage affects the ability to sense touch.  Autonomic neuropathy-This type causes damage to the autonomic nervous system, which controls the following functions: ? Heartbeat. ? Body temperature. ? Blood pressure. ? Urination. ? Digestion. ? Sweating. ? Sexual function.  Focal neuropathy-Focal neuropathy can be painful and unpredictable and occurs most often in older adults with diabetes mellitus. It involves a specific nerve or one area and often comes on suddenly. It usually does not cause long-term problems.  Radiculoplexus neuropathy- Sometimes called lumbosacral radiculoplexus neuropathy, radiculoplexus neuropathy affects the nerves of the thighs, hips, buttocks, or legs. It is more common in people with type 2 diabetes mellitus and in older  men. It is characterized by debilitating pain, weakness, and atrophy, usually in the thigh muscles.  What are the causes? The cause of peripheral, autonomic, and focal neuropathies is diabetes mellitus that is uncontrolled and high glucose levels. The cause of radiculoplexus neuropathy is unknown. However, it is thought to be caused by inflammation related to uncontrolled glucose levels. What are the signs or symptoms? Peripheral Neuropathy Peripheral neuropathy develops slowly over time. When the nerves of the feet and legs no longer work there may be:  Burning, stabbing, or aching pain in the legs or feet.  Inability to feel pressure or pain in your feet. This can lead to: ? Thick calluses over pressure areas. ? Pressure sores. ? Ulcers.  Foot deformities.  Reduced ability to feel temperature changes.  Muscle weakness.  Autonomic Neuropathy The symptoms of autonomic neuropathy vary depending on which nerves are affected. Symptoms may include:  Problems with digestion, such as: ? Feeling sick to your stomach (nausea). ? Vomiting. ? Bloating. ? Constipation. ? Diarrhea. ? Abdominal pain.  Difficulty with urination. This occurs if you lose your ability to sense when your bladder is full. Problems include: ? Urine leakage (incontinence). ? Inability to empty your bladder completely (retention).  Rapid or irregular heartbeat (palpitations).  Blood pressure drops when you stand up (orthostatic hypotension). When you stand up you may feel: ? Dizzy. ? Weak. ? Faint.  In men, inability to attain and maintain an erection.  In women, vaginal dryness and problems with decreased sexual desire and arousal.  Problems with body temperature regulation.  Increased or decreased sweating.  Focal Neuropathy  Abnormal eye movements or  abnormal alignment of both eyes.  Weakness in the wrist.  Foot drop. This results in an inability to lift the foot properly and abnormal walking or  foot movement.  Paralysis on one side of your face (Bell palsy).  Chest or abdominal pain. Radiculoplexus Neuropathy  Sudden, severe pain in your hip, thigh, or buttocks.  Weakness and wasting of thigh muscles.  Difficulty rising from a seated position.  Abdominal swelling.  Unexplained weight loss (usually more than 10 lb [4.5 kg]). How is this diagnosed? Peripheral Neuropathy Your senses may be tested. Sensory function testing can be done with:  A light touch using a monofilament.  A vibration with tuning fork.  A sharp sensation with a pin prick.  Other tests that can help diagnose neuropathy are:  Nerve conduction velocity. This test checks the transmission of an electrical current through a nerve.  Electromyography. This shows how muscles respond to electrical signals transmitted by nearby nerves.  Quantitative sensory testing. This is used to assess how your nerves respond to vibrations and changes in temperature.  Autonomic Neuropathy Diagnosis is often based on reported symptoms. Tell your health care provider if you experience:  Dizziness.  Constipation.  Diarrhea.  Inappropriate urination or inability to urinate.  Inability to get or maintain an erection.  Tests that may be done include:  Electrocardiography or Holter monitor. These are tests that can help show problems with the heart rate or heart rhythm.  An X-ray exam may be done.  Focal Neuropathy Diagnosis is made based on your symptoms and what your health care provider finds during your exam. Other tests may be done. They may include:  Nerve conduction velocities. This checks the transmission of electrical current through a nerve.  Electromyography. This shows how muscles respond to electrical signals transmitted by nearby nerves.  Quantitative sensory testing. This test is used to assess how your nerves respond to vibration and changes in temperature.  Radiculoplexus Neuropathy  Often  the first thing is to eliminate any other issue or problems that might be the cause, as there is no standard test for diagnosis.  X-ray exam of your spine and lumbar region.  Spinal tap to rule out cancer.  MRI to rule out other lesions. How is this treated? Once nerve damage occurs, it cannot be reversed. The goal of treatment is to keep the disease or nerve damage from getting worse and affecting more nerve fibers. Controlling your blood glucose level is the key. Most people with radiculoplexus neuropathy see at least a partial improvement over time. You will need to keep your blood glucose and HbA1c levels in the target range determined by your health care provider. Things that help control blood glucose levels include:  Blood glucose monitoring.  Meal planning.  Physical activity.  Diabetes medicine.  Over time, maintaining lower blood glucose levels helps lessen symptoms. Sometimes, prescription pain medicine is needed. Follow these instructions at home:  Do not smoke.  Keep your blood glucose level in the range that you and your health care provider have determined acceptable for you.  Keep your blood pressure level in the range that you and your health care provider have determined acceptable for you.  Eat a well-balanced diet.  Be physically active every day. Include strength training and balance exercises.  Protect your feet. ? Check your feet every day for sores, cuts, blisters, or signs of infection. ? Wear padded socks and supportive shoes. Use orthotic inserts, if necessary. ? Regularly check the insides of  your shoes for worn spots. Make sure there are no rocks or other items inside your shoes before you put them on. Contact a health care provider if:  You have burning, stabbing, or aching pain in the legs or feet.  You are unable to feel pressure or pain in your feet.  You develop problems with digestion such  as: ? Nausea. ? Vomiting. ? Bloating. ? Constipation. ? Diarrhea. ? Abdominal pain.  You have difficulty with urination, such as: ? Incontinence. ? Retention.  You have palpitations.  You develop orthostatic hypotension. When you stand up you may feel: ? Dizzy. ? Weak. ? Faint.  You cannot attain and maintain an erection (in men).  You have vaginal dryness and problems with decreased sexual desire and arousal (in women).  You have severe pain in your thighs, legs, or buttocks.  You have unexplained weight loss. This information is not intended to replace advice given to you by your health care provider. Make sure you discuss any questions you have with your health care provider. Document Released: 05/01/2001 Document Revised: 07/29/2015 Document Reviewed: 08/01/2012 Elsevier Interactive Patient Education  2017 [employer].

## 2016-12-11 NOTE — Progress Notes (Signed)
z`  CC: Pain in feet  HPI:  [name] is a 46 y.o. with pmh of diabetes mellitus, vitamin d deficiency, fibromyalgia, asthma who presented to the clinic for bilateral foot pain. Please see assessment and plan for additional details.   Past Medical History:  Diagnosis Date  . Asthma   . Chronic rhinitis   . Depression   . Diabetes mellitus   . Episodic recurrent vertigo   . Fibromyalgia   . History of hip fracture    hx childhood hip fracture  . Hyperlipidemia   . Obesity   . Peripheral neuropathy   . Rheumatoid arthritis(714.0)    seen by Rheumatology in [facility], Rheumatoid Factor pos, Anti CCP pos in 2005 -? early presentation of RF  . Tinnitus    history of, Evaluated by ENT> [place]  . Vitamin D deficiency    history of   Review of Systems:    Review of Systems  Constitutional: Negative for chills and fever.  Eyes: Positive for blurred vision and double vision.  Cardiovascular: Positive for chest pain and palpitations.  Gastrointestinal: Negative for nausea and vomiting.  Genitourinary: Positive for frequency and urgency. Negative for dysuria.  Neurological: Positive for dizziness, tingling, sensory change and headaches.    Physical Exam:  Vitals:   12/11/16 1602  BP: 123/88  Pulse: 96  Temp: 98.4 F (36.9 C)  TempSrc: Oral  SpO2: 99%  Weight: 149 lb (67.6 kg)  Height: 5\' 4"  (1.626 m)   Physical Exam  Constitutional: She appears well-developed and well-nourished. No distress.  HENT:  Head: Normocephalic and atraumatic.  Cardiovascular: Normal rate, regular rhythm and normal heart sounds.   Pulmonary/Chest: Breath sounds normal. No respiratory distress.  Abdominal: Soft. Bowel sounds are normal. She exhibits no distension. There is no tenderness.  Musculoskeletal: Normal range of motion. She exhibits tenderness. She exhibits no edema.       Right ankle: She exhibits normal range of motion, no swelling, no deformity and no laceration.  Tenderness.       Feet:  Tenderness to palpation over the metatarsal region bilaterally and bilateral arches  Psychiatric: She has a normal mood and affect. Her behavior is normal. Judgment and thought content normal.   Assessment & Plan:   See Encounters Tab for problem based charting.  Patient seen with Dr. Heber [place]

## 2016-12-12 NOTE — Progress Notes (Signed)
Internal Medicine Clinic Attending  I saw and evaluated the patient.  I personally confirmed the key portions of the history and exam documented by Dr. Maricela Bo and I reviewed pertinent patient test results.  The assessment, diagnosis, and plan were formulated together and I agree with the documentation in the resident's note. Patient had resistance to the diagnosis of diabetic neuropathy however hopefully Dr Maricela Bo and myself were able to make some headway in helping her understand her diabetes and the importance of glycemic control.  She was honest with [name] that she does not routinely administer her lantus she reports this is due to rationing.  In review of her DM management from 2013 to now it appears to me that her lantus was increased from 13units daily, to 30 to 40, to 45 then 50units daily without there ever being a change in her glycemic control.  I suspect these changes were made on the assumption that she was adherent to the previous insulin dose.  As her weight is about 150 lbs I suspect 50 units may be too high of a basal dose.  We have provided her lantus pens with enough for her to take 20 units daily until her follow up appointment in 1 month.  She hopes to be insured at that time.

## 2017-01-08 ENCOUNTER — Encounter: Payer: Self-pay | Admitting: Internal Medicine

## 2017-01-11 ENCOUNTER — Ambulatory Visit: Payer: Self-pay

## 2017-03-20 ENCOUNTER — Other Ambulatory Visit: Payer: Self-pay

## 2017-03-20 ENCOUNTER — Ambulatory Visit: Payer: BLUE CROSS/BLUE SHIELD | Admitting: Internal Medicine

## 2017-03-20 VITALS — BP 122/74 | HR 91 | Temp 98.1°F | Ht 66.0 in | Wt 152.9 lb

## 2017-03-20 DIAGNOSIS — Z794 Long term (current) use of insulin: Secondary | ICD-10-CM

## 2017-03-20 DIAGNOSIS — E1142 Type 2 diabetes mellitus with diabetic polyneuropathy: Secondary | ICD-10-CM | POA: Diagnosis not present

## 2017-03-20 NOTE — Progress Notes (Signed)
   CC: Follow-up on bilateral foot pain  HPI:  Adriana Burke is a 47 y.o. female with history noted below presents the acute care clinic for follow-up on bilateral foot pain. Please see problem based charting for the status of patient's chronic medical condition.  Past Medical History:  Diagnosis Date  . Asthma   . Chronic rhinitis   . Depression   . Diabetes mellitus   . Episodic recurrent vertigo   . Fibromyalgia   . History of hip fracture    hx childhood hip fracture  . Hyperlipidemia   . Obesity   . Peripheral neuropathy   . Rheumatoid arthritis(714.0)    seen by Rheumatology in [facility], Rheumatoid Factor pos, Anti CCP pos in 2005 -? early presentation of RF  . Tinnitus    history of, Evaluated by ENT> [place]  . Vitamin D deficiency    history of    Review of Systems:  Review of Systems  Constitutional: Negative for chills and fever.  Cardiovascular: Negative for leg swelling.  Skin: Negative for rash.  Neurological: Negative for focal weakness.     Physical Exam:  Vitals:   03/20/17 1558  BP: 122/74  Pulse: 91  Temp: 98.1 F (36.7 C)  TempSrc: Oral  SpO2: 100%  Weight: 152 lb 14.4 oz (69.4 kg)  Height: 5\' 6"  (1.676 m)   Physical Exam  Constitutional: She is well-developed, well-nourished, and in no distress.  Musculoskeletal: She exhibits no edema.  Skin: Skin is warm and dry. No rash noted. No erythema.  2+ pedal pulses intact bilaterally Sensation intact to feet bilaterally     Assessment & Plan:   See encounters tab for problem based medical decision making.   Patient discussed with Dr. Evette Doffing

## 2017-03-20 NOTE — Patient Instructions (Addendum)
[name],  Please start taking gabapentin to help with your pain. It is also important to control your diabetes with insulin. Please follow up with your primary care doctor.

## 2017-03-20 NOTE — Assessment & Plan Note (Signed)
Assessment: Bilateral foot pain Patient describes her foot pain as burning and sharp and has been going on for 7 months. It is in a stocking distribution. Patient is an uncontrolled diabetic, last hemoglobin A1C 13.9. Patient has been without her gabapentin for 2 months due to not having insurance.  Patient's presentation is consistent with diabetic neuropathy.  She states she is now able to afford her medications. I recommended she restart gabapentin and continue to use insulin to help control her diabetes. Will also obtain labs to rule out other potential causes for peripheral neuropathy.  Plan -restart gabapentin -Tsh, b12 rpr, vitamin D, HIV - follow up with PCP

## 2017-03-21 LAB — VITAMIN B12: VITAMIN B 12: 374 pg/mL (ref 232–1245)

## 2017-03-21 LAB — HIV ANTIBODY (ROUTINE TESTING W REFLEX): HIV SCREEN 4TH GENERATION: NONREACTIVE

## 2017-03-21 LAB — RPR: RPR Ser Ql: NONREACTIVE

## 2017-03-21 LAB — VITAMIN D 25 HYDROXY (VIT D DEFICIENCY, FRACTURES): VIT D 25 HYDROXY: 9.4 ng/mL — AB (ref 30.0–100.0)

## 2017-03-21 LAB — TSH: TSH: 1.72 u[IU]/mL (ref 0.450–4.500)

## 2017-03-22 NOTE — Progress Notes (Signed)
Internal Medicine Clinic Attending  Case discussed with Dr. Hoffman at the time of the visit.  We reviewed the resident's history and exam and pertinent patient test results.  I agree with the assessment, diagnosis, and plan of care documented in the resident's note.  

## 2017-03-27 ENCOUNTER — Telehealth: Payer: Self-pay | Admitting: Internal Medicine

## 2017-03-27 NOTE — Telephone Encounter (Signed)
PATIENT WANTS RESULTS FROM TESTS THAT WERE DONE LAST THURSDAY

## 2017-03-28 ENCOUNTER — Other Ambulatory Visit: Payer: Self-pay | Admitting: Internal Medicine

## 2017-03-28 MED ORDER — VITAMIN D (CHOLECALCIFEROL) 25 MCG (1000 UT) PO TABS: 1000.0000 [IU] | ORAL_TABLET | Freq: Every day | ORAL | 1 refills | Status: DC

## 2017-03-28 NOTE — Telephone Encounter (Signed)
Could you please let Adriana Burke know that her vitamin D level was low.  I sent a prescription to the pharmacy to take vitamin D daily.  Her RPR, HIV were non reactive.  TSH and B12 were within normal limits

## 2017-04-02 NOTE — Telephone Encounter (Signed)
Returned pt's call - no answer; left message to call us back. 

## 2017-04-02 NOTE — Telephone Encounter (Signed)
PT is calling back to speak with a nurse. Please call back.

## 2017-04-02 NOTE — Telephone Encounter (Signed)
Called pt - no answer; left message to give us a call back. 

## 2017-04-04 NOTE — Telephone Encounter (Signed)
Called pt - no answer; left message to give us a call back. 

## 2017-04-04 NOTE — Telephone Encounter (Signed)
Pt informed of tests result - informed "her vitamin D level was low.  I sent a prescription to the pharmacy to take vitamin D daily.  Her RPR, HIV were non reactive.  TSH and B12 were within normal limits' per Dr Magdalene River. Voiced understanding. Also stated she has insurance and would like to scheduled an appt for "total check-up".

## 2017-04-18 ENCOUNTER — Other Ambulatory Visit: Payer: Self-pay

## 2017-04-18 NOTE — Telephone Encounter (Signed)
Insulin Glargine (LANTUS SOLOSTAR) 100 UNIT/ML Solostar Pen, [facility] request @ [facility] [address].

## 2017-04-20 ENCOUNTER — Telehealth: Payer: Self-pay | Admitting: Internal Medicine

## 2017-04-20 NOTE — Telephone Encounter (Signed)
Patient needs a refill on insulins, pls call pat. She been without her insulin, she now have insurance

## 2017-04-21 MED ORDER — INSULIN GLARGINE 100 UNIT/ML SOLOSTAR PEN: 20.0000 [IU] | PEN_INJECTOR | Freq: Every day | SUBCUTANEOUS | 1 refills | Status: DC

## 2017-04-21 NOTE — Telephone Encounter (Signed)
refilled 

## 2017-04-23 ENCOUNTER — Ambulatory Visit (INDEPENDENT_AMBULATORY_CARE_PROVIDER_SITE_OTHER): Payer: BLUE CROSS/BLUE SHIELD

## 2017-04-23 ENCOUNTER — Encounter (INDEPENDENT_AMBULATORY_CARE_PROVIDER_SITE_OTHER): Payer: Self-pay | Admitting: Orthopedic Surgery

## 2017-04-23 ENCOUNTER — Ambulatory Visit (INDEPENDENT_AMBULATORY_CARE_PROVIDER_SITE_OTHER): Payer: BLUE CROSS/BLUE SHIELD | Admitting: Orthopedic Surgery

## 2017-04-23 DIAGNOSIS — M79671 Pain in right foot: Secondary | ICD-10-CM | POA: Diagnosis not present

## 2017-04-23 DIAGNOSIS — M79672 Pain in left foot: Secondary | ICD-10-CM

## 2017-04-23 NOTE — Progress Notes (Signed)
Office Visit Note   Patient: Adriana Burke           Date of Birth: May 13, 1970           MRN: [id number] Visit Date: 04/23/2017 Requested by: Katherine Roan, MD [place], [place] [id number] PCP: System, Pcp Not In  Subjective: Chief Complaint  Patient presents with  . Right Foot - Pain  . Left Foot - Pain    HPI: Adriana Burke is a 47 year old patient with bilateral foot pain.  She does have diabetes.  She states that her hemoglobin A1c has been high but she just got insurance and so she is going to get some insulin.  She reports a feeling like she is walking on rocks.  She reports dorsal and plantar pain.  It is a burning and shocking type pain in both feet.  She is taking Neurontin for the problem.              ROS: All systems reviewed are negative as they relate to the chief complaint within the history of present illness.  Patient denies  fevers or chills.   Assessment & Plan: Visit Diagnoses:  1. Bilateral foot pain   2. Foot pain, bilateral     Plan: Impression is bilateral foot pain.  This looks like neuropathy.  Plain radiographs are normal.  Neurontin is a good idea.  She should consider adding B complex vitamins to help with some of this.  She also has a mole on the plantar aspect of the left foot which measures about 4 x 4 mm.  No concerning features but that plantar mole which is generally dark I think needs dermatologic evaluation.  Follow-up with me as needed.  Follow-Up Instructions: No Follow-up on file.   Orders:  Orders Placed This Encounter  Procedures  . XR Foot Complete Left  . XR Foot Complete Right   No orders of the defined types were placed in this encounter.     Procedures: No procedures performed   Clinical Data: No additional findings.  Objective: Vital Signs: There were no vitals taken for this visit.  Physical Exam:   Constitutional: Patient appears well-developed HEENT:  Head: Normocephalic Eyes:EOM are normal Neck: Normal  range of motion Cardiovascular: Normal rate Pulmonary/chest: Effort normal Neurologic: Patient is alert Skin: Skin is warm Psychiatric: Patient has normal mood and affect    Ortho Exam: Orthopedic exam demonstrates normal gait and alignment.  Pedal pulses palpable.  Patient does have intact sensation on the dorsal and plantar aspect of the foot.  The skin is intact on both sides.  She has a 4 x 4 millimeter plantar mole on the left foot.  No real surrounding peripheral redness or enhancement.  Patient has palpable intact nontender anterior to posterior to peroneal and Achilles tendons.  No edema in the lower extremities.  Specialty Comments:  No specialty comments available.  Imaging: Xr Foot Complete Left  Result Date: 04/23/2017 AP lateral and oblique left foot reviewed.  Tarsometatarsal articulation intact.  No significant degenerative changes noted.  No fracture or dislocation seen.  Normal left foot.  Xr Foot Complete Right  Result Date: 04/23/2017 AP lateral oblique right foot reviewed.  Tarsometatarsal articulation and alignment normal.  No significant degenerative changes present.  No fracture or dislocation seen.  Normal right foot.    PMFS History: Patient Active Problem List   Diagnosis Date Noted  . Acute cystitis without hematuria 09/16/2014  . Encounter for immunization  02/24/2014  . Fibrocystic changes of right breast 12/03/2013  . Bilateral hip pain 10/14/2013  . Alopecia areata 09/16/2013  . Impaired hearing rt ear 01/02/2013  . Abnormality of gait 06/12/2012  . Osteoarthritis of left hip 05/31/2011  . Chronic generalized pain 04/01/2011  . Diabetic polyneuropathy ([place]) 02/24/2010  . FIBROMYALGIA 10/29/2009  . WRIST PAIN, BILATERAL 11/16/2008  . Vitamin D deficiency 07/24/2008  . RHEUMATOID ARTHRITIS 04/17/2008  . HYPERLIPIDEMIA 04/08/2008  . Type II diabetes mellitus with manifestations, uncontrolled ([place]) 01/08/2008  . KNEE PAIN, BILATERAL 08/23/2006  .  DEPRESSION 04/10/2006  . ASTHMA 01/08/2006   Past Medical History:  Diagnosis Date  . Asthma   . Chronic rhinitis   . Depression   . Diabetes mellitus   . Episodic recurrent vertigo   . Fibromyalgia   . History of hip fracture    hx childhood hip fracture  . Hyperlipidemia   . Obesity   . Peripheral neuropathy   . Rheumatoid arthritis(714.0)    seen by Rheumatology in [facility], Rheumatoid Factor pos, Anti CCP pos in 2005 -? early presentation of RF  . Tinnitus    history of, Evaluated by ENT> [place]  . Vitamin D deficiency    history of    Family History  Problem Relation Age of Onset  . Hypertension Mother   . Diabetes Maternal Grandmother   . Arthritis Maternal Grandmother   . Kidney disease Maternal Grandmother     Past Surgical History:  Procedure Laterality Date  . CESAREAN SECTION     Social History   Occupational History    Employer: Restaurant manager, [employer] retirement  Tobacco Use  . Smoking status: Never Smoker  . Smokeless tobacco: Never Used  Substance and Sexual Activity  . Alcohol use: Yes    Alcohol/week: 0.0 oz    Comment: occasionally  . Drug use: No  . Sexual activity: Not on file

## 2017-04-23 NOTE — Addendum Note (Signed)
Addended byLaurann Montana on: 04/23/2017 10:40 AM   Modules accepted: Orders

## 2017-04-23 NOTE — Telephone Encounter (Signed)
This has been answered in another encounter

## 2017-04-30 ENCOUNTER — Telehealth (INDEPENDENT_AMBULATORY_CARE_PROVIDER_SITE_OTHER): Payer: Self-pay | Admitting: Orthopedic Surgery

## 2017-04-30 NOTE — Telephone Encounter (Signed)
Please advise. Thanks.  

## 2017-04-30 NOTE — Telephone Encounter (Signed)
Narcotics not indicated for neuropathy I would go with over-the-counter Tylenol or Tylenol arthritis

## 2017-04-30 NOTE — Telephone Encounter (Signed)
Patient requesting something be called in for pain, she saw Dr. Marlou Sa for bilateral feet pain. [facility] [address] Please advise # [phone]

## 2017-04-30 NOTE — Telephone Encounter (Signed)
IC advised.  

## 2017-05-02 ENCOUNTER — Ambulatory Visit (INDEPENDENT_AMBULATORY_CARE_PROVIDER_SITE_OTHER): Payer: BLUE CROSS/BLUE SHIELD | Admitting: Orthopedic Surgery

## 2017-05-02 ENCOUNTER — Encounter (INDEPENDENT_AMBULATORY_CARE_PROVIDER_SITE_OTHER): Payer: Self-pay | Admitting: Orthopedic Surgery

## 2017-05-02 DIAGNOSIS — M25571 Pain in right ankle and joints of right foot: Secondary | ICD-10-CM

## 2017-05-02 DIAGNOSIS — M25572 Pain in left ankle and joints of left foot: Secondary | ICD-10-CM | POA: Diagnosis not present

## 2017-05-02 DIAGNOSIS — R2231 Localized swelling, mass and lump, right upper limb: Secondary | ICD-10-CM

## 2017-05-02 MED ORDER — NABUMETONE 500 MG PO TABS: 500.0000 mg | ORAL_TABLET | Freq: Two times a day (BID) | ORAL | 0 refills | Status: DC

## 2017-05-02 NOTE — Progress Notes (Signed)
Office Visit Note   Patient: Adriana Burke           Date of Birth: [birth year]           MRN: [id number] Visit Date: 05/02/2017 Requested by: No referring provider defined for this encounter. PCP: System, Pcp Not In  Subjective: Chief Complaint  Patient presents with  . Right Hand - Pain    HPI: Adriana Burke is a patient with right thumb not.'s been there for 5 months.  States it is very painful.  Reports itching and burning in the thumb.  She is right-hand dominant.  Patient also reports persistent right foot and left ankle pain.  Plain radiographs last clinic visit were normal.  Does not really seem like a neuropathy type situation.  She reports primarily sesamoid type pain on the right.  On the left ankle she has pain along the plantar aspect of the foot.  No real tingling.  She does describe having a leg bite on the left-hand side months ago which turned black.  It did eventually improve but the possibility of autoimmune spondyloarthropathy needs to be investigated.  She has taken multiple medical tablets for the problem including Norco ibuprofen Neurontin gabapentin and tramadol.  None of these have worked.  She has seen a rheumatologist over 10 years ago.              ROS: All systems reviewed are negative as they relate to the chief complaint within the history of present illness.  Patient denies  fevers or chills.   Assessment & Plan: Visit Diagnoses:  1. Pain in right ankle and joints of right foot   2. Pain in left ankle and joints of left foot   3. Mass of finger of right hand     Plan: Impression is right foot and left ankle pain with normal exam and radiographs but persistent symptoms giving her level 9 out of 10 pain on a daily basis.  No real sensory changes but pain is present.  For this I think it is reasonable to get an MRI of the right foot to evaluate that sesamoid area and MRI of the left ankle to evaluate for what ever was being drained on multiple occasions by her  previous foot and ankle doctor.  I will also try her on Relafen 500 mg twice a day.  Also would like to send her to dermatology to have this plantar mole evaluated on the left-hand side.  She also needs to see a rheumatologist to evaluate the possibility of autoimmune spondyloarthropathy.  In regards to the mass on the thumb this is freely mobile and measures about 8 x 8 mm.  Epidermal inclusion cyst is a possibility does not look like squamous cell carcinoma.  Plan excision with primary closure.  Risks and benefits are discussed.  All questions answered.  The edges are definitely smooth and rounded and not flaky.  Follow-Up Instructions: No Follow-up on file.   Orders:  Orders Placed This Encounter  Procedures  . MR Foot Right w/o contrast  . MR Ankle Left w/o contrast  . Ambulatory referral to Rheumatology   Meds ordered this encounter  Medications  . nabumetone (RELAFEN) 500 MG tablet    Sig: Take 1 tablet (500 mg total) by mouth 2 (two) times daily.    Dispense:  60 tablet    Refill:  0      Procedures: No procedures performed   Clinical Data: No additional findings.  Objective: Vital  Signs: There were no vitals taken for this visit.  Physical Exam:   Constitutional: Patient appears well-developed HEENT:  Head: Normocephalic Eyes:EOM are normal Neck: Normal range of motion Cardiovascular: Normal rate Pulmonary/chest: Effort normal Neurologic: Patient is alert Skin: Skin is warm Psychiatric: Patient has normal mood and affect    Ortho Exam: Orthopedic exam of the right foot demonstrates exquisite tenderness to palpation of the sesamoids.  Toe flexor and extensors are intact..  Pedal pulses palpable.  No other masses lymphadenopathy or skin changes noted in that right foot region.  Right thumb is examined.  EPL FPL function is intact.  Collateral UCL and radial collateral ligaments intact.  Patient has about an 8 x 8 mm rounded raised epidermal mass which is  non-fluctuant.  It is slightly tender to palpation.  Freely mobile without attachment to underlying fascia.  Tendon excursion is normal.  The masses over the MCP joint of the thumb.  Specialty Comments:  No specialty comments available.  Imaging: No results found.   PMFS History: Patient Active Problem List   Diagnosis Date Noted  . Acute cystitis without hematuria 09/16/2014  . Encounter for immunization 02/24/2014  . Fibrocystic changes of right breast 12/03/2013  . Bilateral hip pain 10/14/2013  . Alopecia areata 09/16/2013  . Impaired hearing rt ear 01/02/2013  . Abnormality of gait 06/12/2012  . Osteoarthritis of left hip 05/31/2011  . Chronic generalized pain 04/01/2011  . Diabetic polyneuropathy ([place]) 02/24/2010  . FIBROMYALGIA 10/29/2009  . WRIST PAIN, BILATERAL 11/16/2008  . Vitamin D deficiency 07/24/2008  . RHEUMATOID ARTHRITIS 04/17/2008  . HYPERLIPIDEMIA 04/08/2008  . Type II diabetes mellitus with manifestations, uncontrolled ([place]) 01/08/2008  . KNEE PAIN, BILATERAL 08/23/2006  . DEPRESSION 04/10/2006  . ASTHMA 01/08/2006   Past Medical History:  Diagnosis Date  . Asthma   . Chronic rhinitis   . Depression   . Diabetes mellitus   . Episodic recurrent vertigo   . Fibromyalgia   . History of hip fracture    hx childhood hip fracture  . Hyperlipidemia   . Obesity   . Peripheral neuropathy   . Rheumatoid arthritis(714.0)    seen by Rheumatology in [place], Rheumatoid Factor pos, Anti CCP pos in 2005 -? early presentation of RF  . Tinnitus    history of, Evaluated by ENT> [place]  . Vitamin D deficiency    history of    Family History  Problem Relation Age of Onset  . Hypertension Mother   . Diabetes Maternal Grandmother   . Arthritis Maternal Grandmother   . Kidney disease Maternal Grandmother     Past Surgical History:  Procedure Laterality Date  . CESAREAN SECTION     Social History   Occupational History    Employer: Restaurant manager, [employer]  retirement  Tobacco Use  . Smoking status: Never Smoker  . Smokeless tobacco: Never Used  Substance and Sexual Activity  . Alcohol use: Yes    Alcohol/week: 0.0 oz    Comment: occasionally  . Drug use: No  . Sexual activity: Not on file

## 2017-05-11 ENCOUNTER — Telehealth (INDEPENDENT_AMBULATORY_CARE_PROVIDER_SITE_OTHER): Payer: Self-pay | Admitting: Orthopedic Surgery

## 2017-05-11 NOTE — Telephone Encounter (Signed)
ok 

## 2017-05-11 NOTE — Telephone Encounter (Signed)
Adriana Burke called this morning to cancel surgery.  She was scheduled for excision of her right thumb mass on Monday 05/14/2017.  She stated that her feet were bothering her more than her thumb, so she has decided to have them looked at and hold on the mass excision for right now.

## 2017-05-16 ENCOUNTER — Ambulatory Visit
Admission: RE | Admit: 2017-05-16 | Discharge: 2017-05-16 | Disposition: A | Discharge status: Home / Self Care | Payer: BLUE CROSS/BLUE SHIELD | Source: Ambulatory Visit | Attending: Orthopedic Surgery | Admitting: Orthopedic Surgery

## 2017-05-16 DIAGNOSIS — M25572 Pain in left ankle and joints of left foot: Secondary | ICD-10-CM

## 2017-05-16 DIAGNOSIS — M25571 Pain in right ankle and joints of right foot: Secondary | ICD-10-CM

## 2017-05-23 ENCOUNTER — Ambulatory Visit (INDEPENDENT_AMBULATORY_CARE_PROVIDER_SITE_OTHER): Payer: BLUE CROSS/BLUE SHIELD | Admitting: Orthopedic Surgery

## 2017-05-23 ENCOUNTER — Encounter (INDEPENDENT_AMBULATORY_CARE_PROVIDER_SITE_OTHER): Payer: Self-pay | Admitting: Orthopedic Surgery

## 2017-05-23 DIAGNOSIS — M25571 Pain in right ankle and joints of right foot: Secondary | ICD-10-CM

## 2017-05-26 ENCOUNTER — Encounter (INDEPENDENT_AMBULATORY_CARE_PROVIDER_SITE_OTHER): Payer: Self-pay | Admitting: Orthopedic Surgery

## 2017-05-26 NOTE — Progress Notes (Signed)
Office Visit Note   Patient: Adriana Burke           Date of Birth: [birth year]           MRN: [id number] Visit Date: 05/23/2017 Requested by: No referring provider defined for this encounter. PCP: System, Pcp Not In  Subjective: Chief Complaint  Patient presents with  . Follow-up    MRI review    HPI: Rahima is a patient with bilateral foot pain who presents for evaluation after MRI scan of the left ankle and MRI scan of the right foot.  Patient has diabetes which is relatively poorly controlled and she has a known diagnosis of neuropathy.  She also takes vitamin D.  She reports continued pain in both feet.  MRI scan of the left ankle was essentially normal.  MRI scan of the right foot does show a stress fracture of the lateral right toe sesamoid.  I discussed with her treatment options for this which would be essentially limited activity which she cannot under any circumstances entertain doing.  She cannot really take any time off from ambulating on that right foot in particular.  Most of her symptoms do appear to be neuropathic in origin.              ROS: All systems reviewed are negative as they relate to the chief complaint within the history of present illness.  Patient denies  fevers or chills.   Assessment & Plan: Visit Diagnoses:  1. Pain in right ankle and joints of right foot     Plan: Impression is bilateral foot pain likely from neuropathy.  Left ankle MRI scan normal.  Patient does have stress fracture of the lateral sesamoid but she states that mostly all of her toes are hurting and she has shooting burning type pain.  I do not think there is too much to be done for this particular problem other than consider diminished activity on that right foot and also getting blood glucose under optimal control.  I will see her back as needed.  Follow-Up Instructions: Return if symptoms worsen or fail to improve.   Orders:  No orders of the defined types were placed in this  encounter.  No orders of the defined types were placed in this encounter.     Procedures: No procedures performed   Clinical Data: No additional findings.  Objective: Vital Signs: There were no vitals taken for this visit.  Physical Exam:   Constitutional: Patient appears well-developed HEENT:  Head: Normocephalic Eyes:EOM are normal Neck: Normal range of motion Cardiovascular: Normal rate Pulmonary/chest: Effort normal Neurologic: Patient is alert Skin: Skin is warm Psychiatric: Patient has normal mood and affect    Ortho Exam: Orthopedic exam demonstrates relatively normal gait.  She has palpable pedal pulses.  Good range of motion of the ankles and knees.  Generally painful feet to palpation dorsally and plantarly.  Does have tenderness to palpation around the right great toe region.  Not much in the way of swelling in either foot.  Globally diminished sensation consistent with neuropathy present.  Specialty Comments:  No specialty comments available.  Imaging: No results found.   PMFS History: Patient Active Problem List   Diagnosis Date Noted  . Acute cystitis without hematuria 09/16/2014  . Encounter for immunization 02/24/2014  . Fibrocystic changes of right breast 12/03/2013  . Bilateral hip pain 10/14/2013  . Alopecia areata 09/16/2013  . Impaired hearing rt ear 01/02/2013  . Abnormality of gait 06/12/2012  .  Osteoarthritis of left hip 05/31/2011  . Chronic generalized pain 04/01/2011  . Diabetic polyneuropathy ([place]) 02/24/2010  . FIBROMYALGIA 10/29/2009  . WRIST PAIN, BILATERAL 11/16/2008  . Vitamin D deficiency 07/24/2008  . RHEUMATOID ARTHRITIS 04/17/2008  . HYPERLIPIDEMIA 04/08/2008  . Type II diabetes mellitus with manifestations, uncontrolled ([employer]) 01/08/2008  . KNEE PAIN, BILATERAL 08/23/2006  . DEPRESSION 04/10/2006  . ASTHMA 01/08/2006   Past Medical History:  Diagnosis Date  . Asthma   . Chronic rhinitis   . Depression   .  Diabetes mellitus   . Episodic recurrent vertigo   . Fibromyalgia   . History of hip fracture    hx childhood hip fracture  . Hyperlipidemia   . Obesity   . Peripheral neuropathy   . Rheumatoid arthritis(714.0)    seen by Rheumatology in [facility], Rheumatoid Factor pos, Anti CCP pos in 2005 -? early presentation of RF  . Tinnitus    history of, Evaluated by ENT> [place]  . Vitamin D deficiency    history of    Family History  Problem Relation Age of Onset  . Hypertension Mother   . Diabetes Maternal Grandmother   . Arthritis Maternal Grandmother   . Kidney disease Maternal Grandmother     Past Surgical History:  Procedure Laterality Date  . CESAREAN SECTION     Social History   Occupational History    Employer: Restaurant manager, [employer] retirement  Tobacco Use  . Smoking status: Never Smoker  . Smokeless tobacco: Never Used  Substance and Sexual Activity  . Alcohol use: Yes    Alcohol/week: 0.0 oz    Comment: occasionally  . Drug use: No  . Sexual activity: Not on file

## 2017-06-18 NOTE — Progress Notes (Signed)
Office Visit Note  Patient: Adriana Burke             Date of Birth: 08-24-1970           MRN: [id number]             PCP: System, Pcp Not In Referring: No ref. provider found Visit Date: 07/02/2017 Occupation: @GUAROCC @    Subjective:  Pain in multiple joints   History of Present Illness: Adriana Burke is a 46 y.o. female consultation per request of her PCP.  According to patient her symptoms started about 18 years ago in the postpartum.  She states that she started experiencing pain all over her body.  Over the years the pain got worse.  She states she has been going to the [facility] outpatient clinic for last several years and has tried different medications without help.  She has noticed pain and swelling in her bilateral feet.  She also has discomfort in her bilateral knee joints and hip joints.  She was told that she has osteoarthritis in her hips.  She was seen by [facility] orthopedics in the past for feet discomfort and had surgery left ankle joint.  She had no relief from the surgery.  She experiences stiffness in her hands.  She also has lower back pain off and on.  Activities of Daily Living:  Patient reports morning stiffness for 2 hours.   Patient Reports nocturnal pain.  Difficulty dressing/grooming: Denies Difficulty climbing stairs: Denies Difficulty getting out of chair: Denies Difficulty using hands for taps, buttons, cutlery, and/or writing: Denies   Review of Systems  Constitutional: Positive for fatigue. Negative for night sweats, weight gain and weight loss.  HENT: Negative for mouth sores, trouble swallowing, trouble swallowing, mouth dryness and nose dryness.   Eyes: Negative for pain, redness, visual disturbance and dryness.  Respiratory: Negative for cough, shortness of breath and difficulty breathing.   Cardiovascular: Negative for chest pain, palpitations, hypertension, irregular heartbeat and swelling in legs/feet.  Gastrointestinal: Negative for  blood in stool, constipation and diarrhea.  Endocrine: Negative for increased urination.  Genitourinary: Negative for vaginal dryness.  Musculoskeletal: Positive for arthralgias, joint pain, myalgias, morning stiffness and myalgias. Negative for joint swelling, muscle weakness and muscle tenderness.  Skin: Positive for hair loss. Negative for color change, rash, skin tightness, ulcers and sensitivity to sunlight.  Allergic/Immunologic: Negative for susceptible to infections.  Neurological: Negative for dizziness, memory loss, night sweats and weakness.  Hematological: Negative for swollen glands.  Psychiatric/Behavioral: Negative for depressed mood and sleep disturbance. The patient is not nervous/anxious.     PMFS History:  Patient Active Problem List   Diagnosis Date Noted  . Acute cystitis without hematuria 09/16/2014  . Encounter for immunization 02/24/2014  . Fibrocystic changes of right breast 12/03/2013  . Bilateral hip pain 10/14/2013  . Alopecia areata 09/16/2013  . Impaired hearing rt ear 01/02/2013  . Abnormality of gait 06/12/2012  . Osteoarthritis of left hip 05/31/2011  . Chronic generalized pain 04/01/2011  . Diabetic polyneuropathy ([place]) 02/24/2010  . FIBROMYALGIA 10/29/2009  . WRIST PAIN, BILATERAL 11/16/2008  . Vitamin D deficiency 07/24/2008  . RHEUMATOID ARTHRITIS 04/17/2008  . HYPERLIPIDEMIA 04/08/2008  . Type II diabetes mellitus with manifestations, uncontrolled ([place]) 01/08/2008  . KNEE PAIN, BILATERAL 08/23/2006  . DEPRESSION 04/10/2006  . ASTHMA 01/08/2006    Past Medical History:  Diagnosis Date  . Asthma   . Chronic rhinitis   . Depression   . Diabetes mellitus   .  Episodic recurrent vertigo   . Fibromyalgia   . History of hip fracture    hx childhood hip fracture  . Hyperlipidemia   . Obesity   . Peripheral neuropathy   . Rheumatoid arthritis(714.0)    seen by Rheumatology in [facility] Rheumatoid Factor pos, Anti CCP pos in 2005 -? early  presentation of RF  . Tinnitus    history of, Evaluated by ENT> [place]  . Vitamin D deficiency    history of    Family History  Problem Relation Age of Onset  . Hypertension Mother   . Diabetes Maternal Grandmother   . Arthritis Maternal Grandmother   . Kidney disease Maternal Grandmother    Past Surgical History:  Procedure Laterality Date  . CESAREAN SECTION     Social History   Social History Narrative   [phone]     Objective: Vital Signs: BP 115/73   Pulse 90   Resp 12   Ht 5\' 5"  (1.651 m)   Wt 154 lb (69.9 kg)   BMI 25.63 kg/m    Physical Exam  Constitutional: She is oriented to person, place, and time. She appears well-developed and well-nourished.  HENT:  Head: Normocephalic and atraumatic.  Eyes: Conjunctivae and EOM are normal.  Neck: Normal range of motion.  Cardiovascular: Normal rate, regular rhythm, normal heart sounds and intact distal pulses.  Pulmonary/Chest: Effort normal and breath sounds normal.  Abdominal: Soft. Bowel sounds are normal.  Lymphadenopathy:    She has no cervical adenopathy.  Neurological: She is alert and oriented to person, place, and time.  Skin: Skin is warm and dry. Capillary refill takes less than 2 seconds.  Psychiatric: She has a normal mood and affect. Her behavior is normal.  Nursing note and vitals reviewed.    Musculoskeletal Exam: C-spine thoracic lumbar spine good range of motion.  Shoulder joints elbow joints wrist joint MCPs PIPs DIPs were in good range of motion.  She has some tenderness across her PIP joints but no synovitis was noted.  She had painful range of motion of bilateral hip joints and painful range of motion of bilateral knee joints without any warmth swelling or effusion.  She has good range of motion of her MTPs PIPs and DIPs without synovitis.  She has some generalized hyperalgesia. CDAI Exam: No CDAI exam completed.    Investigation: No additional findings. CBC Latest Ref Rng & Units  06/17/2016 05/25/2014 09/24/2013  WBC 4.0 - 10.5 K/uL 5.4 5.6 4.1  Hemoglobin 12.0 - 15.0 g/dL 13.1 12.8 12.6  Hematocrit 36.0 - 46.0 % 39.4 38.2 40.1  Platelets 150 - 400 K/uL 222 207 217   CMP Latest Ref Rng & Units 06/17/2016 05/18/2015 05/25/2014  Glucose 65 - 99 mg/dL 419(H) 338(H) 322(H)  BUN 6 - 20 mg/dL 10 9 8   Creatinine 0.44 - 1.00 mg/dL 0.94 0.65 0.81  Sodium 135 - 145 mmol/L 129(L) 132(L) 132(L)  Potassium 3.5 - 5.1 mmol/L 4.1 4.1 3.4(L)  Chloride 101 - 111 mmol/L 93(L) 92(L) 98  CO2 22 - 32 mmol/L 24 20 27   Calcium 8.9 - 10.3 mg/dL 9.6 9.3 9.2  Total Protein 6.0 - 8.3 g/dL - - -  Total Bilirubin 0.2 - 1.2 mg/dL - - -  Alkaline Phos 39 - 117 U/L - - -  AST 0 - 37 U/L - - -  ALT 0 - 35 U/L - - -    Imaging: Xr Hip Unilat W Or W/o Pelvis 2-3 Views Left  Result  Date: 07/02/2017 No hip joint narrowing was noted.  No chondrocalcinosis was noted. Impression: Normal x-ray of the hip joint.  Xr Hip Unilat W Or W/o Pelvis 2-3 Views Right  Result Date: 07/02/2017 No hip joint narrowing was noted.  No chondrocalcinosis was noted. Impression: Normal x-ray of the hip joint.  Xr Hand 2 View Left  Result Date: 07/02/2017 No MCP, PIP, DIP, intercarpal joint space narrowing was noted.  No erosive changes were noted. Impression: Normal x-ray of the hand.  Xr Hand 2 View Right  Result Date: 07/02/2017 No MCP, PIP, DIP, intercarpal joint space narrowing was noted.  No erosive changes were noted. Impression: Normal x-ray of the hand.  Xr Knee 3 View Left  Result Date: 07/02/2017 No joint space narrowing was noted.  No chondrocalcinosis was noted.  No patellofemoral narrowing was noted. Impression: Unremarkable x-ray of the knee joint.  Xr Knee 3 View Right  Result Date: 07/02/2017 No joint space narrowing was noted.  No chondrocalcinosis was noted.  No patellofemoral narrowing was noted. Impression: Unremarkable x-ray of the knee joint.   Speciality Comments: No specialty comments  available.    Procedures:  No procedures performed Allergies: Patient has no known allergies.   Assessment / Plan:     Visit Diagnoses: Polyarthralgia-patient complains of pain in multiple joints.  She had no synovitis on examination.  The x-rays were unremarkable today.  The x-ray findings were discussed at length.  She gives history of intermittent swelling in her joints I will obtain some of the autoimmune labs.  I believe most of her discomfort is coming from fibromyalgia.  Joint protection muscle strengthening was discussed.  Need for regular exercise was discussed.  Pain in both hands - Plan: XR Hand 2 View Right, XR Hand 2 View Left, x-ray of bilateral hands were unremarkable.  I will obtain following labs this she gives history of intermittent swelling.  Rheumatoid factor, Cyclic citrul peptide antibody, IgG, Angiotensin converting enzyme, Uric acid, ANA  Pain of both hip joints -she had discomfort range of motion of bilateral hip joints.  Plan: XR HIP UNILAT W OR W/O PELVIS 2-3 VIEWS LEFT, XR HIP UNILAT W OR W/O PELVIS 2-3 VIEWS RIGHT.  The x-ray of the hip joints were unremarkable.  Chronic pain of both knees -she gives history of pain and discomfort in her bilateral knee joints.  Plan: XR KNEE 3 VIEW LEFT, XR KNEE 3 VIEW RIGHT.  The x-rays were unremarkable.  Fibromyalgia-she has long standing history of fibromyalgia.  She has generalized hyperalgesia and positive tender points.  Other medical problems are listed as follows:  Alopecia areata  Vitamin D deficiency  History of hyperlipidemia  History of asthma  History of depression  Hearing loss of right ear, unspecified hearing loss type  Type II diabetes mellitus with manifestations, uncontrolled ([place])    Orders: Orders Placed This Encounter  Procedures  . XR Hand 2 View Right  . XR Hand 2 View Left  . XR KNEE 3 VIEW LEFT  . XR KNEE 3 VIEW RIGHT  . XR HIP UNILAT W OR W/O PELVIS 2-3 VIEWS LEFT  . XR HIP UNILAT W  OR W/O PELVIS 2-3 VIEWS RIGHT  . Rheumatoid factor  . Cyclic citrul peptide antibody, IgG  . Angiotensin converting enzyme  . Uric acid  . ANA   No orders of the defined types were placed in this encounter.   Face-to-face time spent with patient was 50 minutes.  Greater than 50% of time  was spent in counseling and coordination of care.  Follow-Up Instructions: No follow-ups on file.   Bo Merino, MD  Note - This record has been created using Editor, commissioning.  Chart creation errors have been sought, but may not always  have been located. Such creation errors do not reflect on  the standard of medical care.

## 2017-07-02 ENCOUNTER — Ambulatory Visit (INDEPENDENT_AMBULATORY_CARE_PROVIDER_SITE_OTHER): Payer: Self-pay

## 2017-07-02 ENCOUNTER — Encounter: Payer: Self-pay | Admitting: Rheumatology

## 2017-07-02 ENCOUNTER — Ambulatory Visit (INDEPENDENT_AMBULATORY_CARE_PROVIDER_SITE_OTHER): Payer: BLUE CROSS/BLUE SHIELD | Admitting: Rheumatology

## 2017-07-02 VITALS — BP 115/73 | HR 90 | Resp 12 | Ht 65.0 in | Wt 154.0 lb

## 2017-07-02 DIAGNOSIS — L639 Alopecia areata, unspecified: Secondary | ICD-10-CM

## 2017-07-02 DIAGNOSIS — E118 Type 2 diabetes mellitus with unspecified complications: Secondary | ICD-10-CM | POA: Diagnosis not present

## 2017-07-02 DIAGNOSIS — M25551 Pain in right hip: Secondary | ICD-10-CM

## 2017-07-02 DIAGNOSIS — IMO0002 Reserved for concepts with insufficient information to code with codable children: Secondary | ICD-10-CM

## 2017-07-02 DIAGNOSIS — Z8709 Personal history of other diseases of the respiratory system: Secondary | ICD-10-CM

## 2017-07-02 DIAGNOSIS — M25561 Pain in right knee: Secondary | ICD-10-CM

## 2017-07-02 DIAGNOSIS — H9191 Unspecified hearing loss, right ear: Secondary | ICD-10-CM | POA: Diagnosis not present

## 2017-07-02 DIAGNOSIS — E559 Vitamin D deficiency, unspecified: Secondary | ICD-10-CM

## 2017-07-02 DIAGNOSIS — M25552 Pain in left hip: Secondary | ICD-10-CM

## 2017-07-02 DIAGNOSIS — M79642 Pain in left hand: Secondary | ICD-10-CM

## 2017-07-02 DIAGNOSIS — M79641 Pain in right hand: Secondary | ICD-10-CM | POA: Diagnosis not present

## 2017-07-02 DIAGNOSIS — M2550 Pain in unspecified joint: Secondary | ICD-10-CM

## 2017-07-02 DIAGNOSIS — M797 Fibromyalgia: Secondary | ICD-10-CM

## 2017-07-02 DIAGNOSIS — Z8639 Personal history of other endocrine, nutritional and metabolic disease: Secondary | ICD-10-CM

## 2017-07-02 DIAGNOSIS — Z8659 Personal history of other mental and behavioral disorders: Secondary | ICD-10-CM

## 2017-07-02 DIAGNOSIS — M25562 Pain in left knee: Secondary | ICD-10-CM

## 2017-07-02 DIAGNOSIS — G8929 Other chronic pain: Secondary | ICD-10-CM

## 2017-07-02 DIAGNOSIS — E1165 Type 2 diabetes mellitus with hyperglycemia: Secondary | ICD-10-CM

## 2017-07-03 LAB — ANGIOTENSIN CONVERTING ENZYME: ANGIOTENSIN-CONVERTING ENZYME: 68 U/L — AB (ref 9–67)

## 2017-07-03 LAB — RHEUMATOID FACTOR: Rhuematoid fact SerPl-aCnc: 15 IU/mL — ABNORMAL HIGH (ref <14)

## 2017-07-03 LAB — URIC ACID: URIC ACID, SERUM: 4.4 mg/dL (ref 2.5–7.0)

## 2017-07-03 LAB — CYCLIC CITRUL PEPTIDE ANTIBODY, IGG

## 2017-07-03 NOTE — Progress Notes (Signed)
Please schedule N[facility] visit to discuss lab results.

## 2017-07-04 LAB — ANTI-NUCLEAR AB-TITER (ANA TITER)

## 2017-07-04 LAB — ANA: Anti Nuclear Antibody(ANA): POSITIVE — AB

## 2017-07-27 NOTE — Progress Notes (Signed)
Office Visit Note  Patient: Adriana Burke             Date of Birth: [birth year]           MRN: [id number]             PCP: System, Pcp Not In Referring: No ref. provider found Visit Date: 07/31/2017 Occupation: @GUAROCC @    Subjective:  Fatigue.   History of Present Illness: Adriana Burke is a 47 y.o. female with history of polyarthralgia and fibromyalgia.  She states she is continues to have a lot of fatigue.  She states that the left ankle joint where she had surgery by Dr. Beola Cord in the past continues to hurt her.  None of the other joints are swollen.  Activities of Daily Living:  Patient reports morning stiffness for 24 hours.   Patient Reports nocturnal pain.  Difficulty dressing/grooming: Denies Difficulty climbing stairs: Reports Difficulty getting out of chair: Denies Difficulty using hands for taps, buttons, cutlery, and/or writing: Reports   Review of Systems  Constitutional: Positive for activity change and fatigue. Negative for night sweats, weight gain and weight loss.  HENT: Negative for mouth sores, trouble swallowing, trouble swallowing, mouth dryness and nose dryness.   Eyes: Negative for pain, redness, visual disturbance and dryness.  Respiratory: Positive for wheezing. Negative for cough, shortness of breath and difficulty breathing.   Cardiovascular: Negative for chest pain, palpitations, hypertension, irregular heartbeat and swelling in legs/feet.  Gastrointestinal: Negative for blood in stool, constipation and diarrhea.  Endocrine: Negative for excessive thirst and increased urination.  Genitourinary: Negative for difficulty urinating and vaginal dryness.  Musculoskeletal: Positive for arthralgias, gait problem, joint pain, muscle weakness, morning stiffness and muscle tenderness. Negative for joint swelling, myalgias and myalgias.  Skin: Negative for color change, rash, hair loss, skin tightness, ulcers and sensitivity to sunlight.    Allergic/Immunologic: Negative for susceptible to infections.  Neurological: Positive for dizziness, numbness and headaches. Negative for light-headedness, memory loss, night sweats and weakness.  Hematological: Negative for bruising/bleeding tendency and swollen glands.  Psychiatric/Behavioral: Positive for sleep disturbance. Negative for depressed mood. The patient is not nervous/anxious.     PMFS History:  Patient Active Problem List   Diagnosis Date Noted  . Acute cystitis without hematuria 09/16/2014  . Encounter for immunization 02/24/2014  . Fibrocystic changes of right breast 12/03/2013  . Bilateral hip pain 10/14/2013  . Alopecia areata 09/16/2013  . Impaired hearing rt ear 01/02/2013  . Abnormality of gait 06/12/2012  . Osteoarthritis of left hip 05/31/2011  . Chronic generalized pain 04/01/2011  . Diabetic polyneuropathy ([place]) 02/24/2010  . FIBROMYALGIA 10/29/2009  . WRIST PAIN, BILATERAL 11/16/2008  . Vitamin D deficiency 07/24/2008  . RHEUMATOID ARTHRITIS 04/17/2008  . HYPERLIPIDEMIA 04/08/2008  . Type II diabetes mellitus with manifestations, uncontrolled ([place]) 01/08/2008  . KNEE PAIN, BILATERAL 08/23/2006  . DEPRESSION 04/10/2006  . ASTHMA 01/08/2006    Past Medical History:  Diagnosis Date  . Asthma   . Chronic rhinitis   . Depression   . Diabetes mellitus   . Episodic recurrent vertigo   . Fibromyalgia   . History of hip fracture    hx childhood hip fracture  . Hyperlipidemia   . Obesity   . Peripheral neuropathy   . Rheumatoid arthritis(714.0)    seen by Rheumatology in [facility], Rheumatoid Factor pos, Anti CCP pos in 2005 -? early presentation of RF  . Tinnitus    history of, Evaluated by ENT> [place]  .  Vitamin D deficiency    history of    Family History  Problem Relation Age of Onset  . Hypertension Mother   . Diabetes Maternal Grandmother   . Arthritis Maternal Grandmother   . Kidney disease Maternal Grandmother    Past Surgical  History:  Procedure Laterality Date  . CESAREAN SECTION     Social History   Social History Narrative   [phone]     Objective: Vital Signs: BP 106/61 (BP Location: Left Arm, Patient Position: Sitting, Cuff Size: Normal)   Pulse 100   Resp 14   Ht 5\' 5"  (1.651 m)   Wt 150 lb (68 kg)   LMP 07/01/2017   BMI 24.96 kg/m    Physical Exam  Constitutional: She is oriented to person, place, and time. She appears well-developed and well-nourished.  HENT:  Head: Normocephalic and atraumatic.  Eyes: Conjunctivae and EOM are normal.  Neck: Normal range of motion.  Cardiovascular: Normal rate, regular rhythm, normal heart sounds and intact distal pulses.  Pulmonary/Chest: Effort normal and breath sounds normal.  Abdominal: Soft. Bowel sounds are normal.  Lymphadenopathy:    She has no cervical adenopathy.  Neurological: She is alert and oriented to person, place, and time.  Skin: Skin is warm and dry. Capillary refill takes less than 2 seconds.  Psychiatric: She has a normal mood and affect. Her behavior is normal.  Nursing note and vitals reviewed.    Musculoskeletal Exam: C-spine thoracic lumbar spine good range of motion.  Shoulder joints elbow joints wrist joint MCPs PIPs DIPs were in good range of motion with no synovitis.  Hip joints knee joints ankles MTPs PIPs DIPs were in good range of motion with no synovitis.  CDAI Exam: No CDAI exam completed.    Investigation: Findings:  07/02/17: ACE 68, RF 15, CCP >250, ANA 1:40 Nucleolar, uric acid 4.4    Component     Latest Ref Rng & Units 07/02/2017  ANA Pattern 1      NUCLEOLAR (A)  ANA Titer 1     titer 1:40 (H)  RA Latex Turbid.     <14 IU/mL 15 (H)  Cyclic Citrullin Peptide Ab     UNITS >250 (H)  Angiotensin-Converting Enzyme     9 - 67 U/L 68 (H)  Uric Acid, Serum     2.5 - 7.0 mg/dL 4.4  Anit Nuclear Antibody(ANA)     NEGATIVE POSITIVE (A)    CBC Latest Ref Rng & Units 06/17/2016 05/25/2014 09/24/2013  WBC 4.0  - 10.5 K/uL 5.4 5.6 4.1  Hemoglobin 12.0 - 15.0 g/dL 13.1 12.8 12.6  Hematocrit 36.0 - 46.0 % 39.4 38.2 40.1  Platelets 150 - 400 K/uL 222 207 217   CMP Latest Ref Rng & Units 06/17/2016 05/18/2015 05/25/2014  Glucose 65 - 99 mg/dL 419(H) 338(H) 322(H)  BUN 6 - 20 mg/dL 10 9 8   Creatinine 0.44 - 1.00 mg/dL 0.94 0.65 0.81  Sodium 135 - 145 mmol/L 129(L) 132(L) 132(L)  Potassium 3.5 - 5.1 mmol/L 4.1 4.1 3.4(L)  Chloride 101 - 111 mmol/L 93(L) 92(L) 98  CO2 22 - 32 mmol/L 24 20 27   Calcium 8.9 - 10.3 mg/dL 9.6 9.3 9.2  Total Protein 6.0 - 8.3 g/dL - - -  Total Bilirubin 0.2 - 1.2 mg/dL - - -  Alkaline Phos 39 - 117 U/L - - -  AST 0 - 37 U/L - - -  ALT 0 - 35 U/L - - -  Imaging: Xr Hip Unilat W Or W/o Pelvis 2-3 Views Left  Result Date: 07/02/2017 No hip joint narrowing was noted.  No chondrocalcinosis was noted. Impression: Normal x-ray of the hip joint.  Xr Hip Unilat W Or W/o Pelvis 2-3 Views Right  Result Date: 07/02/2017 No hip joint narrowing was noted.  No chondrocalcinosis was noted. Impression: Normal x-ray of the hip joint.  Xr Hand 2 View Left  Result Date: 07/02/2017 No MCP, PIP, DIP, intercarpal joint space narrowing was noted.  No erosive changes were noted. Impression: Normal x-ray of the hand.  Xr Hand 2 View Right  Result Date: 07/02/2017 No MCP, PIP, DIP, intercarpal joint space narrowing was noted.  No erosive changes were noted. Impression: Normal x-ray of the hand.  Xr Knee 3 View Left  Result Date: 07/02/2017 No joint space narrowing was noted.  No chondrocalcinosis was noted.  No patellofemoral narrowing was noted. Impression: Unremarkable x-ray of the knee joint.  Xr Knee 3 View Right  Result Date: 07/02/2017 No joint space narrowing was noted.  No chondrocalcinosis was noted.  No patellofemoral narrowing was noted. Impression: Unremarkable x-ray of the knee joint.   Speciality Comments: No specialty comments available.    Procedures:  No  procedures performed Allergies: Patient has no known allergies.   Assessment / Plan:     Visit Diagnoses: Fibromyalgia - XR of hands, hips, knees unremarkable.  She continues to have generalized pain and discomfort.  Pain in both hands - RF 15 and CCP >250ANA 1:40 NucleolarXR unremarkable, no synovitis on exam.  I will schedule ultrasound of bilateral hands to evaluate further.  Pain in both feet-no synovitis was noted.  I will schedule ultrasound of bilateral feet.  Patient reports severe pain and discomfort in her feet which she describes as burning sensation.  She states she has tried gabapentin in the past without much relief.  She requested prescription for Voltaren gel.  Side effects were reviewed.  Have given her prescription for Voltaren gel.  Alopecia areata  Vitamin D deficiency-he is on vitamin D supplement.  History of hyperlipidemia  History of asthma  History of depression  Hearing loss of right ear, unspecified hearing loss type  Type II diabetes mellitus with manifestations, uncontrolled ([place])   Orders: No orders of the defined types were placed in this encounter.  Meds ordered this encounter  Medications  . diclofenac sodium (VOLTAREN) 1 % GEL    Sig: Apply 3 gm to 3 large joints up to 3 times a day.Dispense 3 tubes with 3 refills.    Dispense:  3 Tube    Refill:  1    Face-to-face time spent with patient was 30 minutes. >50% of time was spent in counseling and coordination of care.  Follow-Up Instructions: Return for FMS, +CCP.   Bo Merino, MD  Note - This record has been created using Editor, commissioning.  Chart creation errors have been sought, but may not always  have been located. Such creation errors do not reflect on  the standard of medical care.

## 2017-07-31 ENCOUNTER — Ambulatory Visit: Payer: BLUE CROSS/BLUE SHIELD | Admitting: Rheumatology

## 2017-07-31 ENCOUNTER — Encounter: Payer: Self-pay | Admitting: Rheumatology

## 2017-07-31 VITALS — BP 106/61 | HR 100 | Resp 14 | Ht 65.0 in | Wt 150.0 lb

## 2017-07-31 DIAGNOSIS — E118 Type 2 diabetes mellitus with unspecified complications: Secondary | ICD-10-CM | POA: Diagnosis not present

## 2017-07-31 DIAGNOSIS — M797 Fibromyalgia: Secondary | ICD-10-CM

## 2017-07-31 DIAGNOSIS — Z8639 Personal history of other endocrine, nutritional and metabolic disease: Secondary | ICD-10-CM | POA: Diagnosis not present

## 2017-07-31 DIAGNOSIS — M79671 Pain in right foot: Secondary | ICD-10-CM | POA: Diagnosis not present

## 2017-07-31 DIAGNOSIS — E559 Vitamin D deficiency, unspecified: Secondary | ICD-10-CM

## 2017-07-31 DIAGNOSIS — E1165 Type 2 diabetes mellitus with hyperglycemia: Secondary | ICD-10-CM | POA: Diagnosis not present

## 2017-07-31 DIAGNOSIS — H9191 Unspecified hearing loss, right ear: Secondary | ICD-10-CM | POA: Diagnosis not present

## 2017-07-31 DIAGNOSIS — M79641 Pain in right hand: Secondary | ICD-10-CM

## 2017-07-31 DIAGNOSIS — Z8709 Personal history of other diseases of the respiratory system: Secondary | ICD-10-CM

## 2017-07-31 DIAGNOSIS — L639 Alopecia areata, unspecified: Secondary | ICD-10-CM

## 2017-07-31 DIAGNOSIS — M79642 Pain in left hand: Secondary | ICD-10-CM | POA: Diagnosis not present

## 2017-07-31 DIAGNOSIS — M79672 Pain in left foot: Secondary | ICD-10-CM

## 2017-07-31 DIAGNOSIS — Z8659 Personal history of other mental and behavioral disorders: Secondary | ICD-10-CM | POA: Diagnosis not present

## 2017-07-31 DIAGNOSIS — IMO0002 Reserved for concepts with insufficient information to code with codable children: Secondary | ICD-10-CM

## 2017-07-31 MED ORDER — DICLOFENAC SODIUM 1 % TD GEL: TRANSDERMAL | 1 refills | Status: DC

## 2017-08-01 ENCOUNTER — Ambulatory Visit: Payer: BLUE CROSS/BLUE SHIELD | Admitting: Rheumatology

## 2017-08-03 NOTE — Progress Notes (Signed)
   Ultrasound examination of bilateral hands was performed per EULAR recommendations. Using 12 MHz transducer, grayscale and power Doppler bilateral first, second, fourth and fifth MTP joints both dorsal and plantar aspects were evaluated to look for synovitis or tenosynovitis. The findings were there was no synovitis or tenosynovitis on ultrasound examination.  Impression: Ultrasound examination did not show any synovitis on examination.  Ultrasound examination of bilateral hands was performed per EULAR recommendations. Using 12 MHz transducer, grayscale and power Doppler bilateral first, second, fourth and fifth MTP joints both dorsal and plantar aspects were evaluated to look for synovitis or tenosynovitis. The findings were there was no synovitis or tenosynovitis on ultrasound examination.  Impression: Ultrasound examination did not show any synovitis on examination.  Bo Merino, MD

## 2017-08-08 ENCOUNTER — Ambulatory Visit (INDEPENDENT_AMBULATORY_CARE_PROVIDER_SITE_OTHER): Payer: BLUE CROSS/BLUE SHIELD | Admitting: Rheumatology

## 2017-08-08 ENCOUNTER — Ambulatory Visit (INDEPENDENT_AMBULATORY_CARE_PROVIDER_SITE_OTHER): Payer: Self-pay

## 2017-08-08 DIAGNOSIS — M79641 Pain in right hand: Secondary | ICD-10-CM | POA: Diagnosis not present

## 2017-08-08 DIAGNOSIS — M79672 Pain in left foot: Secondary | ICD-10-CM

## 2017-08-08 DIAGNOSIS — M79671 Pain in right foot: Secondary | ICD-10-CM

## 2017-08-08 DIAGNOSIS — M79642 Pain in left hand: Secondary | ICD-10-CM

## 2017-08-17 ENCOUNTER — Ambulatory Visit: Payer: BLUE CROSS/BLUE SHIELD

## 2017-08-21 ENCOUNTER — Ambulatory Visit: Payer: BLUE CROSS/BLUE SHIELD

## 2017-08-30 ENCOUNTER — Telehealth: Payer: Self-pay | Admitting: Radiology

## 2017-08-30 NOTE — Telephone Encounter (Signed)
notified pt medication prior auth was approved and to call pharmacy to get filled

## 2017-09-22 ENCOUNTER — Other Ambulatory Visit: Payer: Self-pay | Admitting: Internal Medicine

## 2017-09-24 NOTE — Telephone Encounter (Signed)
Needs appointment

## 2017-09-26 ENCOUNTER — Ambulatory Visit: Payer: BLUE CROSS/BLUE SHIELD | Admitting: Podiatry

## 2017-09-26 ENCOUNTER — Encounter: Payer: Self-pay | Admitting: Podiatry

## 2017-09-26 DIAGNOSIS — E0843 Diabetes mellitus due to underlying condition with diabetic autonomic (poly)neuropathy: Secondary | ICD-10-CM

## 2017-09-26 MED ORDER — PREGABALIN 100 MG PO CAPS: 100.0000 mg | ORAL_CAPSULE | Freq: Three times a day (TID) | ORAL | 2 refills | Status: DC

## 2017-09-29 NOTE — Progress Notes (Signed)
   HPI: 47 year old female with PMHx of DM presenting today as a new patient with a chief complaint of burning pain in the bilateral toes that has been ongoing for several years. She has tried taking gabapentin with no significant relief. She denies any modifying factors. Patient is here for further evaluation and treatment.   Past Medical History:  Diagnosis Date  . Asthma   . Chronic rhinitis   . Depression   . Diabetes mellitus   . Episodic recurrent vertigo   . Fibromyalgia   . History of hip fracture    hx childhood hip fracture  . Hyperlipidemia   . Obesity   . Peripheral neuropathy   . Rheumatoid arthritis(714.0)    seen by Rheumatology in [facility], Rheumatoid Factor pos, Anti CCP pos in 2005 -? early presentation of RF  . Tinnitus    history of, Evaluated by ENT> [place]  . Vitamin D deficiency    history of     Physical Exam: General: The patient is alert and oriented x3 in no acute distress.  Dermatology: Skin is warm, dry and supple bilateral lower extremities. Negative for open lesions or macerations.  Vascular: Palpable pedal pulses bilaterally. No edema or erythema noted. Capillary refill within normal limits.  Neurological: Epicritic and protective threshold diminished bilaterally.   Musculoskeletal Exam: Range of motion within normal limits to all pedal and ankle joints bilateral. Muscle strength 5/5 in all groups bilateral.    Assessment: 1. DM with neuropathy BLE   Plan of Care:  1. Patient evaluated. 2. Prescription for Lyrica 100 mg three times daily provided to patient.  3. Prescription for neuropathy pain cream to be dispensed by [facility].  4. Stressed importance of controlling glucose levels.  5. Recommended good shoe gear.  6. Return to clinic as needed.       Edrick Kins, DPM [facility]  Dr. Edrick Kins, DPM    [address], [place] [address]                  Office [phone]  Fax [phone]

## 2017-10-22 ENCOUNTER — Encounter: Payer: BLUE CROSS/BLUE SHIELD | Admitting: Internal Medicine

## 2017-11-18 ENCOUNTER — Encounter (HOSPITAL_COMMUNITY): Payer: Self-pay | Admitting: Emergency Medicine

## 2017-11-18 ENCOUNTER — Ambulatory Visit (HOSPITAL_COMMUNITY)
Admission: EM | Admit: 2017-11-18 | Discharge: 2017-11-18 | Disposition: A | Discharge status: Home / Self Care | Payer: BLUE CROSS/BLUE SHIELD | Attending: Internal Medicine | Admitting: Internal Medicine

## 2017-11-18 ENCOUNTER — Other Ambulatory Visit: Payer: Self-pay

## 2017-11-18 DIAGNOSIS — R824 Acetonuria: Secondary | ICD-10-CM

## 2017-11-18 DIAGNOSIS — L292 Pruritus vulvae: Secondary | ICD-10-CM | POA: Diagnosis present

## 2017-11-18 DIAGNOSIS — E785 Hyperlipidemia, unspecified: Secondary | ICD-10-CM | POA: Diagnosis not present

## 2017-11-18 DIAGNOSIS — Z79899 Other long term (current) drug therapy: Secondary | ICD-10-CM | POA: Diagnosis not present

## 2017-11-18 DIAGNOSIS — E1140 Type 2 diabetes mellitus with diabetic neuropathy, unspecified: Secondary | ICD-10-CM | POA: Diagnosis not present

## 2017-11-18 DIAGNOSIS — T383X6A Underdosing of insulin and oral hypoglycemic [antidiabetic] drugs, initial encounter: Secondary | ICD-10-CM | POA: Diagnosis not present

## 2017-11-18 DIAGNOSIS — Z794 Long term (current) use of insulin: Secondary | ICD-10-CM | POA: Diagnosis not present

## 2017-11-18 DIAGNOSIS — E669 Obesity, unspecified: Secondary | ICD-10-CM | POA: Diagnosis not present

## 2017-11-18 DIAGNOSIS — Z7951 Long term (current) use of inhaled steroids: Secondary | ICD-10-CM | POA: Insufficient documentation

## 2017-11-18 DIAGNOSIS — J45909 Unspecified asthma, uncomplicated: Secondary | ICD-10-CM | POA: Insufficient documentation

## 2017-11-18 DIAGNOSIS — M797 Fibromyalgia: Secondary | ICD-10-CM | POA: Diagnosis not present

## 2017-11-18 DIAGNOSIS — Z791 Long term (current) use of non-steroidal anti-inflammatories (NSAID): Secondary | ICD-10-CM | POA: Diagnosis not present

## 2017-11-18 DIAGNOSIS — N898 Other specified noninflammatory disorders of vagina: Secondary | ICD-10-CM | POA: Insufficient documentation

## 2017-11-18 DIAGNOSIS — M069 Rheumatoid arthritis, unspecified: Secondary | ICD-10-CM | POA: Diagnosis not present

## 2017-11-18 DIAGNOSIS — E1165 Type 2 diabetes mellitus with hyperglycemia: Secondary | ICD-10-CM | POA: Insufficient documentation

## 2017-11-18 DIAGNOSIS — Z91128 Patient's intentional underdosing of medication regimen for other reason: Secondary | ICD-10-CM | POA: Diagnosis not present

## 2017-11-18 DIAGNOSIS — E559 Vitamin D deficiency, unspecified: Secondary | ICD-10-CM | POA: Diagnosis not present

## 2017-11-18 LAB — GLUCOSE, CAPILLARY: GLUCOSE-CAPILLARY: 507 mg/dL — AB (ref 70–99)

## 2017-11-18 MED ORDER — FLUCONAZOLE 200 MG PO TABS: ORAL_TABLET | ORAL | 0 refills | Status: DC

## 2017-11-18 NOTE — ED Notes (Signed)
Urinalysis testing exceeded limitations of the [employer] capabilities. Urine culture obtained

## 2017-11-18 NOTE — Discharge Instructions (Addendum)
Urine was significant for glucose and ketones. Finger stick was 507.  Recommending further evaluation and management in the ED.  Patient refuses at this time.  States she will go to pharmacy and pick up insulin and take as directed.  We will send urine out to culture.  Urine culture obtained Will treat for potential yeast infection based on symptoms today. Prescribed diflucan 200 mg once daily and then second dose 72 hours later Take medications as prescribed and to completion Vaginal swab obtained.  We will follow up with you regarding the results of your test.   If tests are positive, please abstain from sexual activity for at least 7 days and notify partners Go to ER if symptoms do not improve with insulin. Please follow up with PCP this week for reevaluation and management of chronic uncontrolled diabetes

## 2017-11-18 NOTE — ED Triage Notes (Signed)
Pt reports vaginal itching with no d/c x3 days.  She denies any other urinary symptoms.

## 2017-11-18 NOTE — ED Provider Notes (Signed)
[facility]   [id number] 11/18/17 Arrival Time: 1252   NL:ZJQBHAL DISCHARGE  SUBJECTIVE:  Adriana Burke is a 47 y.o. female who presents with complaints of gradual vaginal discharge, vaginal itching, and vaginal odor that began 3 days ago.  Does admit to change in body wash.  Last unprotected sex a few weeks ago.  Patient is sexually active with 1 female partner.  Describes discharge as thin and "creamy."  She has NOT tried OTC medications.  Denies worsening symptoms.  She reports similar symptoms in the past and was diagnosed with yeast infection, and treated with medications at that time.  She denies fever, chills, nausea, vomiting, abdominal or pelvic pain, urinary symptoms, vaginal bleeding, dyspareunia, vaginal rashes or lesions.   Patient's hx is significant for DM.  Patient mentions she has not had her insulin in three days.  Called PCP and states insulin is at pharmacy at this time, she just has not had a chance to pick it up.  Admits to light-headedness, dizzy, nausea, polydipsia, and polyuria.    Patient's last menstrual period was 11/10/2017 (approximate).  ROS: As per HPI.  Past Medical History:  Diagnosis Date  . Asthma   . Chronic rhinitis   . Depression   . Diabetes mellitus   . Episodic recurrent vertigo   . Fibromyalgia   . History of hip fracture    hx childhood hip fracture  . Hyperlipidemia   . Obesity   . Peripheral neuropathy   . Rheumatoid arthritis(714.0)    seen by Rheumatology in [facility], Rheumatoid Factor pos, Anti CCP pos in 2005 -? early presentation of RF  . Tinnitus    history of, Evaluated by ENT> [place]  . Vitamin D deficiency    history of   Past Surgical History:  Procedure Laterality Date  . CESAREAN SECTION     No Known Allergies No current facility-administered medications on file prior to encounter.    Current Outpatient Medications on File Prior to Encounter  Medication Sig Dispense Refill  . albuterol (PROVENTIL  HFA;VENTOLIN HFA) 108 (90 Base) MCG/ACT inhaler Inhale 1-2 puffs into the lungs every 6 (six) hours as needed for wheezing or shortness of breath. 1 Inhaler 0  . gabapentin (NEURONTIN) 600 MG tablet Take one tablet in morning, one tablet afternoon, and two tablets at night 120 tablet 2  . LANTUS SOLOSTAR 100 UNIT/ML Solostar Pen INJECT 20 UNITS INTO THE SKIN DAILY AT 10 PM. 15 pen 2  . albuterol (PROVENTIL HFA;VENTOLIN HFA) 108 (90 BASE) MCG/ACT inhaler Inhale 2 puffs into the lungs every 6 (six) hours as needed for wheezing or shortness of breath. 1 Inhaler 5  . diclofenac sodium (VOLTAREN) 1 % GEL Apply 3 gm to 3 large joints up to 3 times a day.Dispense 3 tubes with 3 refills. 3 Tube 1  . naproxen sodium (ALEVE) 220 MG tablet Take 440 mg by mouth 2 (two) times daily as needed (pain/headache).    . NON FORMULARY     . NON FORMULARY [facility]  Peripheral Neuropathy Cream- Bupivacaine 1%, Doxepin 3%, Gabapentin 6%, Pentoxifylline 3%, Topiramate 1% Apply 1-2 grams to affected area 3-4 times daily Qty. 120 gm 3 refills    . pregabalin (LYRICA) 100 MG capsule Take 1 capsule (100 mg total) by mouth 3 (three) times daily. 90 capsule 2  . Vitamin D, Cholecalciferol, 1000 units TABS Take 1,000 Units by mouth daily. 60 tablet 1    Social History   Socioeconomic History  .  Marital status: Legally Separated    Spouse name: Not on file  . Number of children: Not on file  . Years of education: 11  . Highest education level: Not on file  Occupational History    Employer: [employer]  . Financial resource strain: Not on file  . Food insecurity:    Worry: Not on file    Inability: Not on file  . Transportation needs:    Medical: Not on file    Non-medical: Not on file  Tobacco Use  . Smoking status: Never Smoker  . Smokeless tobacco: Never Used  Substance and Sexual Activity  . Alcohol use: Yes    Alcohol/week: 0.0 standard drinks    Comment: occasionally  .  Drug use: No  . Sexual activity: Not on file  Lifestyle  . Physical activity:    Days per week: Not on file    Minutes per session: Not on file  . Stress: Not on file  Relationships  . Social connections:    Talks on phone: Not on file    Gets together: Not on file    Attends religious service: Not on file    Active member of club or organization: Not on file    Attends meetings of clubs or organizations: Not on file    Relationship status: Not on file  . Intimate partner violence:    Fear of current or ex partner: Not on file    Emotionally abused: Not on file    Physically abused: Not on file    Forced sexual activity: Not on file  Other Topics Concern  . Not on file  Social History Narrative   [phone]   Family History  Problem Relation Age of Onset  . Hypertension Mother   . Diabetes Maternal Grandmother   . Arthritis Maternal Grandmother   . Kidney disease Maternal Grandmother     OBJECTIVE:  Vitals:   11/18/17 1316 11/18/17 1318  BP: (!) 163/134 117/89  Pulse: 92   Temp: 98.3 F (36.8 C)   TempSrc: Oral   SpO2: 100%      General appearance: alert, cooperative, appears stated age and no distress Throat: lips, mucosa, and tongue normal; teeth and gums normal Lungs: CTA bilaterally without adventitious breath sounds Heart: regular rate and rhythm.  Radial pulses 2+ symmetrical bilaterally Back: no CVA tenderness Abdomen: soft, non-tender; bowel sounds normal; no guarding or rebound tenderness GU: deferred.  Vaginal self-swab obtained Skin: warm and dry Psychological:  Alert and cooperative. Normal mood and affect.  LABS:   Results for orders placed or performed during the hospital encounter of 11/18/17 (from the past 24 hour(s))  Glucose, capillary     Status: Abnormal   Collection Time: 11/18/17  2:16 PM  Result Value Ref Range   Glucose-Capillary 507 (HH) 70 - 99 mg/dL   Comment 1 Document in Chart     MDM:   Discussed potential life-threatening  complications related to uncontrolled DM with elevated glucose in office as well as ketones in urine such as DKA.  Informed patient her current condition could be potentially life-threatening.  Patient still refuses to go to ED at this time.  VS stable at this time.  Patient does not appear toxic.  Exam unremarkable.  Will pick up insulin and take as prescribed.  Instructed her to go to ED if she did not have improvement with medication and to follow up with PCP for further evaluation and management of chronic  uncontrolled DM.    ASSESSMENT & PLAN:  1. Vaginal itching   2. Type 2 diabetes mellitus with hyperglycemia, unspecified whether long term insulin use (HCC)   3. Ketonuria     Meds ordered this encounter  Medications  . fluconazole (DIFLUCAN) 200 MG tablet    Sig: Take one dose by mouth, wait 72 hours, and then take second dose by mouth    Dispense:  2 tablet    Refill:  0    Order Specific Question:   Supervising Provider    Answer:   Wynona Luna [[id number]]    Pending: Labs Reviewed  GLUCOSE, CAPILLARY - Abnormal; Notable for the following components:      Result Value   Glucose-Capillary 507 (*)    All other components within normal limits  URINE CULTURE  CERVICOVAGINAL ANCILLARY ONLY   Urine was significant for glucose and ketones. Finger stick was 507.  Recommending further evaluation and management in the ED.  Patient refuses at this time.  States she will go to pharmacy and pick up insulin and take as directed.  We will send urine out to culture.  Urine culture obtained Will treat for potential yeast infection based on symptoms today. Prescribed diflucan 200 mg once daily and then second dose 72 hours later Take medications as prescribed and to completion Vaginal swab obtained.  We will follow up with you regarding the results of your test.   If tests are positive, please abstain from sexual activity for at least 7 days and notify partners Go to ER if symptoms do  not improve with insulin. Please follow up with PCP this week for reevaluation and management of chronic uncontrolled diabetes  Reviewed expectations re: course of current medical issues. Questions answered. Outlined signs and symptoms indicating need for more acute intervention. Patient verbalized understanding. After Visit Summary given.       Lestine Box, PA-C 11/18/17 1616

## 2017-11-19 LAB — CERVICOVAGINAL ANCILLARY ONLY
BACTERIAL VAGINITIS: NEGATIVE
Candida vaginitis: NEGATIVE
Chlamydia: NEGATIVE
Neisseria Gonorrhea: NEGATIVE
TRICH (WINDOWPATH): POSITIVE — AB

## 2017-11-20 ENCOUNTER — Telehealth (HOSPITAL_COMMUNITY): Payer: Self-pay

## 2017-11-20 LAB — URINE CULTURE

## 2017-11-20 MED ORDER — METRONIDAZOLE 500 MG PO TABS: 2000.0000 mg | ORAL_TABLET | Freq: Once | ORAL | 0 refills | Status: AC

## 2017-11-20 NOTE — Telephone Encounter (Signed)
Urine culture is positive for group B Strep germ, which is part of the normal urogenital germ flora and does not represent a UTI.   Trichomonas is positive. Rx metronidazole 2 grams was sent to the pharmacy of record.  Need to educate patient to refrain from sexual intercourse for 7 days to give the medicine time to work. Sexual partners need to be notified and tested/treated. Condoms may reduce risk of reinfection. Attempted to reach patient. No answer at this time.

## 2017-11-20 NOTE — Telephone Encounter (Signed)
Pt aware of results and new rx.

## 2017-12-09 ENCOUNTER — Encounter (HOSPITAL_COMMUNITY): Payer: Self-pay | Admitting: Emergency Medicine

## 2017-12-09 ENCOUNTER — Emergency Department (HOSPITAL_COMMUNITY)
Admission: EM | Admit: 2017-12-09 | Discharge: 2017-12-09 | Disposition: A | Discharge status: Home / Self Care | Payer: BLUE CROSS/BLUE SHIELD | Attending: Emergency Medicine | Admitting: Emergency Medicine

## 2017-12-09 ENCOUNTER — Other Ambulatory Visit: Payer: Self-pay

## 2017-12-09 DIAGNOSIS — J45909 Unspecified asthma, uncomplicated: Secondary | ICD-10-CM | POA: Insufficient documentation

## 2017-12-09 DIAGNOSIS — Z794 Long term (current) use of insulin: Secondary | ICD-10-CM | POA: Insufficient documentation

## 2017-12-09 DIAGNOSIS — E1140 Type 2 diabetes mellitus with diabetic neuropathy, unspecified: Secondary | ICD-10-CM | POA: Diagnosis present

## 2017-12-09 DIAGNOSIS — Z79899 Other long term (current) drug therapy: Secondary | ICD-10-CM | POA: Diagnosis not present

## 2017-12-09 DIAGNOSIS — E1142 Type 2 diabetes mellitus with diabetic polyneuropathy: Secondary | ICD-10-CM

## 2017-12-09 LAB — BASIC METABOLIC PANEL
Anion gap: 6 (ref 5–15)
BUN: 7 mg/dL (ref 6–20)
CHLORIDE: 103 mmol/L (ref 98–111)
CO2: 25 mmol/L (ref 22–32)
Calcium: 8.9 mg/dL (ref 8.9–10.3)
Creatinine, Ser: 0.65 mg/dL (ref 0.44–1.00)
GFR calc Af Amer: >60 mL/min (ref >60)
GFR calc non Af Amer: >60 mL/min (ref >60)
GLUCOSE: 253 mg/dL — AB (ref 70–99)
POTASSIUM: 3.7 mmol/L (ref 3.5–5.1)
Sodium: 134 mmol/L — ABNORMAL LOW (ref 135–145)

## 2017-12-09 LAB — CBG MONITORING, ED: Glucose-Capillary: 252 mg/dL — ABNORMAL HIGH (ref 70–99)

## 2017-12-09 LAB — VITAMIN B12: Vitamin B-12: 306 pg/mL (ref 180–914)

## 2017-12-09 MED ORDER — LIDOCAINE 5 % EX PTCH: 1.0000 | MEDICATED_PATCH | CUTANEOUS | 0 refills | Status: DC

## 2017-12-09 MED ORDER — ACETAMINOPHEN 500 MG PO TABS: 1000.0000 mg | ORAL_TABLET | Freq: Once | ORAL | Status: DC

## 2017-12-09 NOTE — ED Provider Notes (Signed)
[facility] EMERGENCY DEPARTMENT Provider Note   CSN: [id number] Arrival date & time: 12/09/17  1230     History   Chief Complaint Chief Complaint  Patient presents with  . Peripheral Neuropathy    HPI Adriana Burke is a 47 y.o. female with a history of diabetes mellitus type 2, fibromyalgia, asthma, and HLD who presents to the emergency department with a chief complaint of bilateral foot pain.  The patient endorses bilateral foot pain, characterized as burning, for months.  She states that she feels like she is walking on hot coals.  She states that the pain is also sharp and spreads across her feet to the lower part of her legs.  She is also starting to have similar symptoms in her bilateral hands.  She denies weakness, back pain, ankle, knee, or hip pain, fever, chills, chest pain, dyspnea, abdominal pain, or rash.    She reports that she was previously diagnosed with diabetic neuropathy and did take 600 mg of gabapentin 3 times daily.  She states that this medication is not helping her pain.  She reports that she previously tried capsaicin once, but did not like the way that made her feet feel because of the burning sensation.  She reports that she often forgets to take her home insulin.  She states that she did not take a dose this morning.  She reports that she has missed many doses over the last few weeks because she forgets to take it.  She reports that she has previously been seen by podiatry and orthopedics for the same complaints without improvement.  The history is provided by the patient. No language interpreter was used.    Past Medical History:  Diagnosis Date  . Asthma   . Chronic rhinitis   . Depression   . Diabetes mellitus   . Episodic recurrent vertigo   . Fibromyalgia   . History of hip fracture    hx childhood hip fracture  . Hyperlipidemia   . Obesity   . Peripheral neuropathy   . Rheumatoid arthritis(714.0)    seen by Rheumatology in  [facility], Rheumatoid Factor pos, Anti CCP pos in 2005 -? early presentation of RF  . Tinnitus    history of, Evaluated by ENT> [place]  . Vitamin D deficiency    history of    Patient Active Problem List   Diagnosis Date Noted  . Acute cystitis without hematuria 09/16/2014  . Encounter for immunization 02/24/2014  . Fibrocystic changes of right breast 12/03/2013  . Bilateral hip pain 10/14/2013  . Alopecia areata 09/16/2013  . Impaired hearing rt ear 01/02/2013  . Abnormality of gait 06/12/2012  . Osteoarthritis of left hip 05/31/2011  . Chronic generalized pain 04/01/2011  . Diabetic polyneuropathy ([place]) 02/24/2010  . FIBROMYALGIA 10/29/2009  . WRIST PAIN, BILATERAL 11/16/2008  . Vitamin D deficiency 07/24/2008  . RHEUMATOID ARTHRITIS 04/17/2008  . HYPERLIPIDEMIA 04/08/2008  . Type II diabetes mellitus with manifestations, uncontrolled ([place]) 01/08/2008  . KNEE PAIN, BILATERAL 08/23/2006  . DEPRESSION 04/10/2006  . ASTHMA 01/08/2006    Past Surgical History:  Procedure Laterality Date  . CESAREAN SECTION       OB History    Gravida  6   Para  6   Term  6   Preterm      AB      Living  6     SAB      TAB      Ectopic  Multiple  1   Live Births               Home Medications    Prior to Admission medications   Medication Sig Start Date End Date Taking? Authorizing Provider  albuterol (PROVENTIL HFA;VENTOLIN HFA) 108 (90 BASE) MCG/ACT inhaler Inhale 2 puffs into the lungs every 6 (six) hours as needed for wheezing or shortness of breath. 10/14/13   Corky Sox, MD  albuterol (PROVENTIL HFA;VENTOLIN HFA) 108 (90 Base) MCG/ACT inhaler Inhale 1-2 puffs into the lungs every 6 (six) hours as needed for wheezing or shortness of breath. 11/06/16   Lysbeth Penner, FNP  diclofenac sodium (VOLTAREN) 1 % GEL Apply 3 gm to 3 large joints up to 3 times a day.Dispense 3 tubes with 3 refills. 07/31/17   Bo Merino, MD  fluconazole (DIFLUCAN) 200 MG  tablet Take one dose by mouth, wait 72 hours, and then take second dose by mouth 11/18/17   Wurst, [place], PA-C  gabapentin (NEURONTIN) 600 MG tablet Take one tablet in morning, one tablet afternoon, and two tablets at night 12/11/16 12/11/17  Chundi, Verne Spurr, MD  LANTUS SOLOSTAR 100 UNIT/ML Solostar Pen INJECT 20 UNITS INTO THE SKIN DAILY AT 10 PM. 09/24/17   Sid Falcon, MD  lidocaine (LIDODERM) 5 % Place 1 patch onto the skin daily. Remove & Discard patch within 12 hours or as directed by MD 12/09/17   Anihya Tuma A, PA-C  naproxen sodium (ALEVE) 220 MG tablet Take 440 mg by mouth 2 (two) times daily as needed (pain/headache).    [provider]  NON FORMULARY     [provider]  NON FORMULARY [facility]  Peripheral Neuropathy Cream- Bupivacaine 1%, Doxepin 3%, Gabapentin 6%, Pentoxifylline 3%, Topiramate 1% Apply 1-2 grams to affected area 3-4 times daily Qty. 120 gm 3 refills    [provider]  pregabalin (LYRICA) 100 MG capsule Take 1 capsule (100 mg total) by mouth 3 (three) times daily. 09/26/17   Edrick Kins, DPM  Vitamin D, Cholecalciferol, 1000 units TABS Take 1,000 Units by mouth daily. 03/28/17   Valinda Party, DO    Family History Family History  Problem Relation Age of Onset  . Hypertension Mother   . Diabetes Maternal Grandmother   . Arthritis Maternal Grandmother   . Kidney disease Maternal Grandmother     Social History Social History   Tobacco Use  . Smoking status: Never Smoker  . Smokeless tobacco: Never Used  Substance Use Topics  . Alcohol use: Yes    Alcohol/week: 0.0 standard drinks    Comment: occasionally  . Drug use: No     Allergies   Patient has no known allergies.   Review of Systems Review of Systems  Constitutional: Negative for activity change, chills and fever.  Respiratory: Negative for shortness of breath.   Cardiovascular: Negative for chest pain.  Gastrointestinal: Negative for  abdominal pain, constipation, diarrhea and vomiting.  Genitourinary: Negative for dysuria.  Musculoskeletal: Positive for arthralgias and myalgias. Negative for back pain and joint swelling.  Skin: Negative for rash.  Allergic/Immunologic: Negative for immunocompromised state.  Neurological: Negative for weakness, numbness and headaches.       Paresthesias to the bilateral lower extremities  Psychiatric/Behavioral: Negative for confusion.     Physical Exam Updated Vital Signs BP 118/89   Pulse 89   Temp 98.3 F (36.8 C) (Oral)   Resp 14   Ht 5\' 5"  (1.651 m)  Wt 65.8 kg   LMP 11/10/2017 (Approximate)   SpO2 100%   BMI 24.13 kg/m   Physical Exam  Constitutional: No distress.  HENT:  Head: Normocephalic.  Eyes: Conjunctivae are normal.  Neck: Neck supple.  Cardiovascular: Normal rate, regular rhythm, normal heart sounds and intact distal pulses. Exam reveals no gallop and no friction rub.  No murmur heard. Pulmonary/Chest: Effort normal. No stridor. No respiratory distress. She has no wheezes. She has no rales. She exhibits no tenderness.  Abdominal: Soft. She exhibits no distension and no mass. There is no tenderness. There is no rebound and no guarding. No hernia.  Abdomen is soft, nontender, nondistended.  No overlying areas of erythema, edema, or warmth.  Musculoskeletal: She exhibits no tenderness.  5 out of 5 strength against resistance with dorsiflexion plantarflexion.  DP pulses are 2+ and symmetric.  No focal tenderness to palpation to the bilateral feet, ankles, or knees.  No tenderness to the bilateral calves.  No wounds or puncture marks to the bilateral feet.  No overlying erythema, edema, or warmth.  Sensation is intact throughout the bilateral lower extremities.  No tenderness to the cervical, thoracic, or lumbar spinous processes.  Able to bear weight on the bilateral lower extremities.  Antalgic gait.  Neurological: She is alert.  Skin: Skin is warm. No rash  noted.  Psychiatric: Her behavior is normal.  Nursing note and vitals reviewed.    ED Treatments / Results  Labs (all labs ordered are listed, but only abnormal results are displayed) Labs Reviewed  BASIC METABOLIC PANEL - Abnormal; Notable for the following components:      Result Value   Sodium 134 (*)    Glucose, Bld 253 (*)    All other components within normal limits  CBG MONITORING, ED - Abnormal; Notable for the following components:   Glucose-Capillary 252 (*)    All other components within normal limits  VITAMIN B12    EKG None  Radiology No results found.  Procedures Procedures (including critical care time)  Medications Ordered in ED Medications - No data to display   Initial Impression / Assessment and Plan / ED Course  I have reviewed the triage vital signs and the nursing notes.  Pertinent labs & imaging results that were available during my care of the patient were reviewed by me and considered in my medical decision making (see chart for details).     47 year old female with history of diabetes mellitus type 2, asthma, and fibromyalgia with a known history of diabetic neuropathy presenting with worsening of burning pain to the bilateral lower extremities.  On exam, no concerns for diabetic ulcer, cellulitis, DVT, or volume overload.  After reviewing her medical record, she has been evaluated for similar complaints multiple times, including her internist, podiatry, and orthopedics.  She has been advised that controlling her glucose will help with her symptoms.  To me, she endorses poor compliance with her home insulin.  She mostly states that this is because that she will have abdominal pain from giving herself injections.  On her abdominal exam today, abdomen is unremarkable.  She states that there is no tenderness but palpation at this time.  She states that she has not given herself insulin at home since yesterday.  She was given a dose of Tylenol in the  emergency department.  I recommended that she try capsaicin cream again since she only tried it once before discontinuing it.  I have also recommended that she follow-up with her  internist and may need to see an endocrinologist for good glycemic control.  She seems to have poor insight into the long-term ramifications of poorly controlled diabetes.  We will also prescribe lidocaine patches to see if she can get intermittent improvement in her pain.  Strict return precautions given.  She is hemodynamically stable and in no acute distress.  She is safe for discharge home with outpatient follow-up at this time.  Final Clinical Impressions(s) / ED Diagnoses   Final diagnoses:  Diabetic polyneuropathy associated with type 2 diabetes mellitus [facility])    ED Discharge Orders         Ordered    lidocaine (LIDODERM) 5 %  Every 24 hours     12/09/17 1613           Joline Maxcy A, PA-C 12/09/17 2246    Valarie Merino, MD 12/12/17 [phone]

## 2017-12-09 NOTE — ED Notes (Signed)
Patient verbalizes understanding of discharge instructions. Opportunity for questioning and answers were provided. Armband removed by staff, pt discharged from ED.  

## 2017-12-09 NOTE — ED Triage Notes (Addendum)
Pt reports hx of neuropathy from her diabetes, states that she takes 600mg  gabpentin for nerve pain in her feet but burning and sharp pains are progressing up her legs. States blood sugars have not been controlled but she is working to get them under control

## 2017-12-09 NOTE — Discharge Instructions (Addendum)
Thank you for allowing me to care for you today in the Emergency Department.   Good control of your blood sugar is essential for controlling the pain in your feet in your hands.  Sometimes if your primary care provider is unable to get the pain under control, you may need referred to an endocrinologist.  The most important thing is making sure that you do not miss doses of your insulin at home.  Sometimes setting a daily alarm so that you do not miss doses can help to keep your blood sugar under control.  Since you tried the capsaicin cream once previously, you can try this again to see if that will improve your pain.  You can use this cream on your feet and lower legs up to 4 times daily.  If you cannot tolerate the side effects of the cream, you can try applying a lidocaine patch onto each foot once daily.   I would recommend calling to schedule follow-up appointment with your primary care provider for a recheck this week.  The other option would be that you may need your Lyrica dosing adjusted or there are some other treatments that primary care can initiate if they feel appropriate.  Return to the emergency department if you develop new or worsening symptoms including if your feet get red, swollen, and hot to the touch, if you develop fever chills, if you develop persistent vomiting with a significantly high blood sugar, or other new, concerning symptoms.

## 2017-12-21 ENCOUNTER — Ambulatory Visit: Payer: BLUE CROSS/BLUE SHIELD

## 2018-05-22 ENCOUNTER — Encounter (HOSPITAL_COMMUNITY): Payer: Self-pay | Admitting: Emergency Medicine

## 2018-05-22 ENCOUNTER — Emergency Department (HOSPITAL_COMMUNITY)
Admission: EM | Admit: 2018-05-22 | Discharge: 2018-05-23 | Disposition: A | Discharge status: Home / Self Care | Payer: BLUE CROSS/BLUE SHIELD | Attending: Emergency Medicine | Admitting: Emergency Medicine

## 2018-05-22 ENCOUNTER — Other Ambulatory Visit: Payer: Self-pay

## 2018-05-22 DIAGNOSIS — Z794 Long term (current) use of insulin: Secondary | ICD-10-CM | POA: Insufficient documentation

## 2018-05-22 DIAGNOSIS — E1165 Type 2 diabetes mellitus with hyperglycemia: Secondary | ICD-10-CM | POA: Insufficient documentation

## 2018-05-22 DIAGNOSIS — E1142 Type 2 diabetes mellitus with diabetic polyneuropathy: Secondary | ICD-10-CM

## 2018-05-22 DIAGNOSIS — Z79899 Other long term (current) drug therapy: Secondary | ICD-10-CM | POA: Insufficient documentation

## 2018-05-22 DIAGNOSIS — R739 Hyperglycemia, unspecified: Secondary | ICD-10-CM

## 2018-05-22 DIAGNOSIS — J45909 Unspecified asthma, uncomplicated: Secondary | ICD-10-CM | POA: Insufficient documentation

## 2018-05-22 LAB — BASIC METABOLIC PANEL
Anion gap: 10 (ref 5–15)
BUN: 11 mg/dL (ref 6–20)
CALCIUM: 9 mg/dL (ref 8.9–10.3)
CHLORIDE: 95 mmol/L — AB (ref 98–111)
CO2: 26 mmol/L (ref 22–32)
CREATININE: 1.06 mg/dL — AB (ref 0.44–1.00)
GFR calc Af Amer: >60 mL/min (ref >60)
GFR calc non Af Amer: >60 mL/min (ref >60)
Glucose, Bld: 512 mg/dL (ref 70–99)
Potassium: 4.1 mmol/L (ref 3.5–5.1)
Sodium: 131 mmol/L — ABNORMAL LOW (ref 135–145)

## 2018-05-22 LAB — CBC WITH DIFFERENTIAL/PLATELET
Abs Immature Granulocytes: 0.01 10*3/uL (ref 0.00–0.07)
BASOS ABS: 0 10*3/uL (ref 0.0–0.1)
BASOS PCT: 1 %
EOS ABS: 0.2 10*3/uL (ref 0.0–0.5)
EOS PCT: 3 %
HCT: 40 % (ref 36.0–46.0)
Hemoglobin: 12.5 g/dL (ref 12.0–15.0)
Immature Granulocytes: 0 %
LYMPHS PCT: 26 %
Lymphs Abs: 1.4 10*3/uL (ref 0.7–4.0)
MCH: 23 pg — ABNORMAL LOW (ref 26.0–34.0)
MCHC: 31.3 g/dL (ref 30.0–36.0)
MCV: 73.5 fL — ABNORMAL LOW (ref 80.0–100.0)
Monocytes Absolute: 0.4 10*3/uL (ref 0.1–1.0)
Monocytes Relative: 8 %
NRBC: 0 % (ref 0.0–0.2)
Neutro Abs: 3.2 10*3/uL (ref 1.7–7.7)
Neutrophils Relative %: 62 %
Platelets: 189 10*3/uL (ref 150–400)
RBC: 5.44 MIL/uL — AB (ref 3.87–5.11)
RDW: 15.2 % (ref 11.5–15.5)
WBC: 5.2 10*3/uL (ref 4.0–10.5)

## 2018-05-22 LAB — CBG MONITORING, ED: Glucose-Capillary: 293 mg/dL — ABNORMAL HIGH (ref 70–99)

## 2018-05-22 MED ORDER — TRAMADOL HCL 50 MG PO TABS: 50.0000 mg | ORAL_TABLET | Freq: Four times a day (QID) | ORAL | 0 refills | Status: DC | PRN

## 2018-05-22 MED ORDER — SODIUM CHLORIDE 0.9 % IV BOLUS
2000.0000 mL | Freq: Once | INTRAVENOUS | Status: AC
  Administered 2018-05-22: 2000 mL via INTRAVENOUS

## 2018-05-22 MED ORDER — INSULIN ASPART 100 UNIT/ML ~~LOC~~ SOLN
10.0000 [IU] | Freq: Once | SUBCUTANEOUS | Status: AC
  Administered 2018-05-22: 10 [IU] via SUBCUTANEOUS
  Filled 2018-05-22: qty 1

## 2018-05-22 MED ORDER — ACETAMINOPHEN 325 MG PO TABS
650.0000 mg | ORAL_TABLET | Freq: Once | ORAL | Status: AC
  Administered 2018-05-22: 650 mg via ORAL
  Filled 2018-05-22: qty 2

## 2018-05-22 NOTE — ED Provider Notes (Addendum)
[facility] DEPT Provider Note   CSN: [id number] Arrival date & time: 05/22/18  1906    History   Chief Complaint Chief Complaint  Patient presents with  . Foot Pain    HPI Adriana Burke is a 48 y.o. female with history of asthma, diabetes presenting to emergency department today with chief complaint of bilateral foot pain.  Patient states his pain has been present for months, estimating at least 3.  She describes the pain as burning.  Is located in her toes and radiates throughout foot.  Patient states she has known diabetic neuropathy and takes gabapentin for it.  She has been taking gabapentin 3 times a day without relief.  Is able to walk but states that the burning pain is very aggravating. She rates the pain 6 out of 10 in severity. Patient states her blood sugar can be very high.  She says sometimes it is in the 500s and sometimes it is in the 300s.  She has not checked it within the last week.  Patient takes insulin.  She reports that she ran out of insulin today. Denies any fever,  weakness, numbness, tingling.       Past Medical History:  Diagnosis Date  . Asthma   . Chronic rhinitis   . Depression   . Diabetes mellitus   . Episodic recurrent vertigo   . Fibromyalgia   . History of hip fracture    hx childhood hip fracture  . Hyperlipidemia   . Obesity   . Peripheral neuropathy   . Rheumatoid arthritis(714.0)    seen by Rheumatology in [facility], Rheumatoid Factor pos, Anti CCP pos in 2005 -? early presentation of RF  . Tinnitus    history of, Evaluated by ENT> [place]  . Vitamin D deficiency    history of    Patient Active Problem List   Diagnosis Date Noted  . Acute cystitis without hematuria 09/16/2014  . Encounter for immunization 02/24/2014  . Fibrocystic changes of right breast 12/03/2013  . Bilateral hip pain 10/14/2013  . Alopecia areata 09/16/2013  . Impaired hearing rt ear 01/02/2013  . Abnormality of gait  06/12/2012  . Osteoarthritis of left hip 05/31/2011  . Chronic generalized pain 04/01/2011  . Diabetic polyneuropathy ([place]) 02/24/2010  . FIBROMYALGIA 10/29/2009  . WRIST PAIN, BILATERAL 11/16/2008  . Vitamin D deficiency 07/24/2008  . RHEUMATOID ARTHRITIS 04/17/2008  . HYPERLIPIDEMIA 04/08/2008  . Type II diabetes mellitus with manifestations, uncontrolled ([place]) 01/08/2008  . KNEE PAIN, BILATERAL 08/23/2006  . DEPRESSION 04/10/2006  . ASTHMA 01/08/2006    Past Surgical History:  Procedure Laterality Date  . CESAREAN SECTION       OB History    Gravida  6   Para  6   Term  6   Preterm      AB      Living  6     SAB      TAB      Ectopic      Multiple  1   Live Births               Home Medications    Prior to Admission medications   Medication Sig Start Date End Date Taking? Authorizing Provider  albuterol (PROVENTIL HFA;VENTOLIN HFA) 108 (90 Base) MCG/ACT inhaler Inhale 1-2 puffs into the lungs every 6 (six) hours as needed for wheezing or shortness of breath. 11/06/16  Yes Lysbeth Penner, FNP  gabapentin (NEURONTIN) 600 MG  tablet Take 600 mg by mouth 3 (three) times daily.   Yes [provider]  LANTUS SOLOSTAR 100 UNIT/ML Solostar Pen INJECT 20 UNITS INTO THE SKIN DAILY AT 10 PM. Patient taking differently: Inject 45 Units into the skin daily.  09/24/17  Yes Sid Falcon, MD  albuterol (PROVENTIL HFA;VENTOLIN HFA) 108 (90 BASE) MCG/ACT inhaler Inhale 2 puffs into the lungs every 6 (six) hours as needed for wheezing or shortness of breath. Patient not taking: Reported on 05/22/2018 10/14/13   Corky Sox, MD  diclofenac sodium (VOLTAREN) 1 % GEL Apply 3 gm to 3 large joints up to 3 times a day.Dispense 3 tubes with 3 refills. Patient not taking: Reported on 05/22/2018 07/31/17   Bo Merino, MD  fluconazole (DIFLUCAN) 200 MG tablet Take one dose by mouth, wait 72 hours, and then take second dose by mouth Patient not taking: Reported on  05/22/2018 11/18/17   Wurst, [place], PA-C  gabapentin (NEURONTIN) 600 MG tablet Take one tablet in morning, one tablet afternoon, and two tablets at night 12/11/16 12/11/17  Chundi, Verne Spurr, MD  lidocaine (LIDODERM) 5 % Place 1 patch onto the skin daily. Remove & Discard patch within 12 hours or as directed by MD Patient not taking: Reported on 05/22/2018 12/09/17   Joanne Gavel, PA-C  NON FORMULARY [facility]  Peripheral Neuropathy Cream- Bupivacaine 1%, Doxepin 3%, Gabapentin 6%, Pentoxifylline 3%, Topiramate 1% Apply 1-2 grams to affected area 3-4 times daily Qty. 120 gm 3 refills    [provider]  pregabalin (LYRICA) 100 MG capsule Take 1 capsule (100 mg total) by mouth 3 (three) times daily. Patient not taking: Reported on 05/22/2018 09/26/17   Edrick Kins, DPM  traMADol (ULTRAM) 50 MG tablet Take 1 tablet (50 mg total) by mouth every 6 (six) hours as needed for severe pain. 05/22/18   Albrizze, Kaitlyn E, PA-C  Vitamin D, Cholecalciferol, 1000 units TABS Take 1,000 Units by mouth daily. Patient not taking: Reported on 05/22/2018 03/28/17   Valinda Party, DO    Family History Family History  Problem Relation Age of Onset  . Hypertension Mother   . Diabetes Maternal Grandmother   . Arthritis Maternal Grandmother   . Kidney disease Maternal Grandmother     Social History Social History   Tobacco Use  . Smoking status: Never Smoker  . Smokeless tobacco: Never Used  Substance Use Topics  . Alcohol use: Yes    Alcohol/week: 0.0 standard drinks    Comment: occasionally  . Drug use: No     Allergies   Patient has no known allergies.   Review of Systems Review of Systems  Constitutional: Negative for activity change, chills and fever.  HENT: Negative for congestion, ear discharge, ear pain, sinus pressure, sinus pain and sore throat.   Eyes: Negative for pain, redness and visual disturbance.  Respiratory: Negative for cough and shortness of  breath.   Cardiovascular: Negative for chest pain and leg swelling.  Gastrointestinal: Negative for abdominal pain, constipation, diarrhea, nausea and vomiting.  Endocrine: Negative for polydipsia, polyphagia and polyuria.  Genitourinary: Negative for dysuria and hematuria.  Musculoskeletal: Positive for arthralgias and myalgias. Negative for back pain and neck pain.  Skin: Negative for wound.  Allergic/Immunologic: Positive for immunocompromised state (diabetic).  Neurological: Negative for dizziness, weakness, numbness and headaches.     Physical Exam Updated Vital Signs BP 140/83   Pulse (!) 107   Temp 97.9 F (36.6 C) (Oral)   Resp  20   SpO2 97%   Physical Exam Vitals signs and nursing note reviewed.  Constitutional:      Appearance: She is well-developed. She is not ill-appearing or toxic-appearing.  HENT:     Head: Normocephalic and atraumatic.     Nose: Nose normal.     Mouth/Throat:     Mouth: Mucous membranes are moist.     Pharynx: Oropharynx is clear.  Eyes:     General: No scleral icterus.       Right eye: No discharge.        Left eye: No discharge.     Extraocular Movements: Extraocular movements intact.     Conjunctiva/sclera: Conjunctivae normal.     Pupils: Pupils are equal, round, and reactive to light.  Neck:     Musculoskeletal: Normal range of motion.  Cardiovascular:     Rate and Rhythm: Regular rhythm. Tachycardia present.     Pulses: Normal pulses.     Heart sounds: Normal heart sounds.  Pulmonary:     Effort: Pulmonary effort is normal.     Breath sounds: Normal breath sounds.  Abdominal:     General: There is no distension.     Palpations: Abdomen is soft.     Tenderness: There is no abdominal tenderness.  Musculoskeletal: Normal range of motion.     Comments: Pt has no focal tenderness to palpation of bilateral lower extremities including feet, ankle, knee. Non tender to palpation of bilateral calves. Sensation intact throughout bilateral  lower extremities. 5/5 strength against resistance with plantarflexion and dorsiflexion. DP pulses 2+ bilaterally. Patellar reflex 2+ bilaterally Non tender to palpation of C, T, or L spine. Mildly antalgic gait noted.  Skin:    General: Skin is warm and dry.     Findings: No lesion or rash.     Comments: No puncture marks or wounds to bilateral feet. No erythema, edema, or warmth  Neurological:     Mental Status: She is alert and oriented to person, place, and time.     Comments: Fluent speech, no facial droop.  Psychiatric:        Behavior: Behavior normal.      ED Treatments / Results  Labs (all labs ordered are listed, but only abnormal results are displayed) Labs Reviewed  BASIC METABOLIC PANEL - Abnormal; Notable for the following components:      Result Value   Sodium 131 (*)    Chloride 95 (*)    Glucose, Bld 512 (*)    Creatinine, Ser 1.06 (*)    All other components within normal limits  CBC WITH DIFFERENTIAL/PLATELET - Abnormal; Notable for the following components:   RBC 5.44 (*)    MCV 73.5 (*)    MCH 23.0 (*)    All other components within normal limits  CBG MONITORING, ED - Abnormal; Notable for the following components:   Glucose-Capillary 293 (*)    All other components within normal limits  CBG MONITORING, ED    EKG None  Radiology No results found.  Procedures Procedures (including critical care time)  Medications Ordered in ED Medications  acetaminophen (TYLENOL) tablet 650 mg (650 mg Oral Given 05/22/18 2001)  insulin aspart (novoLOG) injection 10 Units (10 Units Subcutaneous Given 05/22/18 2150)  sodium chloride 0.9 % bolus 2,000 mL (0 mLs Intravenous Stopped 05/23/18 0003)     Initial Impression / Assessment and Plan / ED Course  I have reviewed the triage vital signs and the nursing notes.  Pertinent labs &  imaging results that were available during my care of the patient were reviewed by me and considered in my medical decision making  (see chart for details).   Pt has known diagnosis of diabetic neuropathy. She states her bilateral foot pain today is similar to previous ED visit. Given pt's poor medication compliance will initiate work up with CBC and BMP. DDX includes hyperglycemia, DKA, less likely gout, DVT, cellulitis, diabetic ulcer.   Pt's labs are significant for hyperglycemia of 512. She does not have an elevated anion gap, DKA less likely. She does not have leukocytosis making infectious process less likely. Her physical exam is suggestive of diabetic neuropathy, she has no wounds to her feet and no erythema or edema. Pt given 1L of NS and 10 units regular subcutaneous insulin. When rechecked her glucose improved to 293. I planned to give pt 2L IVF however she is requesting to leave after the first. Given her overall improvement, pt discharged home. Prescription sent to her pharmacy for Tramadol for pain. Discussed use of narcotics for severe pain only and that pt should not take the narcotic if doing any activity that may cause injury including driving.  Pt declines refill of her insulin at discharge. She is planing to see pcp in the morning and will discuss options for possible change in insulin.  Pt initially tachycardic in triage. It improved after fluids.  Patient is hemodynamically stable, in NAD, and able to ambulate in the ED. Evaluation does not show pathology that would require ongoing emergent intervention or inpatient treatment. Patient is comfortable with above plan and is stable for discharge at this time. All questions were answered prior to disposition. Strict return precautions for returning to the ED were discussed.    Pt case discussed with Dr. Darl Householder who agrees with my plan. This note was prepared with assistance of Systems analyst. Occasional wrong-word or sound-a-like substitutions may have occurred due to the inherent limitations of voice recognition software.    Final Clinical  Impressions(s) / ED Diagnoses   Final diagnoses:  Hyperglycemia  Diabetic polyneuropathy associated with type 2 diabetes mellitus [facility])    ED Discharge Orders         Ordered    traMADol (ULTRAM) 50 MG tablet  Every 6 hours PRN     05/22/18 2328           Cherre Robins, PA-C 05/23/18 1246    Albrizze, [place], PA-C 05/23/18 1247    Drenda Freeze, MD 05/24/18 [phone]

## 2018-05-22 NOTE — ED Triage Notes (Addendum)
Patient c/o bilateral foot burning for months. Hx neuropathy. Ambulatory. Reports taking gabapentin without relief.

## 2018-05-22 NOTE — Discharge Instructions (Signed)
As we discussed, though your blood sugar is running high, it is not dangerous at this time.  Making adjustments in the Emergency Department (ED) and possibly causing your glucose level to drop too low is more dangerous than continuing your current medications at this time until you can follow up with your clinic doctor.  It is important to follow-up with your primary care doctor to get refills on your insulin and talk about better sugar control.   I sent a prescription to your pharmacy for tramadol.  This is used for pain.  Please take as prescribed.  Is important that you do not drive while taking this medication. do not take the narcotic if doing any activity that may cause injury as it will make you sleepy.    Please continue your medications and follow up with your regular doctor as recommended in these documents.  If you develop new or worsening symptoms that concern you, please return to the Emergency Department.

## 2018-05-22 NOTE — ED Notes (Signed)
Date and time results received: 05/22/18 2055 (use smartphrase ".now" to insert current time)  Test: glucose  Critical Value: 512  Name of Provider Notified: Warden Fillers  Orders Received? Or Actions Taken?: notified Katilyn glucose 512.

## 2018-05-28 ENCOUNTER — Telehealth: Payer: Self-pay

## 2018-05-28 NOTE — Telephone Encounter (Signed)
Pt calls and states she is not covered by insurance at this time and is out of insulin, dr Maudie Mercury could you call her and see if there is help? Also pt says her feet hurt and she is having a heavy discharge again, dr Shan Levans, what do you suggest about these problems?

## 2018-05-28 NOTE — Telephone Encounter (Signed)
After reviewing this patient she appears to have a lot going on and is high risk given her very high uncontrolled blood sugars.  I would actually favor her coming in and getting some insulin samples and maybe we can get delivery set up through [facility] or some other program. I'm not sure they would fill her through the $4 IM program if she came in person but I am not sure.  She will also need a wet prep/urine otherwise we will be guessing what we will be treating.  Her neuropathy is worsening because her sugars are poorly controlled.  I am open to any other ideas.

## 2018-05-28 NOTE — Telephone Encounter (Signed)
Requesting to speak with a nurse about insulin. Please call pt back.  

## 2018-05-30 NOTE — Telephone Encounter (Signed)
Thank you, Dr. Shan Levans, I agree patient has dangerously high sugars and would best be seen in clinic when we can give her samples. I tried calling patient x 3 but could not reach her (no VM available).

## 2018-10-17 ENCOUNTER — Ambulatory Visit: Payer: Self-pay | Admitting: Internal Medicine

## 2018-10-17 ENCOUNTER — Other Ambulatory Visit: Payer: Self-pay

## 2018-10-17 ENCOUNTER — Encounter: Payer: Self-pay | Admitting: Internal Medicine

## 2018-10-17 VITALS — BP 119/87 | HR 92 | Temp 98.1°F | Ht 64.0 in | Wt 151.0 lb

## 2018-10-17 DIAGNOSIS — Z91120 Patient's intentional underdosing of medication regimen due to financial hardship: Secondary | ICD-10-CM

## 2018-10-17 DIAGNOSIS — E1142 Type 2 diabetes mellitus with diabetic polyneuropathy: Secondary | ICD-10-CM

## 2018-10-17 DIAGNOSIS — Z79899 Other long term (current) drug therapy: Secondary | ICD-10-CM

## 2018-10-17 DIAGNOSIS — N920 Excessive and frequent menstruation with regular cycle: Secondary | ICD-10-CM

## 2018-10-17 DIAGNOSIS — Z794 Long term (current) use of insulin: Secondary | ICD-10-CM

## 2018-10-17 LAB — POCT GLYCOSYLATED HEMOGLOBIN (HGB A1C): HbA1c POC (<> result, manual entry): >14 % — AB (ref 4.0–5.6)

## 2018-10-17 LAB — GLUCOSE, CAPILLARY: Glucose-Capillary: 318 mg/dL — ABNORMAL HIGH (ref 70–99)

## 2018-10-17 NOTE — Patient Instructions (Signed)
Adriana Burke,   Is been a pleasure taking care of you here at the clinic.  For your nerve pain, I would encourage for you to increase your nighttime dose of gabapentin to 900 mg at night and take 600 mg in the morning.  I wanted to check some blood work on you to see how your diabetes is doing.  I also want to check your vitamin B12, thyroid level and iron panel however you can come back to have this done once your insurance kicks in.  Take care! Dr. Eileen Stanford  Please call the internal medicine center clinic if you have any questions or concerns, we may be able to help and keep you from a long and expensive emergency room wait. Our clinic and after hours phone number is [phone], the best time to call is Monday through Friday 9 am to 4 pm but there is always someone available 24/7 if you have an emergency. If you need medication refills please notify your pharmacy one week in advance and they will send [name] a request.

## 2018-10-17 NOTE — Progress Notes (Signed)
   CC: Numbness and tingling  HPI:  Adriana Burke is a 48 y.o. with history of uncontrolled insulin-dependent diabetes mellitus and menorrhagia presenting for evaluation of numbness and tingling of both feet.  Please see problem based charting for further details.  Past Medical History:  Diagnosis Date  . Asthma   . Chronic rhinitis   . Depression   . Diabetes mellitus   . Episodic recurrent vertigo   . Fibromyalgia   . History of hip fracture    hx childhood hip fracture  . Hyperlipidemia   . Obesity   . Peripheral neuropathy   . Rheumatoid arthritis(714.0)    seen by Rheumatology in [facility], Rheumatoid Factor pos, Anti CCP pos in 2005 -? early presentation of RF  . Tinnitus    history of, Evaluated by ENT> [place]  . Vitamin D deficiency    history of   Review of Systems: As per HPI  Physical Exam:  Vitals:   10/17/18 1454  BP: 119/87  Pulse: 92  Temp: 98.1 F (36.7 C)  TempSrc: Oral  SpO2: 100%  Weight: 151 lb (68.5 kg)  Height: 5\' 4"  (1.626 m)   Physical Exam  Constitutional: She is well-developed, well-nourished, and in no distress. No distress.  Cardiovascular: Normal rate, regular rhythm and normal heart sounds.  No murmur heard. Pulmonary/Chest: Effort normal and breath sounds normal. She has no wheezes.  Musculoskeletal: Normal range of motion.        General: No deformity or edema.  Neurological: She displays no weakness. No sensory deficit. Gait normal.  Sensation intact to light touch bilaterally at the lower extremities.  Skin: She is not diaphoretic.    Assessment & Plan:   See Encounters Tab for problem based charting.  Patient discussed with Dr. Rebeca Alert

## 2018-10-17 NOTE — Assessment & Plan Note (Addendum)
Polyneuropathy: Her diabetes has been uncontrolled for a while.  In 2018 her A1c was 13.9%.  She states that due to financial constraints, she is unable to afford and take her diabetes medications on a consistent basis.  She is currently self-pay and usually have to pay close to $400 for her Lantus.  She has tried gabapentin for her neuropathy but has not had much relief and also does not take it on a consistent basis due to side effects.    On physical exams, she does have sensation bilaterally at the feet however very hyper sensitive.  Otherwise physical exams are unremarkable.  A1C at clinic today >14.   Plan: -We had an extensive discussion regarding the importance of getting her diabetes under control -Advised to increase nighttime gabapentin to 900 mg and take 600 mg in the morning. - Follow-up vitamin B12, methylmalonic acid, TSH and iron panel - See clinic financial counselor for assistance

## 2018-10-18 ENCOUNTER — Telehealth: Payer: Self-pay | Admitting: Internal Medicine

## 2018-10-18 NOTE — Telephone Encounter (Signed)
I was able to reach Adriana Burke to discuss her lab results.  The most alarming of her labs was her hemoglobin A1c which was greater than 14.  We did have an in-depth discussion regarding medication compliance and she expressed understanding.

## 2018-10-20 LAB — IRON AND TIBC
Iron Saturation: 24 % (ref 15–55)
Iron: 81 ug/dL (ref 27–159)
Total Iron Binding Capacity: 339 ug/dL (ref 250–450)
UIBC: 258 ug/dL (ref 131–425)

## 2018-10-20 LAB — TSH: TSH: 1.94 u[IU]/mL (ref 0.450–4.500)

## 2018-10-20 LAB — FERRITIN: Ferritin: 39 ng/mL (ref 15–150)

## 2018-10-20 LAB — METHYLMALONIC ACID, SERUM: Methylmalonic Acid: 118 nmol/L (ref 0–378)

## 2018-10-21 NOTE — Progress Notes (Signed)
Internal Medicine Clinic Attending  Case discussed with Dr. Agyei at the time of the visit.  We reviewed the resident's history and exam and pertinent patient test results.  I agree with the assessment, diagnosis, and plan of care documented in the resident's note.  Alexander Raines, M.D., Ph.D.  

## 2019-06-27 ENCOUNTER — Encounter: Payer: Self-pay | Admitting: *Deleted

## 2020-04-06 ENCOUNTER — Ambulatory Visit (HOSPITAL_COMMUNITY)
Admission: EM | Admit: 2020-04-06 | Discharge: 2020-04-06 | Disposition: A | Discharge status: Home / Self Care | Payer: 59 | Attending: Student | Admitting: Student

## 2020-04-06 ENCOUNTER — Other Ambulatory Visit: Payer: Self-pay

## 2020-04-06 ENCOUNTER — Encounter (HOSPITAL_COMMUNITY): Payer: Self-pay | Admitting: *Deleted

## 2020-04-06 ENCOUNTER — Ambulatory Visit (INDEPENDENT_AMBULATORY_CARE_PROVIDER_SITE_OTHER): Payer: 59

## 2020-04-06 DIAGNOSIS — R079 Chest pain, unspecified: Secondary | ICD-10-CM | POA: Diagnosis not present

## 2020-04-06 DIAGNOSIS — R0789 Other chest pain: Secondary | ICD-10-CM

## 2020-04-06 LAB — CBG MONITORING, ED: Glucose-Capillary: 97 mg/dL (ref 70–99)

## 2020-04-06 MED ORDER — CYCLOBENZAPRINE HCL 5 MG PO TABS: 5.0000 mg | ORAL_TABLET | Freq: Three times a day (TID) | ORAL | 0 refills | Status: DC | PRN

## 2020-04-06 NOTE — ED Triage Notes (Signed)
PT reports the rt side hurts since she had a asthma attack . Pt reports skin is sore to touch on rt side of chest. Pt denies any N/V/D.

## 2020-04-06 NOTE — Discharge Instructions (Signed)
Your x ray was normal This is most likely musculoskeletal. We will try Flexeril as needed for muscle relaxation.  He can take Tylenol or ibuprofen for pain as needed Rest, alternate heat and ice.  Gentle stretching to the area.

## 2020-04-07 NOTE — ED Provider Notes (Signed)
Roderic Palau    CSN: [id number] Arrival date & time: 04/06/20  Adriana Burke      History   Chief Complaint Chief Complaint  Patient presents with  . Muscle Pain    Rt side    HPI Adriana Burke is a 50 y.o. female.   Pt is a 50 year old female that presents with right sided chest wall pain. This has been for the past few days. Pain is reproducible. Pain worse with inspiration.  Describes as a stinging feeling. Also has hx of diabetes and neuropathy. No heavy lifting or injuries to the chest. Hx of asthma. No SOB, nausea or diaphoresis. No cough, fever, recent illness.       Past Medical History:  Diagnosis Date  . Asthma   . Chronic rhinitis   . Depression   . Diabetes mellitus   . Episodic recurrent vertigo   . Fibromyalgia   . History of hip fracture    hx childhood hip fracture  . Hyperlipidemia   . Obesity   . Peripheral neuropathy   . Rheumatoid arthritis(714.0)    seen by Rheumatology in [facility], Rheumatoid Factor pos, Anti CCP pos in 2005 -? early presentation of RF  . Tinnitus    history of, Evaluated by ENT> [place]  . Vitamin D deficiency    history of    Patient Active Problem List   Diagnosis Date Noted  . Acute cystitis without hematuria 09/16/2014  . Encounter for immunization 02/24/2014  . Fibrocystic changes of right breast 12/03/2013  . Bilateral hip pain 10/14/2013  . Alopecia areata 09/16/2013  . Impaired hearing rt ear 01/02/2013  . Abnormality of gait 06/12/2012  . Osteoarthritis of left hip 05/31/2011  . Chronic generalized pain 04/01/2011  . Diabetic polyneuropathy ([place]) 02/24/2010  . FIBROMYALGIA 10/29/2009  . WRIST PAIN, BILATERAL 11/16/2008  . Vitamin D deficiency 07/24/2008  . RHEUMATOID ARTHRITIS 04/17/2008  . HYPERLIPIDEMIA 04/08/2008  . Type II diabetes mellitus with manifestations, uncontrolled ([place]) 01/08/2008  . KNEE PAIN, BILATERAL 08/23/2006  . DEPRESSION 04/10/2006  . ASTHMA 01/08/2006    Past Surgical  History:  Procedure Laterality Date  . CESAREAN SECTION      OB History    Gravida  6   Para  6   Term  6   Preterm      AB      Living  6     SAB      IAB      Ectopic      Multiple  1   Live Births               Home Medications    Prior to Admission medications   Medication Sig Start Date End Date Taking? Authorizing Provider  albuterol (PROVENTIL HFA;VENTOLIN HFA) 108 (90 BASE) MCG/ACT inhaler Inhale 2 puffs into the lungs every 6 (six) hours as needed for wheezing or shortness of breath. 10/14/13  Yes Corky Sox, MD  albuterol (PROVENTIL HFA;VENTOLIN HFA) 108 (90 Base) MCG/ACT inhaler Inhale 1-2 puffs into the lungs every 6 (six) hours as needed for wheezing or shortness of breath. 11/06/16  Yes Lysbeth Penner, FNP  cyclobenzaprine (FLEXERIL) 5 MG tablet Take 1 tablet (5 mg total) by mouth 3 (three) times daily as needed for muscle spasms. 04/06/20  Yes Markas Aldredge A, NP  gabapentin (NEURONTIN) 600 MG tablet Take 600 mg by mouth 3 (three) times daily.   Yes [provider]  Jerral Ralph  100 UNIT/ML Solostar Pen INJECT 20 UNITS INTO THE SKIN DAILY AT 10 PM. Patient taking differently: Inject 45 Units into the skin daily. 09/24/17  Yes Sid Falcon, MD  pregabalin (LYRICA) 100 MG capsule Take 1 capsule (100 mg total) by mouth 3 (three) times daily. 09/26/17 04/06/20 Yes Edrick Kins, DPM  gabapentin (NEURONTIN) 600 MG tablet Take one tablet in morning, one tablet afternoon, and two tablets at night 12/11/16 12/11/17  Lars Mage, MD  NON [facility]  Peripheral Neuropathy Cream- Bupivacaine 1%, Doxepin 3%, Gabapentin 6%, Pentoxifylline 3%, Topiramate 1% Apply 1-2 grams to affected area 3-4 times daily Qty. 120 gm 3 refills    [provider]    Family History Family History  Problem Relation Age of Onset  . Hypertension Mother   . Diabetes Maternal Grandmother   . Arthritis Maternal Grandmother   . Kidney disease  Maternal Grandmother     Social History Social History   Tobacco Use  . Smoking status: Never Smoker  . Smokeless tobacco: Never Used  Vaping Use  . Vaping Use: Never used  Substance Use Topics  . Alcohol use: Yes    Alcohol/week: 0.0 standard drinks    Comment: occasionally  . Drug use: No     Allergies   Patient has no known allergies.   Review of Systems Review of Systems   Physical Exam Triage Vital Signs ED Triage Vitals  Enc Vitals Group     BP 04/06/20 1914 (!) 156/110     Pulse Rate 04/06/20 1914 98     Resp 04/06/20 1914 18     Temp 04/06/20 1914 98.1 F (36.7 C)     Temp Source 04/06/20 1914 Oral     SpO2 04/06/20 1914 100 %     Weight --      Height --      Head Circumference --      Peak Flow --      Pain Score 04/06/20 1916 10     Pain Loc --      Pain Edu? --      Excl. in [place]? --    No data found.  Updated Vital Signs BP (!) 156/110 (BP Location: Right Arm)   Pulse 98   Temp 98.1 F (36.7 C) (Oral)   Resp 18   LMP 04/06/2020   SpO2 100%   Visual Acuity Right Eye Distance:   Left Eye Distance:   Bilateral Distance:    Right Eye Near:   Left Eye Near:    Bilateral Near:     Physical Exam Vitals and nursing note reviewed.  Constitutional:      General: She is not in acute distress.    Appearance: Normal appearance. She is not ill-appearing, toxic-appearing or diaphoretic.  HENT:     Head: Normocephalic.     Nose: Nose normal.     Mouth/Throat:     Pharynx: Oropharynx is clear.  Eyes:     Conjunctiva/sclera: Conjunctivae normal.  Cardiovascular:     Rate and Rhythm: Normal rate and regular rhythm.  Pulmonary:     Effort: Pulmonary effort is normal. No respiratory distress.     Breath sounds: Normal breath sounds. No stridor. No wheezing, rhonchi or rales.  Chest:     Chest wall: Tenderness present.       Comments: reproducible chest wall pain.  Musculoskeletal:        General: Normal range of motion.     Cervical  back: Normal range of motion.  Skin:    General: Skin is warm and dry.     Findings: No rash.  Neurological:     Mental Status: She is alert.  Psychiatric:        Mood and Affect: Mood normal.      UC Treatments / Results  Labs (all labs ordered are listed, but only abnormal results are displayed) Labs Reviewed  CBG MONITORING, ED    EKG   Radiology DG Chest 2 View  Result Date: 04/06/2020 CLINICAL DATA:  Right-sided chest pain EXAM: CHEST - 2 VIEW COMPARISON:  06/17/2016 FINDINGS: The heart size and mediastinal contours are within normal limits. Both lungs are clear. The visualized skeletal structures are unremarkable. IMPRESSION: No active cardiopulmonary disease. Electronically Signed   By: Inez Catalina M.D.   On: 04/06/2020 19:35    Procedures Procedures (including critical care time)  Medications Ordered in UC Medications - No data to display  Initial Impression / Assessment and Plan / UC Course  I have reviewed the triage vital signs and the nursing notes.  Pertinent labs & imaging results that were available during my care of the patient were reviewed by me and considered in my medical decision making (see chart for details).     Chest wall pain Reproducible pain on exam Chest x ray normal.  No other concerning signs or symptoms.  No concerns on exam.  Believe to be muscular verus neuropathy.  Treating with flexeril and tylenol or ibuprofen as needed.  Rest, alternate heat/ice.  Follow up as needed for continued or worsening symptoms  Final Clinical Impressions(s) / UC Diagnoses   Final diagnoses:  Chest wall pain     Discharge Instructions     Your x ray was normal This is most likely musculoskeletal. We will try Flexeril as needed for muscle relaxation.  He can take Tylenol or ibuprofen for pain as needed Rest, alternate heat and ice.  Gentle stretching to the area.    ED Prescriptions    Medication Sig Dispense Auth. Provider    cyclobenzaprine (FLEXERIL) 5 MG tablet Take 1 tablet (5 mg total) by mouth 3 (three) times daily as needed for muscle spasms. 30 tablet Loura Halt A, NP     PDMP not reviewed this encounter.   Orvan July, NP 04/07/20 [phone]

## 2020-07-03 ENCOUNTER — Ambulatory Visit (HOSPITAL_COMMUNITY)
Admission: EM | Admit: 2020-07-03 | Discharge: 2020-07-03 | Disposition: A | Discharge status: Home / Self Care | Payer: 59 | Attending: Urgent Care | Admitting: Urgent Care

## 2020-07-03 ENCOUNTER — Other Ambulatory Visit: Payer: Self-pay

## 2020-07-03 ENCOUNTER — Ambulatory Visit (INDEPENDENT_AMBULATORY_CARE_PROVIDER_SITE_OTHER): Payer: 59

## 2020-07-03 DIAGNOSIS — S6992XA Unspecified injury of left wrist, hand and finger(s), initial encounter: Secondary | ICD-10-CM

## 2020-07-03 DIAGNOSIS — M25532 Pain in left wrist: Secondary | ICD-10-CM

## 2020-07-03 DIAGNOSIS — S60212A Contusion of left wrist, initial encounter: Secondary | ICD-10-CM | POA: Diagnosis not present

## 2020-07-03 DIAGNOSIS — S60222A Contusion of left hand, initial encounter: Secondary | ICD-10-CM | POA: Diagnosis not present

## 2020-07-03 DIAGNOSIS — M79642 Pain in left hand: Secondary | ICD-10-CM

## 2020-07-03 MED ORDER — NAPROXEN 500 MG PO TABS: 500.0000 mg | ORAL_TABLET | Freq: Two times a day (BID) | ORAL | 0 refills | Status: DC

## 2020-07-03 NOTE — ED Triage Notes (Signed)
Pt present a fall two days ago and possible injured her left hand. Pt limit to Range of motion.  Pt states the pain started at the back of hand and moves toward arm

## 2020-07-03 NOTE — ED Provider Notes (Signed)
[place]   MRN: [id number] DOB: [birth year]  Subjective:   Adriana Burke is a 50 y.o. female presenting for suffering a fall 2 days ago.  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  She observed most the impact on an outstretched hand.  Has had significant pain of the hand and wrist.  Can move the fingers just fine.  Has not used medication for pain relief.  Has a history of fibromyalgia, rheumatoid arthritis.  No history of chronic kidney disease.  No current facility-administered medications for this encounter.  Current Outpatient Medications:  .  albuterol (PROVENTIL HFA;VENTOLIN HFA) 108 (90 BASE) MCG/ACT inhaler, Inhale 2 puffs into the lungs every 6 (six) hours as needed for wheezing or shortness of breath., Disp: 1 Inhaler, Rfl: 5 .  albuterol (PROVENTIL HFA;VENTOLIN HFA) 108 (90 Base) MCG/ACT inhaler, Inhale 1-2 puffs into the lungs every 6 (six) hours as needed for wheezing or shortness of breath., Disp: 1 Inhaler, Rfl: 0 .  cyclobenzaprine (FLEXERIL) 5 MG tablet, Take 1 tablet (5 mg total) by mouth 3 (three) times daily as needed for muscle spasms., Disp: 30 tablet, Rfl: 0 .  gabapentin (NEURONTIN) 600 MG tablet, Take one tablet in morning, one tablet afternoon, and two tablets at night, Disp: 120 tablet, Rfl: 2 .  gabapentin (NEURONTIN) 600 MG tablet, Take 600 mg by mouth 3 (three) times daily., Disp: , Rfl:  .  LANTUS SOLOSTAR 100 UNIT/ML Solostar Pen, INJECT 20 UNITS INTO THE SKIN DAILY AT 10 PM. (Patient taking differently: Inject 45 Units into the skin daily.), Disp: 15 pen, Rfl: 2 .  NON FORMULARY, [facility]  Peripheral Neuropathy Cream- Bupivacaine 1%, Doxepin 3%, Gabapentin 6%, Pentoxifylline 3%, Topiramate 1% Apply 1-2 grams to affected area 3-4 times daily Qty. 120 gm 3 refills, Disp: , Rfl:    No Known Allergies  Past Medical History:  Diagnosis Date  . Asthma    . Chronic rhinitis   . Depression   . Diabetes mellitus   . Episodic recurrent vertigo   . Fibromyalgia   . History of hip fracture    hx childhood hip fracture  . Hyperlipidemia   . Obesity   . Peripheral neuropathy   . Rheumatoid arthritis(714.0)    seen by Rheumatology in [facility], Rheumatoid Factor pos, Anti CCP pos in 2005 -? early presentation of RF  . Tinnitus    history of, Evaluated by ENT> [place]  . Vitamin D deficiency    history of     Past Surgical History:  Procedure Laterality Date  . CESAREAN SECTION      Family History  Problem Relation Age of Onset  . Hypertension Mother   . Diabetes Maternal Grandmother   . Arthritis Maternal Grandmother   . Kidney disease Maternal Grandmother     Social History   Tobacco Use  . Smoking status: Never Smoker  . Smokeless tobacco: Never Used  Vaping Use  . Vaping Use: Never used  Substance Use Topics  . Alcohol use: Yes    Alcohol/week: 0.0 standard drinks    Comment: occasionally  . Drug use: No    ROS   Objective:   Vitals: BP (!) 125/112 (BP Location: Right Arm)   Pulse (!) 104   Temp 97.9 F (36.6 C) (Oral)   Resp 18   SpO2 (!) 18%   100% pulse  oximetry and 88bpm pulse on recheck by PA Aubrey Blackard.   Physical Exam Constitutional:      General: She is not in acute distress.    Appearance: Normal appearance. She is well-developed. She is not ill-appearing.  HENT:     Head: Normocephalic and atraumatic.     Nose: Nose normal.     Mouth/Throat:     Mouth: Mucous membranes are moist.     Pharynx: Oropharynx is clear.  Eyes:     General: No scleral icterus.    Extraocular Movements: Extraocular movements intact.     Pupils: Pupils are equal, round, and reactive to light.  Cardiovascular:     Rate and Rhythm: Normal rate.  Pulmonary:     Effort: Pulmonary effort is normal.  Musculoskeletal:     Comments: Tenderness along the ulnar aspect of her left hand and wrist.  Near full range of motion  for the hand and wrist.  She definitely has movement pain.  Full range of motion for the fingers.  No ecchymosis, warmth, erythema, bony deformity.  No swelling.  Skin:    General: Skin is warm and dry.  Neurological:     General: No focal deficit present.     Mental Status: She is alert and oriented to person, place, and time.  Psychiatric:        Mood and Affect: Mood normal.        Behavior: Behavior normal.     DG Wrist Complete Left  Result Date: 07/03/2020 CLINICAL DATA:  LEFT wrist pain, injury. EXAM: LEFT WRIST - COMPLETE 3+ VIEW COMPARISON:  None. FINDINGS: Osseous alignment is normal. Bone mineralization is normal. No fracture line or displaced fracture fragment is seen. No degenerative change. Soft tissues about the LEFT wrist are unremarkable. IMPRESSION: Negative. Electronically Signed   By: Franki Cabot M.D.   On: 07/03/2020 17:08   Assessment and Plan :   I have reviewed the PDMP during this encounter.  1. Contusion of left hand, initial encounter   2. Left hand pain   3. Left wrist pain   4. Contusion of left wrist, initial encounter     Applied a 2 inch Ace wrap to the wrist extending upwards toward the hand for compression relief.  Recommended rice method.  Naproxen for pain and inflammation. Counseled patient on potential for adverse effects with medications prescribed/recommended today, ER and return-to-clinic precautions discussed, patient verbalized understanding.    Jaynee Eagles, [place] 07/03/20 1733

## 2020-10-15 ENCOUNTER — Other Ambulatory Visit: Payer: Self-pay

## 2020-10-15 ENCOUNTER — Other Ambulatory Visit: Payer: Self-pay | Admitting: Family Medicine

## 2020-10-15 ENCOUNTER — Encounter (HOSPITAL_COMMUNITY): Payer: Self-pay

## 2020-10-15 ENCOUNTER — Ambulatory Visit (HOSPITAL_COMMUNITY)
Admission: EM | Admit: 2020-10-15 | Discharge: 2020-10-15 | Disposition: A | Discharge status: Home / Self Care | Payer: 59 | Attending: Family Medicine | Admitting: Family Medicine

## 2020-10-15 DIAGNOSIS — R109 Unspecified abdominal pain: Secondary | ICD-10-CM | POA: Insufficient documentation

## 2020-10-15 DIAGNOSIS — E1165 Type 2 diabetes mellitus with hyperglycemia: Secondary | ICD-10-CM | POA: Diagnosis not present

## 2020-10-15 DIAGNOSIS — K5900 Constipation, unspecified: Secondary | ICD-10-CM | POA: Insufficient documentation

## 2020-10-15 DIAGNOSIS — R21 Rash and other nonspecific skin eruption: Secondary | ICD-10-CM | POA: Insufficient documentation

## 2020-10-15 LAB — COMPREHENSIVE METABOLIC PANEL
ALT: 21 U/L (ref 0–44)
AST: 16 U/L (ref 15–41)
Albumin: 3 g/dL — ABNORMAL LOW (ref 3.5–5.0)
Alkaline Phosphatase: 114 U/L (ref 38–126)
Anion gap: 7 (ref 5–15)
BUN: 13 mg/dL (ref 6–20)
CO2: 25 mmol/L (ref 22–32)
Calcium: 8.8 mg/dL — ABNORMAL LOW (ref 8.9–10.3)
Chloride: 95 mmol/L — ABNORMAL LOW (ref 98–111)
Creatinine, Ser: 0.99 mg/dL (ref 0.44–1.00)
GFR, Estimated: >60 mL/min (ref >60)
Glucose, Bld: 362 mg/dL — ABNORMAL HIGH (ref 70–99)
Potassium: 3.7 mmol/L (ref 3.5–5.1)
Sodium: 127 mmol/L — ABNORMAL LOW (ref 135–145)
Total Bilirubin: 0.5 mg/dL (ref 0.3–1.2)
Total Protein: 6.6 g/dL (ref 6.5–8.1)

## 2020-10-15 LAB — CBC WITH DIFFERENTIAL/PLATELET
Abs Immature Granulocytes: 0.01 10*3/uL (ref 0.00–0.07)
Basophils Absolute: 0 10*3/uL (ref 0.0–0.1)
Basophils Relative: 1 %
Eosinophils Absolute: 0.3 10*3/uL (ref 0.0–0.5)
Eosinophils Relative: 6 %
HCT: 39.5 % (ref 36.0–46.0)
Hemoglobin: 12.7 g/dL (ref 12.0–15.0)
Immature Granulocytes: 0 %
Lymphocytes Relative: 27 %
Lymphs Abs: 1.6 10*3/uL (ref 0.7–4.0)
MCH: 22.8 pg — ABNORMAL LOW (ref 26.0–34.0)
MCHC: 32.2 g/dL (ref 30.0–36.0)
MCV: 70.8 fL — ABNORMAL LOW (ref 80.0–100.0)
Monocytes Absolute: 0.5 10*3/uL (ref 0.1–1.0)
Monocytes Relative: 8 %
Neutro Abs: 3.4 10*3/uL (ref 1.7–7.7)
Neutrophils Relative %: 58 %
Platelets: 196 10*3/uL (ref 150–400)
RBC: 5.58 MIL/uL — ABNORMAL HIGH (ref 3.87–5.11)
RDW: 15.3 % (ref 11.5–15.5)
WBC: 5.9 10*3/uL (ref 4.0–10.5)
nRBC: 0 % (ref 0.0–0.2)

## 2020-10-15 LAB — POC URINE PREG, ED: Preg Test, Ur: NEGATIVE

## 2020-10-15 LAB — POCT URINALYSIS DIPSTICK, ED / UC
Bilirubin Urine: NEGATIVE
Glucose, UA: >=1000 mg/dL — AB
Ketones, ur: NEGATIVE mg/dL
Leukocytes,Ua: NEGATIVE
Nitrite: NEGATIVE
Protein, ur: >=300 mg/dL — AB
Specific Gravity, Urine: 1.01 (ref 1.005–1.030)
Urobilinogen, UA: 0.2 mg/dL (ref 0.0–1.0)
pH: 5.5 (ref 5.0–8.0)

## 2020-10-15 LAB — CBG MONITORING, ED: Glucose-Capillary: 340 mg/dL — ABNORMAL HIGH (ref 70–99)

## 2020-10-15 MED ORDER — GABAPENTIN 300 MG PO CAPS: 300.0000 mg | ORAL_CAPSULE | Freq: Three times a day (TID) | ORAL | 0 refills | Status: DC

## 2020-10-15 MED ORDER — LANTUS SOLOSTAR 100 UNIT/ML ~~LOC~~ SOPN: 45.0000 [IU] | PEN_INJECTOR | Freq: Every day | SUBCUTANEOUS | 1 refills | Status: DC

## 2020-10-15 MED ORDER — VALACYCLOVIR HCL 1 G PO TABS: 1000.0000 mg | ORAL_TABLET | Freq: Three times a day (TID) | ORAL | 0 refills | Status: AC

## 2020-10-15 MED ORDER — POLYETHYLENE GLYCOL 3350 17 G PO PACK: 17.0000 g | PACK | Freq: Every day | ORAL | 2 refills | Status: DC

## 2020-10-15 MED ORDER — TRIAMCINOLONE ACETONIDE 0.1 % EX CREA: 1.0000 "application " | TOPICAL_CREAM | Freq: Two times a day (BID) | CUTANEOUS | 0 refills | Status: AC

## 2020-10-15 NOTE — ED Triage Notes (Signed)
Pt c/o bilateral side pain that radiates up back.  Started: about 3 months ago per patient

## 2020-10-16 NOTE — Telephone Encounter (Signed)
   Notes to clinic Not a pt in this practice

## 2020-10-17 NOTE — ED Provider Notes (Signed)
[place]    CSN: [id number] Arrival date & time: 10/15/20  1922      History   Chief Complaint Chief Complaint  Patient presents with   Abdominal Cramping    HPI Adriana Burke is a 50 y.o. female.   Patient presenting today with multiple complaints.  She states that she lost her primary care provider when she lost insurance several months back and has not been able to get her medications refilled or be seen in many months.  She has been out of her insulin for about 2 months now, has not been following her home blood sugars but knows that they are very high based on her symptoms such as thirst, frequent urination, fatigue.  She notes that she has been significantly constipated the past few months as well which is not her baseline and has been having bloating and cramping in the lower abdomen bilaterally, sometimes radiating up into the flanks.  She denies nausea, vomiting, fevers, early satiety, known chronic GI issues.  She also started with a  itchy rash that is burning, tingling and very painful to the touch on the right lower abdomen.  Using hydrocortisone cream with no relief.  This started about 2 days ago and has been worsening.   Past Medical History:  Diagnosis Date   Asthma    Chronic rhinitis    Depression    Diabetes mellitus    Episodic recurrent vertigo    Fibromyalgia    History of hip fracture    hx childhood hip fracture   Hyperlipidemia    Obesity    Peripheral neuropathy    Rheumatoid arthritis(714.0)    seen by Rheumatology in [facility], Rheumatoid Factor pos, Anti CCP pos in 2005 -? early presentation of RF   Tinnitus    history of, Evaluated by ENT> [place]   Vitamin D deficiency    history of   Patient Active Problem List   Diagnosis Date Noted   Acute cystitis without hematuria 09/16/2014   Encounter for immunization 02/24/2014   Fibrocystic changes of right breast 12/03/2013   Bilateral hip pain 10/14/2013   Alopecia areata  09/16/2013   Impaired hearing rt ear 01/02/2013   Abnormality of gait 06/12/2012   Osteoarthritis of left hip 05/31/2011   Chronic generalized pain 04/01/2011   Diabetic polyneuropathy ([place]) 02/24/2010   FIBROMYALGIA 10/29/2009   WRIST PAIN, BILATERAL 11/16/2008   Vitamin D deficiency 07/24/2008   RHEUMATOID ARTHRITIS 04/17/2008   HYPERLIPIDEMIA 04/08/2008   Type II diabetes mellitus with manifestations, uncontrolled ([place]) 01/08/2008   KNEE PAIN, BILATERAL 08/23/2006   DEPRESSION 04/10/2006   ASTHMA 01/08/2006    Past Surgical History:  Procedure Laterality Date   CESAREAN SECTION      OB History     Gravida  6   Para  6   Term  6   Preterm      AB      Living  6      SAB      IAB      Ectopic      Multiple  1   Live Births               Home Medications    Prior to Admission medications   Medication Sig Start Date End Date Taking? Authorizing Provider  gabapentin (NEURONTIN) 300 MG capsule Take 1 capsule (300 mg total) by mouth 3 (three) times daily. 10/15/20  Yes Volney American, PA-C  triamcinolone  cream (KENALOG) 0.1 % Apply 1 application topically 2 (two) times daily. 10/15/20  Yes Volney American, PA-C  valACYclovir (VALTREX) 1000 MG tablet Take 1 tablet (1,000 mg total) by mouth 3 (three) times daily for 7 days. 10/15/20 10/22/20 Yes Volney American, PA-C  albuterol (PROVENTIL HFA;VENTOLIN HFA) 108 (90 BASE) MCG/ACT inhaler Inhale 2 puffs into the lungs every 6 (six) hours as needed for wheezing or shortness of breath. 10/14/13   Corky Sox, MD  albuterol (PROVENTIL HFA;VENTOLIN HFA) 108 (90 Base) MCG/ACT inhaler Inhale 1-2 puffs into the lungs every 6 (six) hours as needed for wheezing or shortness of breath. 11/06/16   Lysbeth Penner, FNP  cyclobenzaprine (FLEXERIL) 5 MG tablet Take 1 tablet (5 mg total) by mouth 3 (three) times daily as needed for muscle spasms. 04/06/20   Loura Halt A, NP  gabapentin (NEURONTIN) 600 MG  tablet Take one tablet in morning, one tablet afternoon, and two tablets at night 12/11/16 12/11/17  Chundi, Verne Spurr, MD  gabapentin (NEURONTIN) 600 MG tablet Take 600 mg by mouth 3 (three) times daily.    [provider]  insulin glargine (LANTUS SOLOSTAR) 100 UNIT/ML Solostar Pen Inject 45 Units into the skin daily. 10/15/20   Volney American, PA-C  naproxen (NAPROSYN) 500 MG tablet Take 1 tablet (500 mg total) by mouth 2 (two) times daily with a meal. 07/03/20   Jaynee Eagles, PA-C  NON [facility]  Peripheral Neuropathy Cream- Bupivacaine 1%, Doxepin 3%, Gabapentin 6%, Pentoxifylline 3%, Topiramate 1% Apply 1-2 grams to affected area 3-4 times daily Qty. 120 gm 3 refills    [provider]  polyethylene glycol powder (GLYCOLAX/MIRALAX) 17 GM/SCOOP powder DISSOLVE 17 GRAMS IN 8 OZ OF FLUID LIQUID DRINK DAILY AS DIRECTED 10/16/20   Volney American, PA-C  pregabalin (LYRICA) 100 MG capsule Take 1 capsule (100 mg total) by mouth 3 (three) times daily. 09/26/17 04/06/20  Edrick Kins, DPM    Family History Family History  Problem Relation Age of Onset   Hypertension Mother    Diabetes Maternal Grandmother    Arthritis Maternal Grandmother    Kidney disease Maternal Grandmother     Social History Social History   Tobacco Use   Smoking status: Never   Smokeless tobacco: Never  Vaping Use   Vaping Use: Never used  Substance Use Topics   Alcohol use: Yes    Alcohol/week: 0.0 standard drinks    Comment: occasionally   Drug use: No     Allergies   Patient has no known allergies.   Review of Systems Review of Systems Per HPI  Physical Exam Triage Vital Signs ED Triage Vitals  Enc Vitals Group     BP 10/15/20 1942 (!) 151/95     Pulse Rate 10/15/20 1942 90     Resp 10/15/20 1942 18     Temp 10/15/20 1942 98.5 F (36.9 C)     Temp Source 10/15/20 1942 Oral     SpO2 10/15/20 1942 100 %     Weight --      Height --      Head  Circumference --      Peak Flow --      Pain Score 10/15/20 1940 8     Pain Loc --      Pain Edu? --      Excl. in [place]? --    No data found.  Updated Vital Signs BP (!) 151/95 (BP Location: Right Arm)  Pulse 90   Temp 98.5 F (36.9 C) (Oral)   Resp 18   LMP 08/10/2020 (Approximate) Comment: spotting  SpO2 100%   Visual Acuity Right Eye Distance:   Left Eye Distance:   Bilateral Distance:    Right Eye Near:   Left Eye Near:    Bilateral Near:     Physical Exam Vitals and nursing note reviewed.  Constitutional:      Appearance: Normal appearance. She is not ill-appearing.  HENT:     Head: Atraumatic.     Mouth/Throat:     Mouth: Mucous membranes are moist.     Pharynx: Oropharynx is clear.  Eyes:     Extraocular Movements: Extraocular movements intact.     Conjunctiva/sclera: Conjunctivae normal.  Cardiovascular:     Rate and Rhythm: Normal rate and regular rhythm.     Heart sounds: Normal heart sounds.  Pulmonary:     Effort: Pulmonary effort is normal.     Breath sounds: Normal breath sounds.  Abdominal:     General: Bowel sounds are normal. There is no distension.     Palpations: Abdomen is soft.     Tenderness: There is abdominal tenderness. There is no right CVA tenderness or guarding.     Comments: Mild bilateral lower abdominal tenderness to palpation, no masses or guarding, no distention, no rebound  Musculoskeletal:        General: Normal range of motion.     Cervical back: Normal range of motion and neck supple.  Skin:    General: Skin is warm.     Findings: Rash present.     Comments: Erythematous macular and vesicular rash not crossing the midline on the right lower abdomen.  Tender to palpation.  Neurological:     Mental Status: She is alert and oriented to person, place, and time.  Psychiatric:        Mood and Affect: Mood normal.        Thought Content: Thought content normal.        Judgment: Judgment normal.     UC Treatments / Results   Labs (all labs ordered are listed, but only abnormal results are displayed) Labs Reviewed  CBC WITH DIFFERENTIAL/PLATELET - Abnormal; Notable for the following components:      Result Value   RBC 5.58 (*)    MCV 70.8 (*)    MCH 22.8 (*)    All other components within normal limits  COMPREHENSIVE METABOLIC PANEL - Abnormal; Notable for the following components:   Sodium 127 (*)    Chloride 95 (*)    Glucose, Bld 362 (*)    Calcium 8.8 (*)    Albumin 3.0 (*)    All other components within normal limits  POCT URINALYSIS DIPSTICK, ED / UC - Abnormal; Notable for the following components:   Glucose, UA >=1000 (*)    Hgb urine dipstick TRACE (*)    Protein, ur >=300 (*)    All other components within normal limits  CBG MONITORING, ED - Abnormal; Notable for the following components:   Glucose-Capillary 340 (*)    All other components within normal limits  POC URINE PREG, ED    EKG   Radiology No results found.  Procedures Procedures (including critical care time)  Medications Ordered in UC Medications - No data to display  Initial Impression / Assessment and Plan / UC Course  I have reviewed the triage vital signs and the nursing notes.  Pertinent labs & imaging results  that were available during my care of the patient were reviewed by me and considered in my medical decision making (see chart for details).   Point-of-care blood sugar today 340, secondary to her being out of her insulin for the past 2 months.  We will restart her Lantus at her previous dose of 45 units at bedtime.  We will also address her constipation with increased hydration, MiraLAX daily and hope that additionally getting her blood sugars under control will help this issue as well.  We will start Valtrex and restart her gabapentin as suspect the rash on her abdomen is shingles.  Triamcinolone given for further management as she cannot tolerate oral steroids due to her significantly uncontrolled diabetes.   She is working on establishing care with a primary care provider and plans to be seen very soon.  She is due for a colonoscopy which is strongly recommended to soon as possible given her bowel changes.  Follow-up in the next few weeks if unable to establish with a primary care provider to recheck levels.  Labs pending for further rule out and safety.  Final Clinical Impressions(s) / UC Diagnoses   Final diagnoses:  Abdominal cramping  Constipation, unspecified constipation type  Uncontrolled type 2 diabetes mellitus with hyperglycemia ([place])  Rash   Discharge Instructions   None    ED Prescriptions     Medication Sig Dispense Auth. Provider   gabapentin (NEURONTIN) 300 MG capsule Take 1 capsule (300 mg total) by mouth 3 (three) times daily. 90 capsule Volney American, [place]   valACYclovir (VALTREX) 1000 MG tablet Take 1 tablet (1,000 mg total) by mouth 3 (three) times daily for 7 days. 21 tablet Volney American, [place]   triamcinolone cream (KENALOG) 0.1 % Apply 1 application topically 2 (two) times daily. 80 g Volney American, [place]   insulin glargine (LANTUS SOLOSTAR) 100 UNIT/ML Solostar Pen Inject 45 Units into the skin daily. 15 mL Volney American, PA-C   polyethylene glycol (MIRALAX) 17 g packet Take 17 g by mouth daily. 2 each Volney American, PA-C      PDMP not reviewed this encounter.   Volney American, [place] 10/17/20 1335

## 2020-10-21 ENCOUNTER — Telehealth (HOSPITAL_COMMUNITY): Payer: Self-pay | Admitting: Emergency Medicine

## 2020-10-21 NOTE — Telephone Encounter (Signed)
Patient called for review of results from recent visit.  Reviewed results and explained rationale for needing to get established with a PCP.  Explained that I forwarded her chart, and that she can also look on her own.  Patient verbalized understanding of plan, no questions at this time

## 2020-11-08 ENCOUNTER — Other Ambulatory Visit: Payer: Self-pay | Admitting: Family Medicine

## 2020-11-09 NOTE — Telephone Encounter (Signed)
prescribed at [facility]- 10/15/20 #15 1 RF

## 2020-11-24 ENCOUNTER — Other Ambulatory Visit: Payer: Self-pay

## 2020-11-24 ENCOUNTER — Ambulatory Visit: Payer: Self-pay

## 2020-11-24 ENCOUNTER — Ambulatory Visit
Admission: EM | Admit: 2020-11-24 | Discharge: 2020-11-24 | Disposition: A | Discharge status: Other Acute Inpatient Hospital | Payer: 59

## 2020-12-17 DIAGNOSIS — E1169 Type 2 diabetes mellitus with other specified complication: Secondary | ICD-10-CM | POA: Insufficient documentation

## 2020-12-17 DIAGNOSIS — E119 Type 2 diabetes mellitus without complications: Secondary | ICD-10-CM | POA: Insufficient documentation

## 2020-12-17 DIAGNOSIS — I152 Hypertension secondary to endocrine disorders: Secondary | ICD-10-CM | POA: Insufficient documentation

## 2020-12-28 ENCOUNTER — Other Ambulatory Visit: Payer: Self-pay | Admitting: Family Medicine

## 2020-12-28 NOTE — Telephone Encounter (Signed)
Requested Prescriptions  Refused Prescriptions Disp Refills  . LANTUS SOLOSTAR 100 UNIT/ML Solostar Pen [Pharmacy Med Name: LANTUS SOLOSTAR 100 UNIT/ML]  1    Sig: INJECT 45 UNITS INTO THE SKIN DAILY.     Endocrinology:  Diabetes - Insulins Failed - 12/28/2020 10:32 AM      Failed - HBA1C is between 0 and 7.9 and within 180 days    HbA1c POC (<> result, manual entry)  Date Value Ref Range Status  10/17/2018 >14.0 (A) 4.0 - 5.6 % Final         Failed - Valid encounter within last 6 months    Recent Outpatient Visits   None

## 2021-10-07 ENCOUNTER — Ambulatory Visit
Admission: EM | Admit: 2021-10-07 | Discharge: 2021-10-07 | Disposition: A | Discharge status: Home / Self Care | Payer: Commercial Managed Care - HMO | Attending: Urgent Care | Admitting: Urgent Care

## 2021-10-07 ENCOUNTER — Encounter: Payer: Self-pay | Admitting: Emergency Medicine

## 2021-10-07 DIAGNOSIS — I160 Hypertensive urgency: Secondary | ICD-10-CM | POA: Diagnosis not present

## 2021-10-07 DIAGNOSIS — I10 Essential (primary) hypertension: Secondary | ICD-10-CM

## 2021-10-07 DIAGNOSIS — E119 Type 2 diabetes mellitus without complications: Secondary | ICD-10-CM | POA: Diagnosis not present

## 2021-10-07 DIAGNOSIS — R609 Edema, unspecified: Secondary | ICD-10-CM | POA: Diagnosis not present

## 2021-10-07 DIAGNOSIS — Z794 Long term (current) use of insulin: Secondary | ICD-10-CM

## 2021-10-07 LAB — POCT FASTING CBG KUC MANUAL ENTRY: POCT Glucose (KUC): 191 mg/dL — AB (ref 70–99)

## 2021-10-07 MED ORDER — LOSARTAN POTASSIUM-HCTZ 50-12.5 MG PO TABS: 1.0000 | ORAL_TABLET | Freq: Every day | ORAL | 0 refills | Status: DC

## 2021-10-07 NOTE — ED Provider Notes (Signed)
[place]   MRN: [id number] DOB: 01-Aug-1970  Subjective:   [name] is a 51 y.o. female presenting for persistent lower leg swelling, help with her blood pressure.  Patient had a primary care provider and was supposed to be seen today but was turned away as she was advised that she had insurance that was not excepted by the clinic.  She is currently taking amlodipine daily.  She is also a type II diabetic treated with insulin and is on Lantus.  Has a history of diabetic polyneuropathy and chronic generalized pain.  Does not follow a diabetic friendly or hypertensive friendly diet.  Patient is not a smoker.  Has rare alcohol drink.  No current facility-administered medications for this encounter.  Current Outpatient Medications:    albuterol (PROVENTIL HFA;VENTOLIN HFA) 108 (90 BASE) MCG/ACT inhaler, Inhale 2 puffs into the lungs every 6 (six) hours as needed for wheezing or shortness of breath., Disp: 1 Inhaler, Rfl: 5   albuterol (PROVENTIL HFA;VENTOLIN HFA) 108 (90 Base) MCG/ACT inhaler, Inhale 1-2 puffs into the lungs every 6 (six) hours as needed for wheezing or shortness of breath., Disp: 1 Inhaler, Rfl: 0   cyclobenzaprine (FLEXERIL) 5 MG tablet, Take 1 tablet (5 mg total) by mouth 3 (three) times daily as needed for muscle spasms., Disp: 30 tablet, Rfl: 0   gabapentin (NEURONTIN) 300 MG capsule, Take 1 capsule (300 mg total) by mouth 3 (three) times daily., Disp: 90 capsule, Rfl: 0   gabapentin (NEURONTIN) 600 MG tablet, Take one tablet in morning, one tablet afternoon, and two tablets at night, Disp: 120 tablet, Rfl: 2   gabapentin (NEURONTIN) 600 MG tablet, Take 600 mg by mouth 3 (three) times daily., Disp: , Rfl:    insulin glargine (LANTUS SOLOSTAR) 100 UNIT/ML Solostar Pen, Inject 45 Units into the skin daily., Disp: 15 mL, Rfl: 1   naproxen (NAPROSYN) 500 MG tablet, Take 1 tablet (500 mg total) by mouth 2 (two) times daily with a meal., Disp: 30  tablet, Rfl: 0   NON FORMULARY, [facility]  Peripheral Neuropathy Cream- Bupivacaine 1%, Doxepin 3%, Gabapentin 6%, Pentoxifylline 3%, Topiramate 1% Apply 1-2 grams to affected area 3-4 times daily Qty. 120 gm 3 refills, Disp: , Rfl:    polyethylene glycol powder (GLYCOLAX/MIRALAX) 17 GM/SCOOP powder, DISSOLVE 17 GRAMS IN 8 OZ OF FLUID LIQUID DRINK DAILY AS DIRECTED, Disp: 510 g, Rfl: 2   triamcinolone cream (KENALOG) 0.1 %, Apply 1 application topically 2 (two) times daily., Disp: 80 g, Rfl: 0   No Known Allergies  Past Medical History:  Diagnosis Date   Asthma    Chronic rhinitis    Depression    Diabetes mellitus    Episodic recurrent vertigo    Fibromyalgia    History of hip fracture    hx childhood hip fracture   Hyperlipidemia    Obesity    Peripheral neuropathy    Rheumatoid arthritis(714.0)    seen by Rheumatology in [facility], Rheumatoid Factor pos, Anti CCP pos in 2005 -? early presentation of RF   Tinnitus    history of, Evaluated by ENT> [place]   Vitamin D deficiency    history of     Past Surgical History:  Procedure Laterality Date   CESAREAN SECTION      Family History  Problem Relation Age of Onset   Hypertension Mother    Diabetes Maternal Grandmother    Arthritis Maternal Grandmother    Kidney disease  Maternal Grandmother     Social History   Tobacco Use   Smoking status: Never   Smokeless tobacco: Never  Vaping Use   Vaping Use: Never used  Substance Use Topics   Alcohol use: Yes    Alcohol/week: 0.0 standard drinks of alcohol    Comment: occasionally   Drug use: No    ROS   Objective:   Vitals: BP (!) 178/100   Pulse 96   Temp 98.1 F (36.7 C)   Resp 20   SpO2 98%   BP Readings from Last 3 Encounters:  10/07/21 (!) 178/100  10/15/20 (!) 151/95  07/03/20 (!) 125/112   Physical Exam Constitutional:      General: She is not in acute distress.    Appearance: Normal appearance. She is well-developed. She is not  ill-appearing, toxic-appearing or diaphoretic.  HENT:     Head: Normocephalic and atraumatic.     Nose: Nose normal.     Mouth/Throat:     Mouth: Mucous membranes are moist.     Pharynx: No oropharyngeal exudate or posterior oropharyngeal erythema.  Eyes:     General: No scleral icterus.       Right eye: No discharge.        Left eye: No discharge.     Extraocular Movements: Extraocular movements intact.     Conjunctiva/sclera: Conjunctivae normal.     Pupils: Pupils are equal, round, and reactive to light.  Cardiovascular:     Rate and Rhythm: Normal rate and regular rhythm.     Heart sounds: Normal heart sounds. No murmur heard.    No friction rub. No gallop.  Pulmonary:     Effort: Pulmonary effort is normal. No respiratory distress.     Breath sounds: No stridor. No wheezing, rhonchi or rales.  Chest:     Chest wall: No tenderness.  Musculoskeletal:     Right lower leg: Edema present.     Left lower leg: Edema present.  Skin:    General: Skin is warm and dry.  Neurological:     General: No focal deficit present.     Mental Status: She is alert and oriented to person, place, and time.     Cranial Nerves: No cranial nerve deficit.     Motor: No weakness.     Coordination: Coordination normal.     Gait: Gait normal.     Deep Tendon Reflexes: Reflexes normal.     Comments: Negative Romberg and pronator drift, no facial asymmetry.  Psychiatric:        Mood and Affect: Mood normal.        Behavior: Behavior normal.        Thought Content: Thought content normal.        Judgment: Judgment normal.     Results for orders placed or performed during the hospital encounter of 10/07/21 (from the past 24 hour(s))  POCT CBG (manual entry)     Status: Abnormal   Collection Time: 10/07/21  4:08 PM  Result Value Ref Range   POCT Glucose ([facility]) 191 (A) 70 - 99 mg/dL   Last A1c from chart review was 11% in May 07/23/2021.  Creatinine level was 1.02 with a GFR of 67 mL/min from the  same date.  Assessment and Plan :   PDMP not reviewed this encounter.  1. Peripheral edema   2. Essential hypertension   3. Hypertensive urgency   4. Type 2 diabetes mellitus treated with insulin ([place])  Stop amlodipine. Switch to los-HCTZ. Emphasized need to practice a diabetic friendly and hypertensive friendly diet.  No signs of an acute encephalopathy.  Low suspicion for congestive heart failure given clear cardiopulmonary exam.  Provided her with information to establish care with a new primary care provider. Counseled patient on potential for adverse effects with medications prescribed/recommended today, ER and return-to-clinic precautions discussed, patient verbalized understanding.    Jaynee Eagles, [place] 10/07/21 [phone]

## 2021-10-07 NOTE — ED Triage Notes (Signed)
Pt here with bilateral leg swelling that is very painful x several months along with uncontrolled hypertension and diabetes. Pt was just told that her insurance is not accepted at her PCP today.

## 2021-10-07 NOTE — Discharge Instructions (Addendum)
Stop amlodipine. Start losartan hydrochlorothiazide for your blood pressure instead.   For diabetes or elevated blood sugar, please make sure you are limiting and avoiding starchy, carbohydrate foods like pasta, breads, sweet breads, pastry, rice, potatoes, desserts. These foods can elevate your blood sugar. Also, limit and avoid drinks that contain a lot of sugar such as sodas, sweet teas, fruit juices.  Drinking plain water will be much more helpful, try 64 ounces of water daily.  It is okay to flavor your water naturally by cutting cucumber, lemon, mint or lime, placing it in a picture with water and drinking it over a period of 24-48 hours as long as it remains refrigerated.  For elevated blood pressure, make sure you are monitoring salt in your diet.  Do not eat restaurant foods and limit processed foods at home. I highly recommend you prepare and cook your own foods at home.  Processed foods include things like frozen meals, pre-seasoned meats and dinners, deli meats, canned foods as these foods contain a high amount of sodium/salt.  Make sure you are paying attention to sodium labels on foods you buy at the grocery store. Buy your spices separately such as garlic powder, onion powder, cumin, cayenne, parsley flakes so that you can avoid seasonings that contain salt. However, salt-free seasonings are available and can be used, an example is Mrs. Dash and includes a lot of different mixtures that do not contain salt.  Lastly, when cooking using oils that are healthier for you is important. This includes olive oil, avocado oil, canola oil. We have discussed a lot of foods to avoid but below is a list of foods that can be very healthy to use in your diet whether it is for diabetes, cholesterol, high blood pressure, or in general healthy eating.  Salads - kale, spinach, cabbage, spring mix, arugula Fruits - avocadoes, berries (blueberries, raspberries, blackberries), apples, oranges, pomegranate,  grapefruit, kiwi Vegetables - asparagus, cauliflower, broccoli, green beans, brussel sprouts, bell peppers, beets; stay away from or limit starchy vegetables like potatoes, carrots, peas Other general foods - kidney beans, egg whites, almonds, walnuts, sunflower seeds, pumpkin seeds, fat free yogurt, almond milk, flax seeds, quinoa, oats  Meat - It is better to eat lean meats and limit your red meat including pork to once a week.  Wild caught fish, chicken breast are good options as they tend to be leaner sources of good protein. Still be mindful of the sodium labels for the meats you buy.  DO NOT EAT ANY FOODS ON THIS LIST THAT YOU ARE ALLERGIC TO. For more specific needs, I highly recommend consulting a dietician or nutritionist but this can definitely be a good starting point.

## 2021-10-17 ENCOUNTER — Other Ambulatory Visit (HOSPITAL_COMMUNITY): Payer: Self-pay

## 2021-10-17 ENCOUNTER — Encounter: Payer: Self-pay | Admitting: Internal Medicine

## 2021-10-17 ENCOUNTER — Other Ambulatory Visit: Payer: Self-pay

## 2021-10-17 ENCOUNTER — Ambulatory Visit (INDEPENDENT_AMBULATORY_CARE_PROVIDER_SITE_OTHER): Payer: Commercial Managed Care - HMO | Admitting: Internal Medicine

## 2021-10-17 VITALS — BP 139/93 | HR 88 | Temp 97.7°F | Ht 65.0 in | Wt 151.5 lb

## 2021-10-17 DIAGNOSIS — E119 Type 2 diabetes mellitus without complications: Secondary | ICD-10-CM

## 2021-10-17 DIAGNOSIS — I10 Essential (primary) hypertension: Secondary | ICD-10-CM | POA: Diagnosis not present

## 2021-10-17 DIAGNOSIS — E785 Hyperlipidemia, unspecified: Secondary | ICD-10-CM

## 2021-10-17 DIAGNOSIS — E1169 Type 2 diabetes mellitus with other specified complication: Secondary | ICD-10-CM

## 2021-10-17 DIAGNOSIS — Z0000 Encounter for general adult medical examination without abnormal findings: Secondary | ICD-10-CM | POA: Insufficient documentation

## 2021-10-17 DIAGNOSIS — E1159 Type 2 diabetes mellitus with other circulatory complications: Secondary | ICD-10-CM

## 2021-10-17 DIAGNOSIS — Z23 Encounter for immunization: Secondary | ICD-10-CM

## 2021-10-17 DIAGNOSIS — Z1211 Encounter for screening for malignant neoplasm of colon: Secondary | ICD-10-CM

## 2021-10-17 DIAGNOSIS — Z794 Long term (current) use of insulin: Secondary | ICD-10-CM

## 2021-10-17 DIAGNOSIS — I152 Hypertension secondary to endocrine disorders: Secondary | ICD-10-CM

## 2021-10-17 LAB — POCT GLYCOSYLATED HEMOGLOBIN (HGB A1C): Hemoglobin A1C: 10.7 % — AB (ref 4.0–5.6)

## 2021-10-17 LAB — GLUCOSE, CAPILLARY: Glucose-Capillary: 216 mg/dL — ABNORMAL HIGH (ref 70–99)

## 2021-10-17 MED ORDER — PEN NEEDLES 32G X 4 MM MISC
1.0000 | Freq: Every day | 1 refills | Status: DC
  Filled 2021-10-17: qty 100, 90d supply, fill #0

## 2021-10-17 MED ORDER — BASAGLAR KWIKPEN 100 UNIT/ML ~~LOC~~ SOPN: 45.0000 [IU] | PEN_INJECTOR | Freq: Every day | SUBCUTANEOUS | 1 refills | Status: DC

## 2021-10-17 MED ORDER — ATORVASTATIN CALCIUM 80 MG PO TABS
80.0000 mg | ORAL_TABLET | Freq: Every day | ORAL | 3 refills | Status: AC
Start: 2021-10-17 — End: ?
  Filled 2021-10-17 – 2022-01-05 (×2): qty 90, 90d supply, fill #0

## 2021-10-17 MED ORDER — GLUCOSE BLOOD VI STRP
ORAL_STRIP | Freq: Two times a day (BID) | 12 refills | Status: AC
  Filled 2021-10-17: qty 50, 25d supply, fill #0

## 2021-10-17 MED ORDER — TRULICITY 0.75 MG/0.5ML ~~LOC~~ SOAJ: 0.7500 mg | SUBCUTANEOUS | 3 refills | Status: DC

## 2021-10-17 MED ORDER — BASAGLAR KWIKPEN 100 UNIT/ML ~~LOC~~ SOPN
45.0000 [IU] | PEN_INJECTOR | Freq: Every day | SUBCUTANEOUS | 1 refills | Status: DC
  Filled 2021-10-17: qty 15, 33d supply, fill #0
  Filled 2022-01-05: qty 15, 33d supply, fill #1

## 2021-10-17 MED ORDER — LOSARTAN POTASSIUM-HCTZ 50-12.5 MG PO TABS
1.0000 | ORAL_TABLET | Freq: Every day | ORAL | 3 refills | Status: DC
  Filled 2021-10-17 – 2022-01-05 (×2): qty 90, 90d supply, fill #0

## 2021-10-17 MED ORDER — FARXIGA 10 MG PO TABS
10.0000 mg | ORAL_TABLET | Freq: Every day | ORAL | 3 refills | Status: DC
  Filled 2021-10-17: qty 30, 30d supply, fill #0

## 2021-10-17 MED ORDER — TRULICITY 0.75 MG/0.5ML ~~LOC~~ SOAJ
0.7500 mg | SUBCUTANEOUS | 6 refills | Status: DC
  Filled 2021-10-17: qty 2, 28d supply, fill #0

## 2021-10-17 MED ORDER — ATORVASTATIN CALCIUM 80 MG PO TABS: 80.0000 mg | ORAL_TABLET | Freq: Every day | ORAL | 3 refills | Status: DC

## 2021-10-17 MED ORDER — LOSARTAN POTASSIUM-HCTZ 50-12.5 MG PO TABS: 1.0000 | ORAL_TABLET | Freq: Every day | ORAL | 0 refills | Status: DC

## 2021-10-17 MED ORDER — GLUCOSE BLOOD VI STRP: ORAL_STRIP | 12 refills | Status: DC

## 2021-10-17 MED ORDER — DULOXETINE HCL 30 MG PO CPEP
ORAL_CAPSULE | ORAL | 2 refills | Status: DC
Start: 2021-10-17 — End: 2021-11-14
  Filled 2021-10-17: qty 30, 22d supply, fill #0

## 2021-10-17 MED ORDER — FARXIGA 10 MG PO TABS: 10.0000 mg | ORAL_TABLET | Freq: Every day | ORAL | 3 refills | Status: DC

## 2021-10-17 MED ORDER — DULOXETINE HCL 30 MG PO CPEP: ORAL_CAPSULE | ORAL | 2 refills | Status: DC

## 2021-10-17 NOTE — Addendum Note (Signed)
Addended by: Buddy Duty on: 10/17/2021 02:32 PM   Modules accepted: Orders

## 2021-10-17 NOTE — Assessment & Plan Note (Signed)
The patient is supposed to be on atorvastatin 80 mg daily, however she has not been taking this medicine, as she has had difficulties affording her medications.  We are going to send her medications to the [facility] outpatient pharmacy to see if they are more affordable there.  Plan: - Atorvastatin 80 mg daily - Lipid profile today

## 2021-10-17 NOTE — Assessment & Plan Note (Signed)
The patient presents to the [facility] to reestablish care with [name] after being seen at [facility] for the past 3 years ([facility] no longer takes her insurance).  3 years ago, the patient's A1c was greater than 14%.  In May, her A1c was noted to be 11.0% and today it is 10.7%.  Her A1c is still above goal, but it has been downtrending over the last few years.  She has previously been prescribed Lantus 45 units daily, Humalog sliding scale with meals, Trulicity 5.72 mg/week, and Farxiga 10 mg daily.  The patient has only been able to take her Lantus each day, as the medications have not been affordable and have had large co-pays.  She does have a glucose meter at home, but she does not have the correct test strips and has not been able to check her blood sugars because of this.  Additionally, she endorses having a burning sensation/numbness and tingling in her feet-she has previously been prescribed gabapentin, however, she states that this medication has not given her any relief, thus she has stopped taking it.  Plan: - Continue lantus 45u daily (will actually substitute for glargine, as it is preferred by her insurance) - Restart Trulicity 6.20 mg/week; can increase if patient tolerates it well and if it is affordable - Restart Farxiga 10 mg daily - Hold off on restarting Humalog - Start Duloxetine 30 mg daily; uptitrate to 60 mg daily in 2 weeks - Follow up in 6 weeks to recheck sugars + assess response to Duloxetine - Diabetic foot exam performed today - Referral to ophthalmology

## 2021-10-17 NOTE — Assessment & Plan Note (Signed)
Patient was previously taking amlodipine 5 mg daily for her hypertension, but was recently stopped of this medication after having lower extremity edema.  She was switched to losartan-HCTZ 50-12.5 mg daily and has been doing well with this medication.  The patient states that her lower extremity edema is improved since stopping amlodipine.  Her blood pressure today is 139/93, which is significantly improved, as her systolic blood pressure is often in the 150s to 160s.   Plan: - BMP today - Continue losartan-HCTZ 50-12.5 mg daily; can increase to 100-25 mg daily if BP remains above goal

## 2021-10-17 NOTE — Assessment & Plan Note (Signed)
-   Tdap updated today - The patient reported that she had a mammogram done a few months ago (need to obtain records) - Referral to GI for screening colonoscopy

## 2021-10-17 NOTE — Progress Notes (Signed)
CC: re-establish care (last seen in [facility] in 2020)  HPI:  Adriana Burke is a 51 y.o. female living with a history stated below and presents today to re-establish care with the [facility]. Please see problem based assessment and plan for additional details.  Past Medical History:  Diagnosis Date   Asthma    Chronic rhinitis    Depression    Diabetes mellitus    Episodic recurrent vertigo    Fibromyalgia    History of hip fracture    hx childhood hip fracture   Hyperlipidemia    Obesity    Peripheral neuropathy    Rheumatoid arthritis(714.0)    seen by Rheumatology in [facility], Rheumatoid Factor pos, Anti CCP pos in 2005 -? early presentation of RF   Tinnitus    history of, Evaluated by ENT> [place]   Type II diabetes mellitus with manifestations, uncontrolled 01/08/2008   Annotation: likely T2DM high CBGs anf HbA1C of (7.5 on 01/2008) Qualifier: Diagnosis of  By: Cruzita Lederer MD, Annamarie Major D deficiency    history of    Current Outpatient Medications on File Prior to Visit  Medication Sig Dispense Refill   albuterol (PROVENTIL HFA;VENTOLIN HFA) 108 (90 Base) MCG/ACT inhaler Inhale 1-2 puffs into the lungs every 6 (six) hours as needed for wheezing or shortness of breath. 1 Inhaler 0   naproxen (NAPROSYN) 500 MG tablet Take 1 tablet (500 mg total) by mouth 2 (two) times daily with a meal. 30 tablet 0   NON FORMULARY [facility]  Peripheral Neuropathy Cream- Bupivacaine 1%, Doxepin 3%, Gabapentin 6%, Pentoxifylline 3%, Topiramate 1% Apply 1-2 grams to affected area 3-4 times daily Qty. 120 gm 3 refills     polyethylene glycol powder (GLYCOLAX/MIRALAX) 17 GM/SCOOP powder DISSOLVE 17 GRAMS IN 8 OZ OF FLUID LIQUID DRINK DAILY AS DIRECTED 510 g 2   triamcinolone cream (KENALOG) 0.1 % Apply 1 application topically 2 (two) times daily. 80 g 0   [DISCONTINUED] pregabalin (LYRICA) 100 MG capsule Take 1 capsule (100 mg total) by mouth 3 (three) times daily. 90 capsule 2    No current facility-administered medications on file prior to visit.    Family History  Problem Relation Age of Onset   Hypertension Mother    Diabetes Maternal Grandmother    Arthritis Maternal Grandmother    Kidney disease Maternal Grandmother     Social History   Socioeconomic History   Marital status: Legally Separated    Spouse name: Not on file   Number of children: Not on file   Years of education: 11   Highest education level: Not on file  Occupational History    Employer: [employer]  Tobacco Use   Smoking status: Never   Smokeless tobacco: Never  Vaping Use   Vaping Use: Never used  Substance and Sexual Activity   Alcohol use: Yes    Alcohol/week: 0.0 standard drinks of alcohol    Comment: occasionally   Drug use: No   Sexual activity: Not on file  Other Topics Concern   Not on file  Social History Narrative   [phone]   Social Determinants of Health   Financial Resource Strain: Not on file  Food Insecurity: Not on file  Transportation Needs: Not on file  Physical Activity: Not on file  Stress: Not on file  Social Connections: Not on file  Intimate Partner Violence: Not on file    Review of Systems: ROS negative except for what is  noted on the assessment and plan.  Vitals:   10/17/21 1049  BP: (!) 139/93  Pulse: 88  Temp: 97.7 F (36.5 C)  TempSrc: Oral  SpO2: 99%  Weight: 151 lb 8 oz (68.7 kg)  Height: '5\' 5"'$  (1.651 m)    Physical Exam: Constitutional: well-appearing female sitting in chair, in no acute distress Cardiovascular: regular rate and rhythm, no m/r/g Pulmonary/Chest: normal work of breathing on room air, lungs clear to auscultation bilaterally Abdominal: soft, non-tender, non-distended, normal bowel sounds MSK: normal bulk and tone Neurological: alert & oriented x 3, no focal deficit Skin: warm and dry Psych: normal mood and behavior  Assessment & Plan:     Patient discussed with Dr.  Philipp Ovens  Hyperlipidemia associated with type 2 diabetes mellitus ([name]) The patient is supposed to be on atorvastatin 80 mg daily, however she has not been taking this medicine, as she has had difficulties affording her medications.  We are going to send her medications to the [facility] outpatient pharmacy to see if they are more affordable there.  Plan: - Atorvastatin 80 mg daily - Lipid profile today  Diabetes mellitus type 2, insulin dependent ([place]) The patient presents to the [facility] to reestablish care with [name] after being seen at [facility] for the past 3 years ([facility] no longer takes her insurance).  3 years ago, the patient's A1c was greater than 14%.  In May, her A1c was noted to be 11.0% and today it is 10.7%.  Her A1c is still above goal, but it has been downtrending over the last few years.  She has previously been prescribed Lantus 45 units daily, Humalog sliding scale with meals, Trulicity 4.33 mg/week, and Farxiga 10 mg daily.  The patient has only been able to take her Lantus each day, as the medications have not been affordable and have had large co-pays.  She does have a glucose meter at home, but she does not have the correct test strips and has not been able to check her blood sugars because of this.  Additionally, she endorses having a burning sensation/numbness and tingling in her feet-she has previously been prescribed gabapentin, however, she states that this medication has not given her any relief, thus she has stopped taking it.  Plan: - Continue lantus 45u daily (will actually substitute for glargine, as it is preferred by her insurance) - Restart Trulicity 2.95 mg/week; can increase if patient tolerates it well and if it is affordable - Restart Farxiga 10 mg daily - Hold off on restarting Humalog - Start Duloxetine 30 mg daily; uptitrate to 60 mg daily in 2 weeks - Follow up in 6 weeks to recheck sugars + assess response to Duloxetine - Diabetic foot exam performed today -  Referral to ophthalmology   Hypertension associated with type 2 diabetes mellitus ([place]) Patient was previously taking amlodipine 5 mg daily for her hypertension, but was recently stopped of this medication after having lower extremity edema.  She was switched to losartan-HCTZ 50-12.5 mg daily and has been doing well with this medication.  The patient states that her lower extremity edema is improved since stopping amlodipine.  Her blood pressure today is 139/93, which is significantly improved, as her systolic blood pressure is often in the 150s to 160s.   Plan: - BMP today - Continue losartan-HCTZ 50-12.5 mg daily; can increase to 100-25 mg daily if BP remains above goal  Healthcare maintenance - Tdap updated today - The patient reported that she had a mammogram done  a few months ago (need to obtain records) - Referral to GI for screening colonoscopy   Hernandez Losasso, D.O. [facility], PGY-2 Phone: [phone] Date 10/17/2021 Time 11:54 AM

## 2021-10-17 NOTE — Patient Instructions (Signed)
Thank you, Adriana Burke for allowing [name] to provide your care today. Today we discussed:  Diabetes: Please check your blood sugars at least once a day (preferably in the morning before you have anything to eat) Continue to take insulin glargine (like lantus) 45 units each day Restart Trulicity 6.64 mg each week. If it is too expensive please let me know and we can send in a different medicine Restart farxiga 10 mg daily. Again, if it is too expensive please let me know I have referred you to an eye doctor  High blood pressure: Keep taking losartan-HCTZ everyday!  High cholesterol: We are rechecking your cholesterol today, but you should take atorvastatin/lipitor 80 mg daily I have referred you to a gastroenterologist to get your colonoscopy (to check/screen for colon cancer) We are updating your tetanus shot today   I have ordered the following labs for you:   Lab Orders         Glucose, capillary         BMP8+Anion Gap         Lipid Profile         POC Hbg A1C       Referrals ordered today:    Referral Orders         Ambulatory referral to Ophthalmology         Ambulatory referral to Gastroenterology      I have ordered the following medication/changed the following medications:   Stop the following medications: Medications Discontinued During This Encounter  Medication Reason   albuterol (PROVENTIL HFA;VENTOLIN HFA) 108 (90 BASE) MCG/ACT inhaler    gabapentin (NEURONTIN) 300 MG capsule    gabapentin (NEURONTIN) 600 MG tablet    cyclobenzaprine (FLEXERIL) 5 MG tablet    insulin glargine (LANTUS SOLOSTAR) 100 UNIT/ML Solostar Pen Reorder   losartan-hydrochlorothiazide (HYZAAR) 50-12.5 MG tablet Reorder   TRULICITY 4.03 KV/4.2VZ SOPN Reorder   FARXIGA 10 MG TABS tablet Reorder   atorvastatin (LIPITOR) 80 MG tablet Reorder     Start the following medications: Meds ordered this encounter  Medications   glucose blood test strip    Sig: Use as instructed    Dispense:   100 each    Refill:  12   atorvastatin (LIPITOR) 80 MG tablet    Sig: Take 1 tablet (80 mg total) by mouth daily.    Dispense:  90 tablet    Refill:  3   Insulin Glargine (BASAGLAR KWIKPEN) 100 UNIT/ML    Sig: Inject 45 Units into the skin daily.    Dispense:  15 mL    Refill:  1   losartan-hydrochlorothiazide (HYZAAR) 50-12.5 MG tablet    Sig: Take 1 tablet by mouth daily.    Dispense:  90 tablet    Refill:  0   FARXIGA 10 MG TABS tablet    Sig: Take 1 tablet (10 mg total) by mouth daily.    Dispense:  30 tablet    Refill:  3   TRULICITY 5.63 OV/5.6EP SOPN    Sig: Inject 0.75 mg into the skin once a week.    Dispense:  0.5 mL    Refill:  3   DULoxetine (CYMBALTA) 30 MG capsule    Sig: Take 1 capsule (30 mg total) by mouth daily for 14 days, THEN 2 capsules (60 mg total) daily.    Dispense:  30 capsule    Refill:  2     Follow up:  6 weeks to follow up on your  diabetes and Cymbalta     Should you have any questions or concerns please call the internal medicine clinic at [phone].     Buddy Duty, D.O. [place]

## 2021-10-18 LAB — LIPID PANEL
Chol/HDL Ratio: 6.3 ratio — ABNORMAL HIGH (ref 0.0–4.4)
Cholesterol, Total: 294 mg/dL — ABNORMAL HIGH (ref 100–199)
HDL: 47 mg/dL (ref >39)
LDL Chol Calc (NIH): 224 mg/dL — ABNORMAL HIGH (ref 0–99)
Triglycerides: 126 mg/dL (ref 0–149)
VLDL Cholesterol Cal: 23 mg/dL (ref 5–40)

## 2021-10-18 LAB — BMP8+ANION GAP
Anion Gap: 12 mmol/L (ref 10.0–18.0)
BUN/Creatinine Ratio: 16 (ref 9–23)
BUN: 17 mg/dL (ref 6–24)
CO2: 23 mmol/L (ref 20–29)
Calcium: 8.5 mg/dL — ABNORMAL LOW (ref 8.7–10.2)
Chloride: 101 mmol/L (ref 96–106)
Creatinine, Ser: 1.06 mg/dL — ABNORMAL HIGH (ref 0.57–1.00)
Glucose: 196 mg/dL — ABNORMAL HIGH (ref 70–99)
Potassium: 4.2 mmol/L (ref 3.5–5.2)
Sodium: 136 mmol/L (ref 134–144)
eGFR: 64 mL/min/{1.73_m2} (ref >59)

## 2021-10-18 NOTE — Progress Notes (Signed)
Cr fluctuating between 0.9-1.06 over the last few years. Lipid panel with significantly elevated LDL to 224, raising suspicion for familial hypercholesterolemia. ASCVD risk is 20%. Discussed results with patient and stressed importance of taking atorvastatin 80 mg daily (she had been off of this medication previously due to cost barriers). The patient started this medication last night and is going to continue to take it daily. May also need to consider the addition of Zetia or a PCSK9i at next office visit.

## 2021-10-18 NOTE — Progress Notes (Signed)
Internal Medicine Clinic Attending ° °Case discussed with Dr. Atway  At the time of the visit.  We reviewed the resident’s history and exam and pertinent patient test results.  I agree with the assessment, diagnosis, and plan of care documented in the resident’s note.  °

## 2021-10-26 ENCOUNTER — Other Ambulatory Visit (HOSPITAL_COMMUNITY): Payer: Self-pay

## 2021-11-12 ENCOUNTER — Other Ambulatory Visit: Payer: Self-pay | Admitting: Internal Medicine

## 2021-11-12 DIAGNOSIS — E119 Type 2 diabetes mellitus without complications: Secondary | ICD-10-CM

## 2022-01-02 ENCOUNTER — Telehealth: Payer: Self-pay | Admitting: *Deleted

## 2022-01-02 NOTE — Telephone Encounter (Signed)
Call from pt c/o pain in her legs, arms and shooting pain in her stomach. And her feet are swollen. Also intermittent pain in her chest.  But mainly in her arms and legs per pt. Stated the pain has been going on "for a while" but getting worse. Pt requesting an appt as soon as possible - No available appts tomorrow. Advised pt to go to the ER or at least UC to be evaluated today - stated she will.

## 2022-01-03 NOTE — Telephone Encounter (Signed)
erroer

## 2022-01-05 ENCOUNTER — Other Ambulatory Visit (HOSPITAL_COMMUNITY): Payer: Self-pay

## 2022-01-18 ENCOUNTER — Encounter: Payer: Commercial Managed Care - HMO | Admitting: Student

## 2022-02-01 ENCOUNTER — Encounter: Payer: Self-pay | Admitting: Internal Medicine

## 2022-02-01 ENCOUNTER — Other Ambulatory Visit: Payer: Self-pay

## 2022-02-01 ENCOUNTER — Ambulatory Visit (INDEPENDENT_AMBULATORY_CARE_PROVIDER_SITE_OTHER): Payer: Commercial Managed Care - HMO | Admitting: Internal Medicine

## 2022-02-01 VITALS — BP 189/107 | HR 94 | Temp 97.1°F | Ht 63.0 in | Wt 157.3 lb

## 2022-02-01 DIAGNOSIS — R6889 Other general symptoms and signs: Secondary | ICD-10-CM | POA: Insufficient documentation

## 2022-02-01 DIAGNOSIS — I152 Hypertension secondary to endocrine disorders: Secondary | ICD-10-CM | POA: Diagnosis not present

## 2022-02-01 DIAGNOSIS — E1159 Type 2 diabetes mellitus with other circulatory complications: Secondary | ICD-10-CM | POA: Diagnosis not present

## 2022-02-01 DIAGNOSIS — E119 Type 2 diabetes mellitus without complications: Secondary | ICD-10-CM

## 2022-02-01 DIAGNOSIS — M67911 Unspecified disorder of synovium and tendon, right shoulder: Secondary | ICD-10-CM

## 2022-02-01 DIAGNOSIS — Z794 Long term (current) use of insulin: Secondary | ICD-10-CM | POA: Diagnosis not present

## 2022-02-01 DIAGNOSIS — I10 Essential (primary) hypertension: Secondary | ICD-10-CM | POA: Diagnosis not present

## 2022-02-01 LAB — POCT GLYCOSYLATED HEMOGLOBIN (HGB A1C): Hemoglobin A1C: 9.8 % — AB (ref 4.0–5.6)

## 2022-02-01 LAB — GLUCOSE, CAPILLARY: Glucose-Capillary: 134 mg/dL — ABNORMAL HIGH (ref 70–99)

## 2022-02-01 MED ORDER — FARXIGA 10 MG PO TABS: 10.0000 mg | ORAL_TABLET | Freq: Every day | ORAL | 3 refills | Status: AC

## 2022-02-01 MED ORDER — LOSARTAN POTASSIUM-HCTZ 50-12.5 MG PO TABS: 1.0000 | ORAL_TABLET | Freq: Every day | ORAL | 3 refills | Status: AC

## 2022-02-01 MED ORDER — TRULICITY 0.75 MG/0.5ML ~~LOC~~ SOAJ: 0.7500 mg | SUBCUTANEOUS | 6 refills | Status: AC

## 2022-02-01 MED ORDER — PEN NEEDLES 32G X 4 MM MISC: 1.0000 | Freq: Every day | 1 refills | Status: AC

## 2022-02-01 MED ORDER — BASAGLAR KWIKPEN 100 UNIT/ML ~~LOC~~ SOPN: 25.0000 [IU] | PEN_INJECTOR | Freq: Every day | SUBCUTANEOUS | 1 refills | Status: DC

## 2022-02-01 NOTE — Progress Notes (Signed)
CC: diabetes follow up  HPI:  Adriana Burke is a 51 y.o. female living with a history stated below and presents today for a follow up of diabetes. Please see problem based assessment and plan for additional details.  Past Medical History:  Diagnosis Date   Asthma    Chronic rhinitis    Depression    Diabetes mellitus    Episodic recurrent vertigo    Fibromyalgia    History of hip fracture    hx childhood hip fracture   Hyperlipidemia    Obesity    Peripheral neuropathy    Rheumatoid arthritis(714.0)    seen by Rheumatology in [facility], Rheumatoid Factor pos, Anti CCP pos in 2005 -? early presentation of RF   Tinnitus    history of, Evaluated by ENT> [place]   Type II diabetes mellitus with manifestations, uncontrolled 01/08/2008   Annotation: likely T2DM high CBGs anf HbA1C of (7.5 on 01/2008) Qualifier: Diagnosis of  By: Cruzita Lederer MD, Annamarie Major D deficiency    history of    Current Outpatient Medications on File Prior to Visit  Medication Sig Dispense Refill   albuterol (PROVENTIL HFA;VENTOLIN HFA) 108 (90 Base) MCG/ACT inhaler Inhale 1-2 puffs into the lungs every 6 (six) hours as needed for wheezing or shortness of breath. 1 Inhaler 0   atorvastatin (LIPITOR) 80 MG tablet Take 1 tablet (80 mg total) by mouth daily. 90 tablet 3   DULoxetine (CYMBALTA) 30 MG capsule Take 2 capsules (60 mg total) by mouth daily. 180 capsule 0   glucose blood test strip Use to check blood sugar 2 (two) times daily. 100 each 12   naproxen (NAPROSYN) 500 MG tablet Take 1 tablet (500 mg total) by mouth 2 (two) times daily with a meal. 30 tablet 0   NON FORMULARY [facility]  Peripheral Neuropathy Cream- Bupivacaine 1%, Doxepin 3%, Gabapentin 6%, Pentoxifylline 3%, Topiramate 1% Apply 1-2 grams to affected area 3-4 times daily Qty. 120 gm 3 refills     polyethylene glycol powder (GLYCOLAX/MIRALAX) 17 GM/SCOOP powder DISSOLVE 17 GRAMS IN 8 OZ OF FLUID LIQUID DRINK DAILY AS  DIRECTED 510 g 2   triamcinolone cream (KENALOG) 0.1 % Apply 1 application topically 2 (two) times daily. 80 g 0   [DISCONTINUED] pregabalin (LYRICA) 100 MG capsule Take 1 capsule (100 mg total) by mouth 3 (three) times daily. 90 capsule 2   No current facility-administered medications on file prior to visit.    Family History  Problem Relation Age of Onset   Hypertension Mother    Diabetes Maternal Grandmother    Arthritis Maternal Grandmother    Kidney disease Maternal Grandmother     Social History   Socioeconomic History   Marital status: Legally Separated    Spouse name: Not on file   Number of children: Not on file   Years of education: 11   Highest education level: Not on file  Occupational History    Employer: [employer]  Tobacco Use   Smoking status: Never   Smokeless tobacco: Never  Vaping Use   Vaping Use: Never used  Substance and Sexual Activity   Alcohol use: Yes    Alcohol/week: 0.0 standard drinks of alcohol    Comment: occasionally   Drug use: No   Sexual activity: Not on file  Other Topics Concern   Not on file  Social History Narrative   [phone]   Social Determinants of Health   Financial Resource Strain:  Not on file  Food Insecurity: No Food Insecurity (02/01/2022)   Hunger Vital Sign    Worried About Running Out of Food in the Last Year: Never true    Ran Out of Food in the Last Year: Never true  Transportation Needs: Not on file  Physical Activity: Not on file  Stress: Not on file  Social Connections: Moderately Integrated (02/01/2022)   Social Connection and Isolation Panel [NHANES]    Frequency of Communication with Friends and Family: More than three times a week    Frequency of Social Gatherings with Friends and Family: More than three times a week    Attends Religious Services: 1 to 4 times per year    Active Member of [employer] or Organizations: Yes    Attends Archivist Meetings: Never    Marital Status: Separated   Intimate Partner Violence: Not At Risk (02/01/2022)   Humiliation, Afraid, Rape, and Kick questionnaire    Fear of Current or Ex-Partner: No    Emotionally Abused: No    Physically Abused: No    Sexually Abused: No    Review of Systems: ROS negative except for what is noted on the assessment and plan.  Vitals:   02/01/22 1411 02/01/22 1453  BP: (!) 202/108 (!) 189/107  Pulse: 98 94  Temp: (!) 97.1 F (36.2 C)   TempSrc: Oral   SpO2: 100%   Weight: 157 lb 4.8 oz (71.4 kg)   Height: '5\' 3"'$  (1.6 m)     Physical Exam: Constitutional: well-appearing female sitting in chair, in no acute distress Cardiovascular: regular rate and rhythm, no m/r/g Pulmonary/Chest: normal work of breathing on room air, lungs clear to auscultation bilaterally MSK: normal bulk and tone. Limited abduction of R shoulder 2/2 pain, normal adduction. Negative drop arm test, negative empty can test. Lift off test unable to be done 2/2 pain. Neurological: alert & oriented x 3, no focal deficit Skin: warm and dry Psych: normal mood and behavior  Assessment & Plan:   Shoulder R and nekc pain   Patient discussed with Dr. Saverio Danker  Diabetes mellitus type 2, insulin dependent [facility]) The patient presents to the [facility] for a 80-monthfollow-up of her diabetes.  At her last visit in August, her A1c was 10.7% and today it is 9.8%. The patient has been taking glargine 45 units daily.  She states that she is not taking her Trulicity as she messed up the pen needle and has not been able to use it since then.  She also states that she is not taking her FIran With just the insulin, her fasting sugars have been in the 60s on a couple of occasion and it has made her feel diaphoretic and shaky.   We discussed reducing her insulin today to avoid future episodes of hypoglycemia.  With her elevated A1c, we also discussed the importance of starting Trulicity and FIran  Patient is in agreement with the plan, and states that she just  was unsure with what to take.  The patient has been overwhelmed, as she is the primary caregiver for her son who has many medical conditions, thus she states that her healthcare has been on the back burner.   Plan: - Urine micro today - Restart farxiga 10 mg daily - Reduce lantus to 25u daily  - Restart Trulicity .75 mg weekly, increase to 1.5 mg/week after  - Follow up in 2 weeks  Hypertension associated with type 2 diabetes mellitus ([facility]) BP Readings from Last  3 Encounters:  02/01/22 (!) 202/108  10/17/21 (!) 139/93  10/07/21 (!) 178/100   Blood pressure today significantly elevated to 202/108 and upon recheck 195/106.  At the end of the patient's visit the blood pressure was checked 1 more time and was 189/107.  The patient is on Losartan-HCTZ 50-12.5 mg daily, however she believes that she was taking a different medicine for her blood pressure (one with potassium) and thus, I do not believe she was taking the correct medicine. She denies any headache, dizziness, lightheadedness, chest pain, dyspnea. The patient has not taken her medicines today and previously, it appears that she was taking the wrong one.  Plan: -Restart losartan-HCTZ 50-12.5 mg daily -Follow-up in 2 weeks for blood pressure recheck -If blood pressure remains above goal at that time, could start amlodipine  Tendinopathy of right rotator cuff The patient states that she has had right-sided shoulder pain over the last 6 months.  6 months ago, the patient fell while trying to put her grandchild in a highchair.  She has had pain since then, and notes that it has been progressively worsening.  Currently, she is unable to fully abduct her arm on the right secondary to pain.  I do not think she tore any tendons in her rotator cuff, as she has a negative drop arm test, negative empty can test, and negative liftoff test (although we could not do this test fully secondary to pain).  She likely has a tendinopathy of her right  rotator cuff, likely exacerbated by adhesive capsulitis given her history of uncontrolled diabetes.  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  Her primary goal is to get her sugar level under control and then we will readdress her pain and a possible referral to physical therapy later.  We also discussed future steroid injections in her shoulder, once her blood pressure and diabetes are under better control  Cold intolerance The patient states that she has had significant cold intolerance recently.  She notes that she has to keep her house above 76 degrees otherwise she is shivering. With this, she states that many people in her household complain with how high the temperature is.  The patient has also had ongoing fatigue.  She denies any skin changes, constipation, weight gain, or hair loss.  She is concerned that her iron level may be low would like this checked today.  Of note, she has had anemia in the past, so we will recheck this today.  Plan: -CBC and iron studies -TSH   Nedda Gains, D.O. [facility], PGY-2 Phone: [phone] Date 02/01/2022 Time 4:39 PM

## 2022-02-01 NOTE — Progress Notes (Signed)
Internal Medicine Clinic Attending ° °Case discussed with Dr. Atway  At the time of the visit.  We reviewed the resident’s history and exam and pertinent patient test results.  I agree with the assessment, diagnosis, and plan of care documented in the resident’s note.  °

## 2022-02-01 NOTE — Assessment & Plan Note (Signed)
The patient states that she has had significant cold intolerance recently.  She notes that she has to keep her house above 76 degrees otherwise she is shivering. With this, she states that many people in her household complain with how high the temperature is.  The patient has also had ongoing fatigue.  She denies any skin changes, constipation, weight gain, or hair loss.  She is concerned that her iron level may be low would like this checked today.  Of note, she has had anemia in the past, so we will recheck this today.  Plan: -CBC and iron studies -TSH

## 2022-02-01 NOTE — Addendum Note (Signed)
Addended by: Charise Killian on: 02/01/2022 10:31 PM   Modules accepted: Level of Service

## 2022-02-01 NOTE — Assessment & Plan Note (Addendum)
The patient presents to the [facility] for a 37-monthfollow-up of her diabetes.  At her last visit in August, her A1c was 10.7% and today it is 9.8%. The patient has been taking glargine 45 units daily.  She states that she is not taking her Trulicity as she messed up the pen needle and has not been able to use it since then.  She also states that she is not taking her FIran With just the insulin, her fasting sugars have been in the 60s on a couple of occasion and it has made her feel diaphoretic and shaky.   We discussed reducing her insulin today to avoid future episodes of hypoglycemia.  With her elevated A1c, we also discussed the importance of starting Trulicity and FIran  Patient is in agreement with the plan, and states that she just was unsure with what to take.  The patient has been overwhelmed, as she is the primary caregiver for her son who has many medical conditions, thus she states that her healthcare has been on the back burner.   Plan: - Urine micro today - Restart farxiga 10 mg daily - Reduce lantus to 25u daily  - Restart Trulicity .75 mg weekly, increase to 1.5 mg/week after  - Follow up in 2 weeks

## 2022-02-01 NOTE — Assessment & Plan Note (Addendum)
BP Readings from Last 3 Encounters:  02/01/22 (!) 202/108  10/17/21 (!) 139/93  10/07/21 (!) 178/100   Blood pressure today significantly elevated to 202/108 and upon recheck 195/106.  At the end of the patient's visit the blood pressure was checked 1 more time and was 189/107.  The patient is on Losartan-HCTZ 50-12.5 mg daily, however she believes that she was taking a different medicine for her blood pressure (one with potassium) and thus, I do not believe she was taking the correct medicine. She denies any headache, dizziness, lightheadedness, chest pain, dyspnea. The patient has not taken her medicines today and previously, it appears that she was taking the wrong one.  Plan: -Restart losartan-HCTZ 50-12.5 mg daily -Follow-up in 2 weeks for blood pressure recheck -If blood pressure remains above goal at that time, could start amlodipine

## 2022-02-01 NOTE — Patient Instructions (Signed)
Thank you, Adriana Burke for allowing [name] to provide your care today. Today we discussed:  Blood pressure: Take losartan-hydrochlorothiazide once a day for your blood pressure  Diabetes: Take 25 units of glargine each day (insulin) - this is a new dose! Please start taking trulicity 1.77 mg. This is a once a week injection Please start taking farxiga (pill) once a day  I have ordered the following labs for you:   Lab Orders         Glucose, capillary         Microalbumin / Creatinine Urine Ratio         CBC no Diff         TSH         Iron, TIBC and Ferritin Panel         POC Hbg A1C       Referrals ordered today:    Referral Orders         Referral to Nutrition and Diabetes Services      I have ordered the following medication/changed the following medications:   Stop the following medications: Medications Discontinued During This Encounter  Medication Reason   FARXIGA 10 MG TABS tablet Reorder   Insulin Glargine (BASAGLAR KWIKPEN) 100 UNIT/ML Reorder   losartan-hydrochlorothiazide (HYZAAR) 50-12.5 MG tablet Reorder   TRULICITY 9.39 QZ/0.0PQ SOPN Reorder   Insulin Pen Needle (PEN NEEDLES) 32G X 4 MM MISC Reorder     Start the following medications: Meds ordered this encounter  Medications   losartan-hydrochlorothiazide (HYZAAR) 50-12.5 MG tablet    Sig: Take 1 tablet by mouth daily.    Dispense:  90 tablet    Refill:  3   Insulin Glargine (BASAGLAR KWIKPEN) 100 UNIT/ML    Sig: Inject 25 Units into the skin daily.    Dispense:  15 mL    Refill:  1   FARXIGA 10 MG TABS tablet    Sig: Take 1 tablet (10 mg total) by mouth daily.    Dispense:  30 tablet    Refill:  3   Insulin Pen Needle (PEN NEEDLES) 32G X 4 MM MISC    Sig: Use daily as directed    Dispense:  100 each    Refill:  1   TRULICITY 3.30 QT/6.2UQ SOPN    Sig: Inject 0.75 mg into the skin once a week.    Dispense:  2 mL    Refill:  6     Follow up:  2 weeks     Should you have any questions  or concerns please call the internal medicine clinic at [phone].     Buddy Duty, D.O. [place]

## 2022-02-01 NOTE — Assessment & Plan Note (Signed)
The patient states that she has had right-sided shoulder pain over the last 6 months.  6 months ago, the patient fell while trying to put her grandchild in a highchair.  She has had pain since then, and notes that it has been progressively worsening.  Currently, she is unable to fully abduct her arm on the right secondary to pain.  I do not think she tore any tendons in her rotator cuff, as she has a negative drop arm test, negative empty can test, and negative liftoff test (although we could not do this test fully secondary to pain).  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  Her primary goal is to get her sugar level under control and then we will readdress her pain and a possible referral to physical therapy later.  We also discussed future steroid injections in her shoulder, once her blood pressure and diabetes are under better control

## 2022-02-02 LAB — IRON,TIBC AND FERRITIN PANEL
Ferritin: 44 ng/mL (ref 15–150)
Iron Saturation: 37 % (ref 15–55)
Iron: 89 ug/dL (ref 27–159)
Total Iron Binding Capacity: 239 ug/dL — ABNORMAL LOW (ref 250–450)
UIBC: 150 ug/dL (ref 131–425)

## 2022-02-02 LAB — MICROALBUMIN / CREATININE URINE RATIO
Creatinine, Urine: 57.6 mg/dL
Microalb/Creat Ratio: 7328 mg/g creat — ABNORMAL HIGH (ref 0–29)
Microalbumin, Urine: 4220.8 ug/mL

## 2022-02-02 LAB — CBC
Hematocrit: 40.8 % (ref 34.0–46.6)
Hemoglobin: 12.6 g/dL (ref 11.1–15.9)
MCH: 22.3 pg — ABNORMAL LOW (ref 26.6–33.0)
MCHC: 30.9 g/dL — ABNORMAL LOW (ref 31.5–35.7)
MCV: 72 fL — ABNORMAL LOW (ref 79–97)
Platelets: 236 10*3/uL (ref 150–450)
RBC: 5.65 x10E6/uL — ABNORMAL HIGH (ref 3.77–5.28)
RDW: 15.7 % — ABNORMAL HIGH (ref 11.7–15.4)
WBC: 4.8 10*3/uL (ref 3.4–10.8)

## 2022-02-02 LAB — TSH: TSH: 2.69 u[IU]/mL (ref 0.450–4.500)

## 2022-02-02 NOTE — Progress Notes (Signed)
Hb and iron sat within normal limits. TSH normal. Urine microalbumin/creatinine ratio is significantly elevated, putting patient in CKD A3 category. Patient is already on losartan and was advised to restart farxiga. I called her with these results and she has not yet started farxiga, but states she will today. Plan to follow up with [facility] in 2 weeks.

## 2022-02-03 ENCOUNTER — Other Ambulatory Visit (HOSPITAL_COMMUNITY): Payer: Self-pay

## 2022-02-13 ENCOUNTER — Encounter: Payer: Commercial Managed Care - HMO | Admitting: Internal Medicine

## 2022-02-13 ENCOUNTER — Telehealth: Payer: Self-pay | Admitting: *Deleted

## 2022-02-13 NOTE — Telephone Encounter (Signed)
Called and spoke with patient regarding her missed appointment/ patient to call and reschedule , the main number [phone].

## 2022-02-13 NOTE — Progress Notes (Deleted)
CC: 2 week f/u diabetes and htn  HPI:  [name] is a 51 y.o. female living with a history stated below and presents today for ***. Please see problem based assessment and plan for additional details.  Past Medical History:  Diagnosis Date   Asthma    Chronic rhinitis    Depression    Diabetes mellitus    Episodic recurrent vertigo    Fibromyalgia    History of hip fracture    hx childhood hip fracture   Hyperlipidemia    Obesity    Peripheral neuropathy    Rheumatoid arthritis(714.0)    seen by Rheumatology in [facility], Rheumatoid Factor pos, Anti CCP pos in 2005 -? early presentation of RF   Tinnitus    history of, Evaluated by ENT> [place]   Type II diabetes mellitus with manifestations, uncontrolled 01/08/2008   Annotation: likely T2DM high CBGs anf HbA1C of (7.5 on 01/2008) Qualifier: Diagnosis of  By: Cruzita Lederer MD, Annamarie Major D deficiency    history of    Current Outpatient Medications on File Prior to Visit  Medication Sig Dispense Refill   albuterol (PROVENTIL HFA;VENTOLIN HFA) 108 (90 Base) MCG/ACT inhaler Inhale 1-2 puffs into the lungs every 6 (six) hours as needed for wheezing or shortness of breath. 1 Inhaler 0   atorvastatin (LIPITOR) 80 MG tablet Take 1 tablet (80 mg total) by mouth daily. 90 tablet 3   DULoxetine (CYMBALTA) 30 MG capsule Take 2 capsules (60 mg total) by mouth daily. 180 capsule 0   FARXIGA 10 MG TABS tablet Take 1 tablet (10 mg total) by mouth daily. 30 tablet 3   glucose blood test strip Use to check blood sugar 2 (two) times daily. 100 each 12   Insulin Glargine (BASAGLAR KWIKPEN) 100 UNIT/ML Inject 25 Units into the skin daily. 15 mL 1   Insulin Pen Needle (PEN NEEDLES) 32G X 4 MM MISC Use daily as directed 100 each 1   losartan-hydrochlorothiazide (HYZAAR) 50-12.5 MG tablet Take 1 tablet by mouth daily. 90 tablet 3   naproxen (NAPROSYN) 500 MG tablet Take 1 tablet (500 mg total) by mouth 2 (two) times daily with a meal.  30 tablet 0   NON FORMULARY [facility]  Peripheral Neuropathy Cream- Bupivacaine 1%, Doxepin 3%, Gabapentin 6%, Pentoxifylline 3%, Topiramate 1% Apply 1-2 grams to affected area 3-4 times daily Qty. 120 gm 3 refills     polyethylene glycol powder (GLYCOLAX/MIRALAX) 17 GM/SCOOP powder DISSOLVE 17 GRAMS IN 8 OZ OF FLUID LIQUID DRINK DAILY AS DIRECTED 510 g 2   triamcinolone cream (KENALOG) 0.1 % Apply 1 application topically 2 (two) times daily. 80 g 0   TRULICITY 2.20 UR/4.2HC SOPN Inject 0.75 mg into the skin once a week. 2 mL 6   [DISCONTINUED] pregabalin (LYRICA) 100 MG capsule Take 1 capsule (100 mg total) by mouth 3 (three) times daily. 90 capsule 2   No current facility-administered medications on file prior to visit.    Family History  Problem Relation Age of Onset   Hypertension Mother    Diabetes Maternal Grandmother    Arthritis Maternal Grandmother    Kidney disease Maternal Grandmother     Social History   Socioeconomic History   Marital status: Legally Separated    Spouse name: Not on file   Number of children: Not on file   Years of education: 11   Highest education level: Not on file  Occupational History  Employer: [employer]  Tobacco Use   Smoking status: Never   Smokeless tobacco: Never  Vaping Use   Vaping Use: Never used  Substance and Sexual Activity   Alcohol use: Yes    Alcohol/week: 0.0 standard drinks of alcohol    Comment: occasionally   Drug use: No   Sexual activity: Not on file  Other Topics Concern   Not on file  Social History Narrative   [phone]   Social Determinants of Health   Financial Resource Strain: Not on file  Food Insecurity: No Food Insecurity (02/01/2022)   Hunger Vital Sign    Worried About Running Out of Food in the Last Year: Never true    Ran Out of Food in the Last Year: Never true  Transportation Needs: Not on file  Physical Activity: Not on file  Stress: Not on file  Social Connections:  Moderately Integrated (02/01/2022)   Social Connection and Isolation Panel [NHANES]    Frequency of Communication with Friends and Family: More than three times a week    Frequency of Social Gatherings with Friends and Family: More than three times a week    Attends Religious Services: 1 to 4 times per year    Active Member of [employer] or Organizations: Yes    Attends Archivist Meetings: Never    Marital Status: Separated  Intimate Partner Violence: Not At Risk (02/01/2022)   Humiliation, Afraid, Rape, and Kick questionnaire    Fear of Current or Ex-Partner: No    Emotionally Abused: No    Physically Abused: No    Sexually Abused: No    Review of Systems: ROS negative except for what is noted on the assessment and plan.  There were no vitals filed for this visit.  Physical Exam: Constitutional: well-appearing *** sitting in ***, in no acute distress HENT: normocephalic atraumatic, mucous membranes moist Eyes: conjunctiva non-erythematous Cardiovascular: regular rate and rhythm, no m/r/g Pulmonary/Chest: normal work of breathing on room air, lungs clear to auscultation bilaterally Abdominal: soft, non-tender, non-distended MSK: normal bulk and tone Neurological: alert & oriented x 3, no focal deficit Skin: warm and dry Psych: normal mood and behavior  Assessment & Plan:   DM: farxiga 10, lantus 25, trulicity 8.84 (restarted last visit)  HTN: losartan-hctz 50-12.5 (need to make arb today d//t urine micro)  Patient {GC/GE:3044014::"discussed with","seen with"} Dr. {ZYSAY:3016010::"XNATFTDD","U. Hoffman","Mullen","Narendra","Vincent","Guilloud","Lau","Machen"}  No problem-specific Assessment & Plan notes found for this encounter.   Buddy Duty, D.O. [facility], PGY-2 Phone: [phone] Date 02/13/2022 Time 8:00 AM

## 2022-02-14 ENCOUNTER — Emergency Department (HOSPITAL_BASED_OUTPATIENT_CLINIC_OR_DEPARTMENT_OTHER)
Admission: EM | Admit: 2022-02-14 | Discharge: 2022-02-14 | Disposition: A | Discharge status: Home / Self Care | Payer: Commercial Managed Care - HMO | Attending: Emergency Medicine | Admitting: Emergency Medicine

## 2022-02-14 ENCOUNTER — Emergency Department (HOSPITAL_BASED_OUTPATIENT_CLINIC_OR_DEPARTMENT_OTHER): Payer: Commercial Managed Care - HMO

## 2022-02-14 ENCOUNTER — Other Ambulatory Visit: Payer: Self-pay

## 2022-02-14 ENCOUNTER — Encounter (HOSPITAL_BASED_OUTPATIENT_CLINIC_OR_DEPARTMENT_OTHER): Payer: Self-pay | Admitting: Emergency Medicine

## 2022-02-14 DIAGNOSIS — Z7984 Long term (current) use of oral hypoglycemic drugs: Secondary | ICD-10-CM | POA: Diagnosis not present

## 2022-02-14 DIAGNOSIS — Z79899 Other long term (current) drug therapy: Secondary | ICD-10-CM | POA: Diagnosis not present

## 2022-02-14 DIAGNOSIS — J45909 Unspecified asthma, uncomplicated: Secondary | ICD-10-CM | POA: Diagnosis not present

## 2022-02-14 DIAGNOSIS — I10 Essential (primary) hypertension: Secondary | ICD-10-CM | POA: Insufficient documentation

## 2022-02-14 DIAGNOSIS — E1142 Type 2 diabetes mellitus with diabetic polyneuropathy: Secondary | ICD-10-CM | POA: Diagnosis not present

## 2022-02-14 DIAGNOSIS — Z794 Long term (current) use of insulin: Secondary | ICD-10-CM | POA: Insufficient documentation

## 2022-02-14 DIAGNOSIS — M79642 Pain in left hand: Secondary | ICD-10-CM | POA: Insufficient documentation

## 2022-02-14 DIAGNOSIS — W19XXXA Unspecified fall, initial encounter: Secondary | ICD-10-CM | POA: Diagnosis not present

## 2022-02-14 MED ORDER — HYDROCODONE-ACETAMINOPHEN 5-325 MG PO TABS: 1.0000 | ORAL_TABLET | Freq: Four times a day (QID) | ORAL | 0 refills | Status: AC | PRN

## 2022-02-14 MED ORDER — OXYCODONE-ACETAMINOPHEN 5-325 MG PO TABS
1.0000 | ORAL_TABLET | Freq: Once | ORAL | Status: AC
  Administered 2022-02-14: 1 via ORAL
  Filled 2022-02-14: qty 1

## 2022-02-14 NOTE — ED Triage Notes (Signed)
Pt arrives to ED with c/o left hand injury x3 days ago after a fall.

## 2022-02-14 NOTE — ED Provider Notes (Signed)
[facility] EMERGENCY DEPT Provider Note   CSN: [id number] Arrival date & time: 02/14/22  1600     History {Add pertinent medical, surgical, social history, OB history to HPI:1} Chief Complaint  Patient presents with   Hand Injury    Adriana Burke is a 51 y.o. female with Hx of depression, asthma, fibromyalgia, DMT2 with polyneuropathy, HTN, and rheumatoid arthritis presenting to the ED with chief complaint of left hand pain following a fall 3 days ago.  Fell on outstretched hand, then when getting up hit the back of her hand against a door frame.  Denies numbness, tingling, or weakness.  Endorses pain of the hand throughout, with focus over the back knuckles and her front wrist.  Still able to wiggle fingers.  Denies lightheadedness, dizziness, chest pain, or shortness of breath prior to or following the fall.  No other complaints at this time.  Hx of depression, asthma, fibromyalgia, DMT2 with polyneuropathy, HTN, and rheumatoid arthritis  The history is provided by the patient and medical records.  Hand Injury     Home Medications Prior to Admission medications   Medication Sig Start Date End Date Taking? Authorizing Provider  HYDROcodone-acetaminophen (NORCO/VICODIN) 5-325 MG tablet Take 1 tablet by mouth every 6 (six) hours as needed for severe pain. 02/14/22  Yes Prince Rome, PA-C  albuterol (PROVENTIL HFA;VENTOLIN HFA) 108 (90 Base) MCG/ACT inhaler Inhale 1-2 puffs into the lungs every 6 (six) hours as needed for wheezing or shortness of breath. 11/06/16   Lysbeth Penner, FNP  atorvastatin (LIPITOR) 80 MG tablet Take 1 tablet (80 mg total) by mouth daily. 10/17/21   Atway, Rayann N, DO  DULoxetine (CYMBALTA) 30 MG capsule Take 2 capsules (60 mg total) by mouth daily. 11/14/21 02/12/22  Atway, Rayann N, DO  FARXIGA 10 MG TABS tablet Take 1 tablet (10 mg total) by mouth daily. 02/01/22   Atway, Rayann N, DO  glucose blood test strip Use to check blood sugar 2  (two) times daily. 10/17/21   Atway, Rayann N, DO  Insulin Glargine (BASAGLAR KWIKPEN) 100 UNIT/ML Inject 25 Units into the skin daily. 02/01/22   Atway, Rayann N, DO  Insulin Pen Needle (PEN NEEDLES) 32G X 4 MM MISC Use daily as directed 02/01/22   Atway, Rayann N, DO  losartan-hydrochlorothiazide (HYZAAR) 50-12.5 MG tablet Take 1 tablet by mouth daily. 02/01/22   Atway, Rayann N, DO  naproxen (NAPROSYN) 500 MG tablet Take 1 tablet (500 mg total) by mouth 2 (two) times daily with a meal. 07/03/20   Jaynee Eagles, PA-C  NON [facility]  Peripheral Neuropathy Cream- Bupivacaine 1%, Doxepin 3%, Gabapentin 6%, Pentoxifylline 3%, Topiramate 1% Apply 1-2 grams to affected area 3-4 times daily Qty. 120 gm 3 refills    [provider]  polyethylene glycol powder (GLYCOLAX/MIRALAX) 17 GM/SCOOP powder DISSOLVE 17 GRAMS IN 8 OZ OF FLUID LIQUID DRINK DAILY AS DIRECTED 10/16/20   Volney American, PA-C  triamcinolone cream (KENALOG) 0.1 % Apply 1 application topically 2 (two) times daily. 10/15/20   Volney American, PA-C  TRULICITY 7.09 GG/8.3MO SOPN Inject 0.75 mg into the skin once a week. 02/01/22   Atway, Rayann N, DO  pregabalin (LYRICA) 100 MG capsule Take 1 capsule (100 mg total) by mouth 3 (three) times daily. 09/26/17 04/06/20  Edrick Kins, DPM      Allergies    Patient has no known allergies.    Review of Systems   Review of Systems  Musculoskeletal:        Left hand pain    Physical Exam Updated Vital Signs BP (!) 150/96   Pulse 95   Temp 98.3 F (36.8 C) (Oral)   Resp 18   Ht '5\' 3"'$  (1.6 m)   Wt 69.9 kg   SpO2 100%   BMI 27.28 kg/m  Physical Exam Vitals and nursing note reviewed.  Constitutional:      General: She is not in acute distress.    Appearance: She is well-developed. She is not ill-appearing, toxic-appearing or diaphoretic.  HENT:     Head: Normocephalic and atraumatic.  Eyes:     Conjunctiva/sclera: Conjunctivae normal.   Cardiovascular:     Rate and Rhythm: Normal rate and regular rhythm.     Heart sounds: No murmur heard. Pulmonary:     Effort: Pulmonary effort is normal. No respiratory distress.     Breath sounds: Normal breath sounds.  Abdominal:     Palpations: Abdomen is soft.     Tenderness: There is no abdominal tenderness.  Musculoskeletal:        General: No swelling.     Cervical back: Neck supple.     Comments: Mild tenderness of global hand, with minimal to mild swelling appreciated.  Radial pulses 2+.  No anatomical snuffbox tenderness.  CRT < 2.  Sensation appears grossly intact.  ROM and strength grossly intact 4/5 of hand and digits.  Diffuse bony tenderness with note over the left third metacarpal head and anterior wrist.  No ecchymosis, erythema, rash, or wound appreciated.  Skin:    General: Skin is warm and dry.     Capillary Refill: Capillary refill takes less than 2 seconds.  Neurological:     Mental Status: She is alert.  Psychiatric:        Mood and Affect: Mood normal.     ED Results / Procedures / Treatments   Labs (all labs ordered are listed, but only abnormal results are displayed) Labs Reviewed - No data to display  EKG None  Radiology DG Hand Complete Left  Result Date: 02/14/2022 CLINICAL DATA:  Trauma, pain EXAM: LEFT HAND - COMPLETE 3+ VIEW COMPARISON:  Left wrist done on 07/03/2020 and radiograph of both hands done on 06/15/2011 FINDINGS: No displaced fracture or dislocation is seen. There is possible break in the dorsal cortical margin of head of 1 of the metacarpals, possibly second and third metacarpal. This finding could not be seen in the rest of the images. There is soft tissue swelling over the dorsum. IMPRESSION: There is minimal cortical irregularity in the dorsal margin of the head of second or third metacarpal seen only in the lateral projection. This may be an artifact or recent or old undisplaced fracture. Please correlate with clinical physical  examination findings. Electronically Signed   By: Elmer Picker M.D.   On: 02/14/2022 16:42    Procedures Procedures  {Document cardiac monitor, telemetry assessment procedure when appropriate:1}  Medications Ordered in ED Medications  oxyCODONE-acetaminophen (PERCOCET/ROXICET) 5-325 MG per tablet 1 tablet (1 tablet Oral Given 02/14/22 1742)    ED Course/ Medical Decision Making/ A&P                           Medical Decision Making Amount and/or Complexity of Data Reviewed Radiology: ordered.   51 y.o. female presents to the ED for concern of Hand Injury   This involves an extensive number of treatment options, and  is a complaint that carries with it a high risk of complications and morbidity.  The emergent differential diagnosis prior to evaluation includes, but is not limited to: Fracture, dislocation, sprain, contusion  This is not an exhaustive differential.   Past Medical History / Co-morbidities / Social History: Hx of depression, asthma, fibromyalgia, DMT2 with polyneuropathy, HTN, and rheumatoid arthritis Social Determinants of Health include: Uncontrolled DMT2 and HTN, counseling was provided  Additional History:  None  Lab Tests: None  Imaging Studies: I ordered imaging studies including XR left hand.   I independently visualized and interpreted imaging which showed possible small nondisplaced fracture of third metacarpal head I agree with the radiologist interpretation.  ED Course: Pt well-appearing on exam.  ***.   Pt X-Ray with ***. Pain managed in ED.  Extremity appears neurovascularly intact.  No evidence of ischemia or compartment syndrome.  Pt advised to follow up with orthopedics for further evaluation and treatment within the next 2-4 days.  Patient given splint and sling while in ED.  After administration, extremity rechecked and remains neurovascularly intact.  Recommended RICE therapy and conservative symptom management until orthopedic follow  up.  Pt reports satisfaction with today's encounter.  Patient in NAD and in good condition at time of discharge.  Disposition: After consideration the patient's encounter today, I do not feel today's workup suggests an emergent condition requiring admission or immediate intervention beyond what has been performed at this time.  Safe for discharge; instructed to return immediately for worsening symptoms, change in symptoms or any other concerns.  I have reviewed the patients home medicines and have made adjustments as needed.  Discussed course of treatment with the patient, whom demonstrated understanding.  Patient in agreement and has no further questions.    This chart was dictated using voice recognition software.  Despite best efforts to proofread, errors can occur which can change the documentation meaning.   {Document critical care time when appropriate:1} {Document review of labs and clinical decision tools ie heart score, Chads2Vasc2 etc:1}  {Document your independent review of radiology images, and any outside records:1} {Document your discussion with family members, caretakers, and with consultants:1} {Document social determinants of health affecting pt's care:1} {Document your decision making why or why not admission, treatments were needed:1} Final Clinical Impression(s) / ED Diagnoses Final diagnoses:  Left hand pain    Rx / DC Orders ED Discharge Orders          Ordered    HYDROcodone-acetaminophen (NORCO/VICODIN) 5-325 MG tablet  Every 6 hours PRN        02/14/22 1721

## 2022-02-14 NOTE — Discharge Instructions (Addendum)
Your evaluated in the emergency department today for left hand pain following a fall/injury.  Imaging today revealed a possible fracture to one of the bones in your hand.  You been placed in a splint today, please keep this on until you are able to follow-up with orthopedic hand specialist Dr. Greta Doom.  His contact information has been provided for you, please call first thing in the morning to schedule your follow-up appointment.  Short course of pain medication has been sent to the pharmacy.  Please use as directed for the next 1 to 2 days.  Continue with Tylenol after this time period.  Return to the ED for new or worsening symptoms as discussed.

## 2022-02-14 NOTE — ED Notes (Signed)
Reviewed AVS/discharge instruction with patient. Time allotted for and all questions answered. Patient is agreeable for d/c and escorted to ed exit by staff.  

## 2022-02-18 ENCOUNTER — Other Ambulatory Visit: Payer: Self-pay | Admitting: Internal Medicine

## 2022-02-18 DIAGNOSIS — E119 Type 2 diabetes mellitus without complications: Secondary | ICD-10-CM

## 2022-03-02 ENCOUNTER — Encounter: Payer: Commercial Managed Care - HMO | Admitting: Student

## 2022-05-23 ENCOUNTER — Other Ambulatory Visit: Payer: Self-pay | Admitting: Internal Medicine

## 2022-05-23 DIAGNOSIS — Z1231 Encounter for screening mammogram for malignant neoplasm of breast: Secondary | ICD-10-CM

## 2022-06-08 ENCOUNTER — Ambulatory Visit: Admission: RE | Admit: 2022-06-08 | Payer: Self-pay | Source: Ambulatory Visit

## 2022-12-18 ENCOUNTER — Encounter: Payer: Self-pay | Admitting: Internal Medicine

## 2022-12-18 NOTE — Progress Notes (Deleted)
CC: ***  HPI:  Adriana Burke is a 52 y.o. female living with a history stated below and presents today for ***. Please see problem based assessment and plan for additional details.  Past Medical History:  Diagnosis Date   Asthma    Chronic rhinitis    Depression    Diabetes mellitus    Episodic recurrent vertigo    Fibromyalgia    History of hip fracture    hx childhood hip fracture   Hyperlipidemia    Obesity    Peripheral neuropathy    Rheumatoid arthritis(714.0)    seen by Rheumatology in [facility], Rheumatoid Factor pos, Anti CCP pos in 2005 -? early presentation of RF   Tinnitus    history of, Evaluated by ENT> [place]   Type II diabetes mellitus with manifestations, uncontrolled 01/08/2008   Annotation: likely T2DM high CBGs anf HbA1C of (7.5 on 01/2008) Qualifier: Diagnosis of  By: Elvera Lennox MD, Brantley Fling D deficiency    history of    Current Outpatient Medications on File Prior to Visit  Medication Sig Dispense Refill   albuterol (PROVENTIL HFA;VENTOLIN HFA) 108 (90 Base) MCG/ACT inhaler Inhale 1-2 puffs into the lungs every 6 (six) hours as needed for wheezing or shortness of breath. 1 Inhaler 0   atorvastatin (LIPITOR) 80 MG tablet Take 1 tablet (80 mg total) by mouth daily. 90 tablet 3   DULoxetine (CYMBALTA) 30 MG capsule Take 2 capsules (60 mg total) by mouth daily. 180 capsule 0   FARXIGA 10 MG TABS tablet Take 1 tablet (10 mg total) by mouth daily. 30 tablet 3   glucose blood test strip Use to check blood sugar 2 (two) times daily. 100 each 12   HYDROcodone-acetaminophen (NORCO/VICODIN) 5-325 MG tablet Take 1 tablet by mouth every 6 (six) hours as needed for severe pain. 10 tablet 0   Insulin Glargine (BASAGLAR KWIKPEN) 100 UNIT/ML INJECT 25 UNITS INTO THE SKIN DAILY. 3 mL 3   Insulin Pen Needle (PEN NEEDLES) 32G X 4 MM MISC Use daily as directed 100 each 1   losartan-hydrochlorothiazide (HYZAAR) 50-12.5 MG tablet Take 1 tablet by mouth daily.  90 tablet 3   naproxen (NAPROSYN) 500 MG tablet Take 1 tablet (500 mg total) by mouth 2 (two) times daily with a meal. 30 tablet 0   NON FORMULARY [facility]  Peripheral Neuropathy Cream- Bupivacaine 1%, Doxepin 3%, Gabapentin 6%, Pentoxifylline 3%, Topiramate 1% Apply 1-2 grams to affected area 3-4 times daily Qty. 120 gm 3 refills     polyethylene glycol powder (GLYCOLAX/MIRALAX) 17 GM/SCOOP powder DISSOLVE 17 GRAMS IN 8 OZ OF FLUID LIQUID DRINK DAILY AS DIRECTED 510 g 2   triamcinolone cream (KENALOG) 0.1 % Apply 1 application topically 2 (two) times daily. 80 g 0   TRULICITY 0.75 MG/0.5ML SOPN Inject 0.75 mg into the skin once a week. 2 mL 6   [DISCONTINUED] pregabalin (LYRICA) 100 MG capsule Take 1 capsule (100 mg total) by mouth 3 (three) times daily. 90 capsule 2   No current facility-administered medications on file prior to visit.    Family History  Problem Relation Age of Onset   Hypertension Mother    Diabetes Maternal Grandmother    Arthritis Maternal Grandmother    Kidney disease Maternal Grandmother     Social History   Socioeconomic History   Marital status: Legally Separated    Spouse name: Not on file   Number of children: Not on file  Years of education: 31   Highest education level: Not on file  Occupational History    Employer: [employer]  Tobacco Use   Smoking status: Never   Smokeless tobacco: Never  Vaping Use   Vaping status: Never Used  Substance and Sexual Activity   Alcohol use: Yes    Alcohol/week: 0.0 standard drinks of alcohol    Comment: occasionally   Drug use: No   Sexual activity: Not on file  Other Topics Concern   Not on file  Social History Narrative   [phone]   Social Determinants of Health   Financial Resource Strain: Not on file  Food Insecurity: No Food Insecurity (02/01/2022)   Hunger Vital Sign    Worried About Running Out of Food in the Last Year: Never true    Ran Out of Food in the Last Year:  Never true  Transportation Needs: Not on file  Physical Activity: Not on file  Stress: Not on file  Social Connections: Moderately Integrated (02/01/2022)   Social Connection and Isolation Panel [NHANES]    Frequency of Communication with Friends and Family: More than three times a week    Frequency of Social Gatherings with Friends and Family: More than three times a week    Attends Religious Services: 1 to 4 times per year    Active Member of [employer] or Organizations: Yes    Attends Banker Meetings: Never    Marital Status: Separated  Intimate Partner Violence: Not At Risk (02/01/2022)   Humiliation, Afraid, Rape, and Kick questionnaire    Fear of Current or Ex-Partner: No    Emotionally Abused: No    Physically Abused: No    Sexually Abused: No    Review of Systems: ROS negative except for what is noted on the assessment and plan.  There were no vitals filed for this visit.  Physical Exam: Constitutional: well-appearing *** sitting in ***, in no acute distress HENT: normocephalic atraumatic, mucous membranes moist Eyes: conjunctiva non-erythematous Cardiovascular: regular rate and rhythm, no m/r/g Pulmonary/Chest: normal work of breathing on room air, lungs clear to auscultation bilaterally Abdominal: soft, non-tender, non-distended MSK: normal bulk and tone Neurological: alert & oriented x 3, no focal deficit Skin: warm and dry Psych: normal mood and behavior  Assessment & Plan:   [facility] PCP??  HTN: - losartan-hydrochlorothiazide 50-12.5   DM: A1c November 9.8% > 12.8% 3 weeks ago  - farxiga 10 - lantus 25u - trulicity  Patient {GC/GE:[id number]::"discussed with","seen with"} Dr. {AVWUJ:[id number]::"WGNFAOZH","Y. Hoffman","Mullen","Narendra","Vincent","Guilloud","Lau","Machen"}  No problem-specific Assessment & Plan notes found for this encounter.   Adriana Burke, D.O. [facility] Internal Medicine, PGY-3 Phone: [phone] Date 12/18/2022 Time 7:40  AM

## 2023-04-11 ENCOUNTER — Encounter (HOSPITAL_COMMUNITY): Payer: Self-pay | Admitting: *Deleted

## 2023-04-11 ENCOUNTER — Emergency Department (HOSPITAL_COMMUNITY)
Admission: EM | Admit: 2023-04-11 | Discharge: 2023-04-12 | Disposition: A | Discharge status: Home / Self Care | Payer: No Typology Code available for payment source | Attending: Emergency Medicine | Admitting: Emergency Medicine

## 2023-04-11 ENCOUNTER — Other Ambulatory Visit: Payer: Self-pay

## 2023-04-11 DIAGNOSIS — Z794 Long term (current) use of insulin: Secondary | ICD-10-CM | POA: Diagnosis not present

## 2023-04-11 DIAGNOSIS — E1165 Type 2 diabetes mellitus with hyperglycemia: Secondary | ICD-10-CM | POA: Insufficient documentation

## 2023-04-11 DIAGNOSIS — N179 Acute kidney failure, unspecified: Secondary | ICD-10-CM | POA: Insufficient documentation

## 2023-04-11 DIAGNOSIS — Z20822 Contact with and (suspected) exposure to covid-19: Secondary | ICD-10-CM | POA: Diagnosis not present

## 2023-04-11 DIAGNOSIS — N3001 Acute cystitis with hematuria: Secondary | ICD-10-CM | POA: Insufficient documentation

## 2023-04-11 DIAGNOSIS — R112 Nausea with vomiting, unspecified: Secondary | ICD-10-CM | POA: Diagnosis present

## 2023-04-11 LAB — CBC
HCT: 35.6 % — ABNORMAL LOW (ref 36.0–46.0)
Hemoglobin: 11.2 g/dL — ABNORMAL LOW (ref 12.0–15.0)
MCH: 22.7 pg — ABNORMAL LOW (ref 26.0–34.0)
MCHC: 31.5 g/dL (ref 30.0–36.0)
MCV: 72.1 fL — ABNORMAL LOW (ref 80.0–100.0)
Platelets: 156 10*3/uL (ref 150–400)
RBC: 4.94 MIL/uL (ref 3.87–5.11)
RDW: 16.1 % — ABNORMAL HIGH (ref 11.5–15.5)
WBC: 10.4 10*3/uL (ref 4.0–10.5)
nRBC: 0 % (ref 0.0–0.2)

## 2023-04-11 LAB — COMPREHENSIVE METABOLIC PANEL
ALT: 17 U/L (ref 0–44)
AST: 19 U/L (ref 15–41)
Albumin: 2.3 g/dL — ABNORMAL LOW (ref 3.5–5.0)
Alkaline Phosphatase: 122 U/L (ref 38–126)
Anion gap: 12 (ref 5–15)
BUN: 22 mg/dL — ABNORMAL HIGH (ref 6–20)
CO2: 22 mmol/L (ref 22–32)
Calcium: 8.7 mg/dL — ABNORMAL LOW (ref 8.9–10.3)
Chloride: 103 mmol/L (ref 98–111)
Creatinine, Ser: 1.86 mg/dL — ABNORMAL HIGH (ref 0.44–1.00)
GFR, Estimated: 32 mL/min — ABNORMAL LOW (ref >60)
Glucose, Bld: 206 mg/dL — ABNORMAL HIGH (ref 70–99)
Potassium: 3.7 mmol/L (ref 3.5–5.1)
Sodium: 137 mmol/L (ref 135–145)
Total Bilirubin: 0.5 mg/dL (ref 0.0–1.2)
Total Protein: 5.8 g/dL — ABNORMAL LOW (ref 6.5–8.1)

## 2023-04-11 LAB — RESP PANEL BY RT-PCR (RSV, FLU A&B, COVID)  RVPGX2
Influenza A by PCR: NEGATIVE
Influenza B by PCR: NEGATIVE
Resp Syncytial Virus by PCR: NEGATIVE
SARS Coronavirus 2 by RT PCR: NEGATIVE

## 2023-04-11 LAB — LIPASE, BLOOD: Lipase: 20 U/L (ref 11–51)

## 2023-04-11 LAB — CBG MONITORING, ED: Glucose-Capillary: 193 mg/dL — ABNORMAL HIGH (ref 70–99)

## 2023-04-11 MED ORDER — ONDANSETRON 4 MG PO TBDP
4.0000 mg | ORAL_TABLET | Freq: Once | ORAL | Status: AC | PRN
  Administered 2023-04-11: 4 mg via ORAL
  Filled 2023-04-11: qty 1

## 2023-04-11 NOTE — ED Triage Notes (Signed)
 The pt is c/o generalized pain weakness nausea and vomiting and a sore throat since this am  she is a diabetic

## 2023-04-12 LAB — URINALYSIS, ROUTINE W REFLEX MICROSCOPIC
Bilirubin Urine: NEGATIVE
Glucose, UA: >=500 mg/dL — AB
Ketones, ur: NEGATIVE mg/dL
Nitrite: NEGATIVE
Protein, ur: >=300 mg/dL — AB
Specific Gravity, Urine: 1.011 (ref 1.005–1.030)
pH: 6 (ref 5.0–8.0)

## 2023-04-12 LAB — HCG, SERUM, QUALITATIVE: Preg, Serum: NEGATIVE

## 2023-04-12 LAB — CBG MONITORING, ED: Glucose-Capillary: 245 mg/dL — ABNORMAL HIGH (ref 70–99)

## 2023-04-12 MED ORDER — SODIUM CHLORIDE 0.9 % IV SOLN
1.0000 g | Freq: Once | INTRAVENOUS | Status: AC
  Administered 2023-04-12: 1 g via INTRAVENOUS
  Filled 2023-04-12: qty 10

## 2023-04-12 MED ORDER — CEPHALEXIN 500 MG PO CAPS: ORAL_CAPSULE | ORAL | 0 refills | Status: DC

## 2023-04-12 MED ORDER — ONDANSETRON HCL 4 MG PO TABS: 4.0000 mg | ORAL_TABLET | Freq: Three times a day (TID) | ORAL | 0 refills | Status: AC | PRN

## 2023-04-12 MED ORDER — SODIUM CHLORIDE 0.9 % IV BOLUS
1000.0000 mL | Freq: Once | INTRAVENOUS | Status: AC
  Administered 2023-04-12: 1000 mL via INTRAVENOUS

## 2023-04-12 MED ORDER — CEPHALEXIN 500 MG PO CAPS: ORAL_CAPSULE | ORAL | 0 refills | Status: AC

## 2023-04-12 MED ORDER — OXYCODONE-ACETAMINOPHEN 5-325 MG PO TABS
1.0000 | ORAL_TABLET | Freq: Once | ORAL | Status: AC
  Administered 2023-04-12: 1 via ORAL
  Filled 2023-04-12: qty 1

## 2023-04-12 MED ORDER — ACETAMINOPHEN 325 MG PO TABS
650.0000 mg | ORAL_TABLET | Freq: Four times a day (QID) | ORAL | Status: DC | PRN
  Administered 2023-04-12: 650 mg via ORAL
  Filled 2023-04-12: qty 2

## 2023-04-12 MED ORDER — ONDANSETRON 4 MG PO TBDP
4.0000 mg | ORAL_TABLET | Freq: Once | ORAL | Status: AC
  Administered 2023-04-12: 4 mg via ORAL
  Filled 2023-04-12: qty 1

## 2023-04-12 MED ORDER — FLUCONAZOLE 150 MG PO TABS: 150.0000 mg | ORAL_TABLET | Freq: Once | ORAL | 1 refills | Status: AC

## 2023-04-12 NOTE — ED Provider Notes (Signed)
 [place] EMERGENCY DEPARTMENT AT [facility] Provider Note   CSN: [id number] Arrival date & time: 04/11/23  2143     History  Chief Complaint  Patient presents with   Generalized Body Aches    Adriana Burke is a 53 y.o. female with past medical history of diabetes who presents emergency department with bodyaches chills nausea and vomiting x 2 days.  Patient denies flank pain.  She complains of generalized weakness.  Patient states that she has been vomiting and unable to hold anything down.  She denies any flank pain.  HPI     Home Medications Prior to Admission medications   Medication Sig Start Date End Date Taking? Authorizing Provider  albuterol  (PROVENTIL  HFA;VENTOLIN  HFA) 108 (90 Base) MCG/ACT inhaler Inhale 1-2 puffs into the lungs every 6 (six) hours as needed for wheezing or shortness of breath. 11/06/16   Pennie Elsie PARAS, FNP  atorvastatin  (LIPITOR) 80 MG tablet Take 1 tablet (80 mg total) by mouth daily. 10/17/21   Atway, Rayann N, DO  DULoxetine  (CYMBALTA ) 30 MG capsule Take 2 capsules (60 mg total) by mouth daily. 11/14/21 02/12/22  Atway, Rayann N, DO  FARXIGA  10 MG TABS tablet Take 1 tablet (10 mg total) by mouth daily. 02/01/22   Atway, Rayann N, DO  glucose blood test strip Use to check blood sugar 2 (two) times daily. 10/17/21   Atway, Rayann N, DO  HYDROcodone -acetaminophen  (NORCO/VICODIN) 5-325 MG tablet Take 1 tablet by mouth every 6 (six) hours as needed for severe pain. 02/14/22   Cockerham, Alicia M, PA-C  Insulin  Glargine (BASAGLAR  KWIKPEN) 100 UNIT/ML INJECT 25 UNITS INTO THE SKIN DAILY. 02/20/22   Atway, Rayann N, DO  Insulin  Pen Needle (PEN NEEDLES) 32G X 4 MM MISC Use daily as directed 02/01/22   Atway, Rayann N, DO  losartan -hydrochlorothiazide  (HYZAAR ) 50-12.5 MG tablet Take 1 tablet by mouth daily. 02/01/22   Atway, Rayann N, DO  naproxen  (NAPROSYN ) 500 MG tablet Take 1 tablet (500 mg total) by mouth 2 (two) times daily with a meal. 07/03/20    Christopher Savannah, PA-C  NON [facility]  Peripheral Neuropathy Cream- Bupivacaine 1%, Doxepin 3%, Gabapentin  6%, Pentoxifylline 3%, Topiramate 1% Apply 1-2 grams to affected area 3-4 times daily Qty. 120 gm 3 refills    [provider]  polyethylene glycol powder (GLYCOLAX /MIRALAX ) 17 GM/SCOOP powder DISSOLVE 17 GRAMS IN 8 OZ OF FLUID LIQUID DRINK DAILY AS DIRECTED 10/16/20   Stuart Vernell Norris, PA-C  triamcinolone  cream (KENALOG ) 0.1 % Apply 1 application topically 2 (two) times daily. 10/15/20   Stuart Vernell Norris, PA-C  TRULICITY  0.75 MG/0.5ML SOPN Inject 0.75 mg into the skin once a week. 02/01/22   Atway, Rayann N, DO  pregabalin  (LYRICA ) 100 MG capsule Take 1 capsule (100 mg total) by mouth 3 (three) times daily. 09/26/17 04/06/20  Janit Thresa HERO, DPM      Allergies    Patient has no known allergies.    Review of Systems   Review of Systems  Physical Exam Updated Vital Signs BP (!) 171/95 (BP Location: Right Arm)   Pulse (!) 103   Temp 100.3 F (37.9 C) (Oral)   Resp 16   Ht 5' 3 (1.6 m)   Wt 69.9 kg   SpO2 100%   BMI 27.30 kg/m  Physical Exam Vitals and nursing note reviewed.  Constitutional:      General: She is not in acute distress.    Appearance: She is well-developed.  She is ill-appearing. She is not toxic-appearing or diaphoretic.  HENT:     Head: Normocephalic and atraumatic.     Right Ear: External ear normal.     Left Ear: External ear normal.     Nose: Nose normal.     Mouth/Throat:     Mouth: Mucous membranes are moist.  Eyes:     General: No scleral icterus.    Conjunctiva/sclera: Conjunctivae normal.  Cardiovascular:     Rate and Rhythm: Normal rate and regular rhythm.     Heart sounds: Normal heart sounds. No murmur heard.    No friction rub. No gallop.  Pulmonary:     Effort: Pulmonary effort is normal. No respiratory distress.     Breath sounds: Normal breath sounds.  Abdominal:     General: Bowel sounds are normal.  There is no distension.     Palpations: Abdomen is soft. There is no mass.     Tenderness: There is no abdominal tenderness. There is no right CVA tenderness, left CVA tenderness or guarding.  Musculoskeletal:     Cervical back: Normal range of motion.  Skin:    General: Skin is warm and dry.  Neurological:     Mental Status: She is alert and oriented to person, place, and time.  Psychiatric:        Behavior: Behavior normal.     ED Results / Procedures / Treatments   Labs (all labs ordered are listed, but only abnormal results are displayed) Labs Reviewed  COMPREHENSIVE METABOLIC PANEL - Abnormal; Notable for the following components:      Result Value   Glucose, Bld 206 (*)    BUN 22 (*)    Creatinine, Ser 1.86 (*)    Calcium  8.7 (*)    Total Protein 5.8 (*)    Albumin 2.3 (*)    GFR, Estimated 32 (*)    All other components within normal limits  CBC - Abnormal; Notable for the following components:   Hemoglobin 11.2 (*)    HCT 35.6 (*)    MCV 72.1 (*)    MCH 22.7 (*)    RDW 16.1 (*)    All other components within normal limits  URINALYSIS, ROUTINE W REFLEX MICROSCOPIC - Abnormal; Notable for the following components:   APPearance HAZY (*)    Glucose, UA >=500 (*)    Hgb urine dipstick SMALL (*)    Protein, ur >=300 (*)    Leukocytes,Ua MODERATE (*)    Bacteria, UA RARE (*)    All other components within normal limits  CBG MONITORING, ED - Abnormal; Notable for the following components:   Glucose-Capillary 193 (*)    All other components within normal limits  CBG MONITORING, ED - Abnormal; Notable for the following components:   Glucose-Capillary 245 (*)    All other components within normal limits  RESP PANEL BY RT-PCR (RSV, FLU A&B, COVID)  [id number]  URINE CULTURE  LIPASE, BLOOD  HCG, SERUM, QUALITATIVE    EKG EKG Interpretation Date/Time:  Wednesday April 11 2023 22:26:21 EST Ventricular Rate:  105 PR Interval:  140 QRS Duration:  84 QT  Interval:  310 QTC Calculation: 409 R Axis:   96  Text Interpretation: Sinus tachycardia Rightward axis T wave abnormality, consider inferior ischemia Abnormal ECG When compared with ECG of 17-Jun-2016 16:59, No significant change was found Confirmed by Raford Lenis ([id number]) on 04/12/2023 2:47:03 AM  Radiology No results found.  Procedures Procedures    Medications Ordered in  ED Medications  sodium chloride  0.9 % bolus 1,000 mL (has no administration in time range)  cefTRIAXone  (ROCEPHIN ) 1 g in sodium chloride  0.9 % 100 mL IVPB (has no administration in time range)  ondansetron  (ZOFRAN -ODT) disintegrating tablet 4 mg (4 mg Oral Given 04/11/23 2220)  oxyCODONE -acetaminophen  (PERCOCET/ROXICET) 5-325 MG per tablet 1 tablet (1 tablet Oral Given 04/12/23 0309)  ondansetron  (ZOFRAN -ODT) disintegrating tablet 4 mg (4 mg Oral Given 04/12/23 0310)    ED Course/ Medical Decision Making/ A&P Clinical Course as of 04/12/23 0537  Thu Apr 12, 2023  0535 Creatinine(!): 1.86 [AH]  0535 GFR, Estimated(!): 32 [AH]  0535 Urinalysis, Routine w reflex microscopic -Urine, Clean Catch(!) [AH]    Clinical Course User Index [AH] Arloa Chroman, PA-C                                 Medical Decision Making Amount and/or Complexity of Data Reviewed Labs: ordered. Decision-making details documented in ED Course.  Risk Prescription drug management. Decision regarding hospitalization.    This patient presents to the ED for concern of nausea vomiting and bodyaches., this involves an extensive number of treatment options, and is a complaint that carries with it a high risk of complications and morbidity.  The emergent differential diagnosis for vomiting includes, but is not limited to ACS/MI, DKA, Ischemic bowel, Meningitis, Sepsis, Acute gastric dilation, Adrenal insufficiency, Appendicitis,  Bowel obstruction/ileus, Carbon monoxide poisoning, Cholecystitis, Electrolyte abnormalities, Elevated ICP, Gastric  outlet obstruction, Pancreatitis, Ruptured viscus, Biliary colic, Cannabinoid hyperemesis syndrome, Gastritis, Gastroenteritis, Gastroparesis,  Narcotic withdrawal, Peptic ulcer disease, and UTI   Co morbidities:  history of diabetes RA  Social Determinants of Health:   SDOH Screenings   Food Insecurity: No Food Insecurity (02/01/2022)  Housing: Low Risk  (02/01/2022)  Utilities: Not At Risk (02/01/2022)  Depression (PHQ2-9): Medium Risk (02/01/2022)  Social Connections: Moderately Integrated (02/01/2022)  Tobacco Use: Low Risk  (04/11/2023)     Additional history:  {Additional history obtained from triage summary {External records from outside source obtained and reviewed including outside labs  (last Cr 1.91)  Lab Tests:  I Ordered, and personally interpreted labs.  The pertinent results include:    I reviewed the patient's labs.  She appears have a urinary tract infection.  CMP shows slightly elevated creatinine, glucose of 206, low protein level all otherwise insignificant.  Creatinine 1.86 and outside records shows last was 1.91.  This appears to be chronic. CBC without elevated white blood cell count.  Negative for respiratory infection on respiratory panel    Medicines ordered and prescription drug management:  I ordered medication including  Medications  sodium chloride  0.9 % bolus 1,000 mL (has no administration in time range)  cefTRIAXone  (ROCEPHIN ) 1 g in sodium chloride  0.9 % 100 mL IVPB (has no administration in time range)  ondansetron  (ZOFRAN -ODT) disintegrating tablet 4 mg (4 mg Oral Given 04/11/23 2220)  oxyCODONE -acetaminophen  (PERCOCET/ROXICET) 5-325 MG per tablet 1 tablet (1 tablet Oral Given 04/12/23 0309)  ondansetron  (ZOFRAN -ODT) disintegrating tablet 4 mg (4 mg Oral Given 04/12/23 0310)   for dehydration nausea vomiting generalized pain and urinary tract infection Reevaluation of the patient after these medicines showed that the patient improved I have  reviewed the patients home medicines and have made adjustments as needed  Test Considered:   considered a CT imaging but she has no CVA tenderness.  Critical Interventions:   fluids, antibiotics  Consultations Obtained:  Problem List / ED Course:     ICD-10-CM   1. Acute cystitis with hematuria  N30.01     2. AKI (acute kidney injury) (HCC)  N17.9       MDM: Patient here with nausea vomiting, chronic renal insufficiency, immunocompromise due to diabetes.  She appears have a urinary tract infection.  Patient feels significantly improved after fluid resuscitation pain medications.  She states I just feels a little bit weak now.  Patient offered but declined admission at this time.  Patient can be safely discharged home on antibiotics.  Urine culture sent.  Strict return precautions.   Dispostion:  After consideration of the diagnostic results and the patients response to treatment, I feel that the patent would benefit from discharge.         Final Clinical Impression(s) / ED Diagnoses Final diagnoses:  Acute cystitis with hematuria  AKI (acute kidney injury) [facility])    Rx / DC Orders ED Discharge Orders     None         Arloa Chroman, PA-C 04/12/23 0616    Raford Lenis, MD 04/12/23 [phone]

## 2023-04-12 NOTE — Discharge Instructions (Addendum)
 Contact a health care provider if:  Your symptoms don't get better after 1-2 days of taking antibiotics.  Your symptoms go away and then come back.  You have a fever or chills.  You vomit or feel like you may vomit.    Get help right away if:  You have very bad pain in your back or lower belly.  You faint.    This information is not intended to replace advice given to you by your health care provider. Make sure you discuss any questions you have with your health care provider.

## 2023-04-13 LAB — URINE CULTURE: Culture: >=100000 — AB

## 2023-04-14 ENCOUNTER — Telehealth (HOSPITAL_BASED_OUTPATIENT_CLINIC_OR_DEPARTMENT_OTHER): Payer: Self-pay | Admitting: *Deleted

## 2023-04-14 NOTE — Telephone Encounter (Signed)
 Post ED Visit - Positive Culture Follow-up  Culture report reviewed by antimicrobial stewardship pharmacist: [facility] []  Rankin Dee, Pharm.D. []  Venetia Gully, Pharm.D., BCPS AQ-ID []  Garrel Crews, Pharm.D., BCPS []  Almarie Lunger, Pharm.D., BCPS []  Slaughter, [place].D., BCPS, AAHIVP []  Rosaline Bihari, Pharm.D., BCPS, AAHIVP []  Vernell Meier, PharmD, BCPS []  Latanya Hint, PharmD, BCPS []  Donald Medley, PharmD, BCPS []  Rocky Bold, PharmD []  Dorothyann Alert, PharmD, BCPS [x]  Rankin Sams, PharmD  [facility] []  Rosaline Edison, PharmD []  Romona Bliss, PharmD []  Dolphus Roller, PharmD []  Veva Seip, Rph []  Vernell Daunt) Leonce, PharmD []  Eva Allis, PharmD []  Rosaline Millet, PharmD []  Iantha Batch, PharmD []  Arvin Gauss, PharmD []  Wanda Hasting, PharmD []  Ronal Rav, PharmD []  Rocky Slade, PharmD []  Bard Jeans, PharmD   Positive urine culture Treated with Cephalexin , organism sensitive to the same and no further patient follow-up is required at this time.  Adriana Burke 04/14/2023, 10:29 AM

## 2023-04-17 ENCOUNTER — Ambulatory Visit (HOSPITAL_COMMUNITY)
Admission: EM | Admit: 2023-04-17 | Discharge: 2023-04-17 | Disposition: A | Discharge status: Home / Self Care | Payer: No Typology Code available for payment source | Attending: Internal Medicine | Admitting: Internal Medicine

## 2023-04-17 ENCOUNTER — Encounter (HOSPITAL_COMMUNITY): Payer: Self-pay | Admitting: Internal Medicine

## 2023-04-17 DIAGNOSIS — M545 Low back pain, unspecified: Secondary | ICD-10-CM

## 2023-04-17 LAB — POCT URINALYSIS DIP (MANUAL ENTRY)
Bilirubin, UA: NEGATIVE
Glucose, UA: 500 mg/dL — AB
Ketones, POC UA: NEGATIVE mg/dL
Leukocytes, UA: NEGATIVE
Nitrite, UA: NEGATIVE
Protein Ur, POC: >=300 mg/dL — AB
Spec Grav, UA: 1.02 (ref 1.010–1.025)
Urobilinogen, UA: 0.2 U/dL
pH, UA: 6.5 (ref 5.0–8.0)

## 2023-04-17 MED ORDER — LIDOCAINE 5 % EX PTCH: 1.0000 | MEDICATED_PATCH | CUTANEOUS | 0 refills | Status: AC

## 2023-04-17 NOTE — ED Triage Notes (Signed)
Pt states seen and tx'd for UTI at [facility] last Friday. States her lt flank pain is worse and radiating to RLQ. States took tylenol with no relief.

## 2023-04-17 NOTE — ED Provider Notes (Signed)
[facility]    CSN: [id number] Arrival date & time: 04/17/23  1814      History   Chief Complaint Chief Complaint  Patient presents with   Flank Pain    HPI Adriana Burke is a 53 y.o. female who presents due to having L flank pain, that has felt worse to her. She was seen in ER last week and denies flank pain then,but pt says she was out of it and does not recall. Her pain is provoked with movement. Has not had a  fever. Has been taking her Keflex, but did forget to take one of her BP meds. Has hx of AKI and her nephrology appointment was rescheduled twice since December. Now wont be til April.     Past Medical History:  Diagnosis Date   Asthma    Chronic rhinitis    Depression    Diabetes mellitus    Episodic recurrent vertigo    Fibromyalgia    History of hip fracture    hx childhood hip fracture   Hyperlipidemia    Obesity    Peripheral neuropathy    Rheumatoid arthritis(714.0)    seen by Rheumatology in [facility], Rheumatoid Factor pos, Anti CCP pos in 2005 -? early presentation of RF   Tinnitus    history of, Evaluated by ENT> [place]   Type II diabetes mellitus with manifestations, uncontrolled 01/08/2008   Annotation: likely T2DM high CBGs anf HbA1C of (7.5 on 01/2008) Qualifier: Diagnosis of  By: Elvera Lennox MD, Brantley Fling D deficiency    history of    Patient Active Problem List   Diagnosis Date Noted   Tendinopathy of right rotator cuff 02/01/2022   Cold intolerance 02/01/2022   Healthcare maintenance 10/17/2021   Hyperlipidemia associated with type 2 diabetes mellitus (HCC) 12/17/2020   Diabetes mellitus type 2, insulin dependent (HCC) 12/17/2020   Hypertension associated with type 2 diabetes mellitus (HCC) 12/17/2020   Acute cystitis without hematuria 09/16/2014   Fibrocystic changes of right breast 12/03/2013   Bilateral hip pain 10/14/2013   Alopecia areata 09/16/2013   Impaired hearing rt ear 01/02/2013   Abnormality of gait  06/12/2012   Osteoarthritis of left hip 05/31/2011   Chronic generalized pain 04/01/2011   Diabetic polyneuropathy (HCC) 02/24/2010   FIBROMYALGIA 10/29/2009   WRIST PAIN, BILATERAL 11/16/2008   Vitamin D deficiency 07/24/2008   RHEUMATOID ARTHRITIS 04/17/2008   KNEE PAIN, BILATERAL 08/23/2006   DEPRESSION 04/10/2006   ASTHMA 01/08/2006    Past Surgical History:  Procedure Laterality Date   CESAREAN SECTION      OB History     Gravida  6   Para  6   Term  6   Preterm      AB      Living  6      SAB      IAB      Ectopic      Multiple  1   Live Births               Home Medications    Prior to Admission medications   Medication Sig Start Date End Date Taking? Authorizing Provider  lidocaine (LIDODERM) 5 % Place 1 patch onto the skin daily. Remove & Discard patch within 12 hours or as directed by MD 04/17/23  Yes Rodriguez-Southworth, Nettie Elm, PA-C  albuterol (PROVENTIL HFA;VENTOLIN HFA) 108 (90 Base) MCG/ACT inhaler Inhale 1-2 puffs into the lungs every 6 (six) hours as  needed for wheezing or shortness of breath. 11/06/16   Deatra Canter, FNP  atorvastatin (LIPITOR) 80 MG tablet Take 1 tablet (80 mg total) by mouth daily. 10/17/21   Atway, Derwood Kaplan, DO  cephALEXin (KEFLEX) 500 MG capsule 2 caps po bid x 7 days 04/12/23   Arthor Captain, PA-C  DULoxetine (CYMBALTA) 30 MG capsule Take 2 capsules (60 mg total) by mouth daily. 11/14/21 02/12/22  Atway, Rayann N, DO  FARXIGA 10 MG TABS tablet Take 1 tablet (10 mg total) by mouth daily. 02/01/22   Atway, Rayann N, DO  glucose blood test strip Use to check blood sugar 2 (two) times daily. 10/17/21   Atway, Rayann N, DO  HYDROcodone-acetaminophen (NORCO/VICODIN) 5-325 MG tablet Take 1 tablet by mouth every 6 (six) hours as needed for severe pain. 02/14/22   Cecil Cobbs, PA-C  Insulin Glargine (BASAGLAR KWIKPEN) 100 UNIT/ML INJECT 25 UNITS INTO THE SKIN DAILY. 02/20/22   Atway, Rayann N, DO  Insulin Pen Needle  (PEN NEEDLES) 32G X 4 MM MISC Use daily as directed 02/01/22   Atway, Rayann N, DO  losartan-hydrochlorothiazide (HYZAAR) 50-12.5 MG tablet Take 1 tablet by mouth daily. 02/01/22   Chauncey Mann, DO  NON FORMULARY [facility]  Peripheral Neuropathy Cream- Bupivacaine 1%, Doxepin 3%, Gabapentin 6%, Pentoxifylline 3%, Topiramate 1% Apply 1-2 grams to affected area 3-4 times daily Qty. 120 gm 3 refills    [provider]  ondansetron (ZOFRAN) 4 MG tablet Take 1 tablet (4 mg total) by mouth every 8 (eight) hours as needed for nausea or vomiting. 04/12/23   Arthor Captain, PA-C  polyethylene glycol powder (GLYCOLAX/MIRALAX) 17 GM/SCOOP powder DISSOLVE 17 GRAMS IN 8 OZ OF FLUID LIQUID DRINK DAILY AS DIRECTED 10/16/20   Particia Nearing, PA-C  triamcinolone cream (KENALOG) 0.1 % Apply 1 application topically 2 (two) times daily. 10/15/20   Particia Nearing, PA-C  TRULICITY 0.75 MG/0.5ML SOPN Inject 0.75 mg into the skin once a week. 02/01/22   Atway, Rayann N, DO  pregabalin (LYRICA) 100 MG capsule Take 1 capsule (100 mg total) by mouth 3 (three) times daily. 09/26/17 04/06/20  Felecia Shelling, DPM    Family History Family History  Problem Relation Age of Onset   Hypertension Mother    Diabetes Maternal Grandmother    Arthritis Maternal Grandmother    Kidney disease Maternal Grandmother     Social History Social History   Tobacco Use   Smoking status: Never   Smokeless tobacco: Never  Vaping Use   Vaping status: Never Used  Substance Use Topics   Alcohol use: Yes    Alcohol/week: 0.0 standard drinks of alcohol    Comment: occasionally   Drug use: No     Allergies   Patient has no known allergies.   Review of Systems Review of Systems As noted In HPI  Physical Exam Triage Vital Signs ED Triage Vitals  Encounter Vitals Group     BP      Systolic BP Percentile      Diastolic BP Percentile      Pulse      Resp      Temp      Temp src      SpO2       Weight      Height      Head Circumference      Peak Flow      Pain Score      Pain Loc  Pain Education      Exclude from Growth Chart    No data found.  Updated Vital Signs BP (!) 214/104 (BP Location: Right Arm)   Pulse 87   Temp 98 F (36.7 C) (Oral)   Resp 20   SpO2 98%   Visual Acuity Right Eye Distance:   Left Eye Distance:   Bilateral Distance:    Right Eye Near:   Left Eye Near:    Bilateral Near:     Physical Exam Physical Exam Vitals and nursing note reviewed.  Constitutional:      General: She is not in acute distress.    Appearance: She is not toxic-appearing.  HENT:     Head: Normocephalic.     Right Ear: External ear normal.     Left Ear: External ear normal.  Eyes:     General: No scleral icterus.    Conjunctiva/sclera: Conjunctivae normal.  Pulmonary:     Effort: Pulmonary effort is normal.  Abdominal:     General: Bowel sounds are normal.     Palpations: Abdomen is soft. There is no mass.     Tenderness: There is no guarding or rebound.     Comments: +- CVA tenderness   Musculoskeletal:        General: Normal range of motion.     Cervical back: Neck supple.     Comments: BACK- has  muscular tenderness on area of complaint with palpation and spine ROM Skin:    General: Skin is warm and dry.     Findings: No rash.  Neurological:     Mental Status: She is alert and oriented to person, place, and time.     Gait: Gait normal.  Psychiatric:        Mood and Affect: Mood normal.        Behavior: Behavior normal.        Thought Content: Thought content normal.        Judgment: Judgment normal.    UC Treatments / Results  Labs (all labs ordered are listed, but only abnormal results are displayed) Labs Reviewed  POCT URINALYSIS DIP (MANUAL ENTRY) - Abnormal; Notable for the following components:      Result Value   Color, UA light yellow (*)    Clarity, UA hazy (*)    Glucose, UA =500 (*)    Blood, UA moderate (*)    Protein Ur,  POC >=300 (*)    All other components within normal limits    EKG   Radiology No results found.  Procedures Procedures (including critical care time)  Medications Ordered in UC Medications - No data to display  Initial Impression / Assessment and Plan / UC Course  I have reviewed the triage vital signs and the nursing notes.  Pertinent labs  results that were available during my care of the patient were reviewed by me and considered in my medical decision making (see chart for details).  UTI improving  L lumbar back pain  I reviewed her urine culture which shows Strep group A and Keflex will cover this I placed her on Lidoderm for pain. See instructions.  Advised to take her BP med as soon as she gets to her car, since it is there.   Final Clinical Impressions(s) / UC Diagnoses   Final diagnoses:  Pain in left lumbar region of back     Discharge Instructions      You may take Tylenol as directed in the box as  needed for pain Due to you poor kidney function, avoid Ibuprofen, Naprosyn, Aleeve and Advil.     ED Prescriptions     Medication Sig Dispense Auth. Provider   lidocaine (LIDODERM) 5 % Place 1 patch onto the skin daily. Remove & Discard patch within 12 hours or as directed by MD 30 patch Rodriguez-Southworth, Nettie Elm, PA-C      PDMP not reviewed this encounter.   Garey Ham, PA-C 04/17/23 2020

## 2023-04-17 NOTE — Discharge Instructions (Signed)
You may take Tylenol as directed in the box as needed for pain Due to you poor kidney function, avoid Ibuprofen, Naprosyn, Aleeve and Advil.

## 2023-04-21 ENCOUNTER — Emergency Department (HOSPITAL_COMMUNITY): Payer: No Typology Code available for payment source

## 2023-04-21 ENCOUNTER — Emergency Department (HOSPITAL_COMMUNITY)
Admission: EM | Admit: 2023-04-21 | Discharge: 2023-04-21 | Disposition: A | Discharge status: Home / Self Care | Payer: No Typology Code available for payment source | Attending: Emergency Medicine | Admitting: Emergency Medicine

## 2023-04-21 ENCOUNTER — Encounter (HOSPITAL_COMMUNITY): Payer: Self-pay

## 2023-04-21 ENCOUNTER — Other Ambulatory Visit: Payer: Self-pay

## 2023-04-21 DIAGNOSIS — R109 Unspecified abdominal pain: Secondary | ICD-10-CM | POA: Diagnosis present

## 2023-04-21 DIAGNOSIS — N179 Acute kidney failure, unspecified: Secondary | ICD-10-CM | POA: Diagnosis not present

## 2023-04-21 DIAGNOSIS — I10 Essential (primary) hypertension: Secondary | ICD-10-CM | POA: Diagnosis not present

## 2023-04-21 DIAGNOSIS — Z79899 Other long term (current) drug therapy: Secondary | ICD-10-CM | POA: Insufficient documentation

## 2023-04-21 DIAGNOSIS — Z794 Long term (current) use of insulin: Secondary | ICD-10-CM | POA: Diagnosis not present

## 2023-04-21 LAB — URINALYSIS, ROUTINE W REFLEX MICROSCOPIC
Bilirubin Urine: NEGATIVE
Glucose, UA: >=500 mg/dL — AB
Hgb urine dipstick: NEGATIVE
Ketones, ur: NEGATIVE mg/dL
Leukocytes,Ua: NEGATIVE
Nitrite: NEGATIVE
Protein, ur: >=300 mg/dL — AB
Specific Gravity, Urine: 1.012 (ref 1.005–1.030)
pH: 6 (ref 5.0–8.0)

## 2023-04-21 LAB — COMPREHENSIVE METABOLIC PANEL
ALT: 19 U/L (ref 0–44)
AST: 15 U/L (ref 15–41)
Albumin: 2.1 g/dL — ABNORMAL LOW (ref 3.5–5.0)
Alkaline Phosphatase: 113 U/L (ref 38–126)
Anion gap: 10 (ref 5–15)
BUN: 20 mg/dL (ref 6–20)
CO2: 23 mmol/L (ref 22–32)
Calcium: 8.7 mg/dL — ABNORMAL LOW (ref 8.9–10.3)
Chloride: 101 mmol/L (ref 98–111)
Creatinine, Ser: 1.91 mg/dL — ABNORMAL HIGH (ref 0.44–1.00)
GFR, Estimated: 31 mL/min — ABNORMAL LOW (ref >60)
Glucose, Bld: 352 mg/dL — ABNORMAL HIGH (ref 70–99)
Potassium: 4.1 mmol/L (ref 3.5–5.1)
Sodium: 134 mmol/L — ABNORMAL LOW (ref 135–145)
Total Bilirubin: 0.4 mg/dL (ref 0.0–1.2)
Total Protein: 6.1 g/dL — ABNORMAL LOW (ref 6.5–8.1)

## 2023-04-21 LAB — CBC
HCT: 35.3 % — ABNORMAL LOW (ref 36.0–46.0)
Hemoglobin: 11 g/dL — ABNORMAL LOW (ref 12.0–15.0)
MCH: 22.4 pg — ABNORMAL LOW (ref 26.0–34.0)
MCHC: 31.2 g/dL (ref 30.0–36.0)
MCV: 71.7 fL — ABNORMAL LOW (ref 80.0–100.0)
Platelets: 271 10*3/uL (ref 150–400)
RBC: 4.92 MIL/uL (ref 3.87–5.11)
RDW: 15.6 % — ABNORMAL HIGH (ref 11.5–15.5)
WBC: 9.8 10*3/uL (ref 4.0–10.5)
nRBC: 0 % (ref 0.0–0.2)

## 2023-04-21 LAB — HCG, SERUM, QUALITATIVE: Preg, Serum: NEGATIVE

## 2023-04-21 LAB — LIPASE, BLOOD: Lipase: 23 U/L (ref 11–51)

## 2023-04-21 LAB — I-STAT CG4 LACTIC ACID, ED: Lactic Acid, Venous: 0.5 mmol/L (ref 0.5–1.9)

## 2023-04-21 MED ORDER — SODIUM CHLORIDE 0.9 % IV BOLUS
1000.0000 mL | Freq: Once | INTRAVENOUS | Status: AC
  Administered 2023-04-21: 1000 mL via INTRAVENOUS

## 2023-04-21 MED ORDER — ONDANSETRON HCL 4 MG/2ML IJ SOLN
4.0000 mg | Freq: Once | INTRAMUSCULAR | Status: AC
  Administered 2023-04-21: 4 mg via INTRAVENOUS
  Filled 2023-04-21: qty 2

## 2023-04-21 MED ORDER — MORPHINE SULFATE (PF) 4 MG/ML IV SOLN
4.0000 mg | Freq: Once | INTRAVENOUS | Status: AC
  Administered 2023-04-21: 4 mg via INTRAVENOUS
  Filled 2023-04-21: qty 1

## 2023-04-21 MED ORDER — HYDROMORPHONE HCL 1 MG/ML IJ SOLN
1.0000 mg | Freq: Once | INTRAMUSCULAR | Status: AC
  Administered 2023-04-21: 1 mg via INTRAVENOUS
  Filled 2023-04-21: qty 1

## 2023-04-21 MED ORDER — AMOXICILLIN-POT CLAVULANATE 875-125 MG PO TABS: 1.0000 | ORAL_TABLET | Freq: Two times a day (BID) | ORAL | 0 refills | Status: AC

## 2023-04-21 MED ORDER — OXYCODONE-ACETAMINOPHEN 5-325 MG PO TABS: 1.0000 | ORAL_TABLET | Freq: Four times a day (QID) | ORAL | 0 refills | Status: AC | PRN

## 2023-04-21 MED ORDER — ONDANSETRON HCL 4 MG PO TABS: 4.0000 mg | ORAL_TABLET | Freq: Three times a day (TID) | ORAL | 0 refills | Status: AC | PRN

## 2023-04-21 NOTE — Discharge Instructions (Signed)
You were seen in the emergency department for continued left-sided flank pain.  Your kidney function was elevated but your CAT scan did not show any evidence of a kidney stone.  Your urine did not show signs of a kidney infection.  We are putting you on a second course of antibiotics and also prescription for pain medicine and nausea medication.  Please schedule a follow-up appointment with your primary care doctor in the next few days.  Return to the emergency department if any worsening or concerning symptoms.

## 2023-04-21 NOTE — ED Provider Notes (Signed)
[place] [facility] AT [facility] Provider Note   CSN: [id number] Arrival date & time: 04/21/23  1250     History  Chief Complaint  Patient presents with   Abdominal Pain   Emesis    Adriana Burke is a 53 y.o. female.  She is here with a complaint of continued left flank pain nausea vomiting poor intake general weakness that is been going on for a week.  She was here a week ago for same, given ceftriaxone and discharged with cephalexin for presumptive UTI.  She went to urgent care and they gave her a Lidoderm patch.  She said the pain continues to be severe.  She does not know if she has had a fever.  She has had nausea and vomiting.  No abdominal pain no urinary symptoms no vaginal bleeding or discharge.  No cough or shortness of breath.  Pain is keeping her up at night causing her to writhe around.   The history is provided by the patient.  Flank Pain This is a new problem. The current episode started more than 1 week ago. The problem occurs constantly. The problem has been gradually worsening. Pertinent negatives include no chest pain, no abdominal pain, no headaches and no shortness of breath. Nothing aggravates the symptoms. Nothing relieves the symptoms. She has tried rest and acetaminophen for the symptoms. The treatment provided no relief.       Home Medications Prior to Admission medications   Medication Sig Start Date End Date Taking? Authorizing Provider  albuterol (PROVENTIL HFA;VENTOLIN HFA) 108 (90 Base) MCG/ACT inhaler Inhale 1-2 puffs into the lungs every 6 (six) hours as needed for wheezing or shortness of breath. 11/06/16   Deatra Canter, FNP  atorvastatin (LIPITOR) 80 MG tablet Take 1 tablet (80 mg total) by mouth daily. 10/17/21   Atway, Derwood Kaplan, DO  cephALEXin (KEFLEX) 500 MG capsule 2 caps po bid x 7 days 04/12/23   Arthor Captain, PA-C  DULoxetine (CYMBALTA) 30 MG capsule Take 2 capsules (60 mg total) by mouth daily. 11/14/21 02/12/22   Atway, Rayann N, DO  FARXIGA 10 MG TABS tablet Take 1 tablet (10 mg total) by mouth daily. 02/01/22   Atway, Rayann N, DO  glucose blood test strip Use to check blood sugar 2 (two) times daily. 10/17/21   Atway, Rayann N, DO  HYDROcodone-acetaminophen (NORCO/VICODIN) 5-325 MG tablet Take 1 tablet by mouth every 6 (six) hours as needed for severe pain. 02/14/22   Cecil Cobbs, PA-C  Insulin Glargine (BASAGLAR KWIKPEN) 100 UNIT/ML INJECT 25 UNITS INTO THE SKIN DAILY. 02/20/22   Atway, Rayann N, DO  Insulin Pen Needle (PEN NEEDLES) 32G X 4 MM MISC Use daily as directed 02/01/22   Atway, Rayann N, DO  lidocaine (LIDODERM) 5 % Place 1 patch onto the skin daily. Remove & Discard patch within 12 hours or as directed by MD 04/17/23   Rodriguez-Southworth, Nettie Elm, PA-C  losartan-hydrochlorothiazide (HYZAAR) 50-12.5 MG tablet Take 1 tablet by mouth daily. 02/01/22   Chauncey Mann, DO  NON FORMULARY [facility]  Peripheral Neuropathy Cream- Bupivacaine 1%, Doxepin 3%, Gabapentin 6%, Pentoxifylline 3%, Topiramate 1% Apply 1-2 grams to affected area 3-4 times daily Qty. 120 gm 3 refills    [provider]  ondansetron (ZOFRAN) 4 MG tablet Take 1 tablet (4 mg total) by mouth every 8 (eight) hours as needed for nausea or vomiting. 04/12/23   Arthor Captain, PA-C  polyethylene glycol powder (GLYCOLAX/MIRALAX) 17  GM/SCOOP powder DISSOLVE 17 GRAMS IN 8 OZ OF FLUID LIQUID DRINK DAILY AS DIRECTED 10/16/20   Particia Nearing, PA-C  triamcinolone cream (KENALOG) 0.1 % Apply 1 application topically 2 (two) times daily. 10/15/20   Particia Nearing, PA-C  TRULICITY 0.75 MG/0.5ML SOPN Inject 0.75 mg into the skin once a week. 02/01/22   Atway, Rayann N, DO  pregabalin (LYRICA) 100 MG capsule Take 1 capsule (100 mg total) by mouth 3 (three) times daily. 09/26/17 04/06/20  Felecia Shelling, DPM      Allergies    Patient has no known allergies.    Review of Systems   Review of Systems   Constitutional:  Negative for fever.  Eyes:  Negative for visual disturbance.  Respiratory:  Negative for shortness of breath.   Cardiovascular:  Negative for chest pain.  Gastrointestinal:  Positive for nausea and vomiting. Negative for abdominal pain, constipation and diarrhea.  Genitourinary:  Positive for flank pain. Negative for dysuria, hematuria, vaginal bleeding and vaginal discharge.  Musculoskeletal:  Positive for back pain.  Neurological:  Negative for headaches.    Physical Exam Updated Vital Signs BP (!) 181/88 (BP Location: Right Arm)   Pulse 91   Temp 97.9 F (36.6 C)   Resp 20   SpO2 100%  Physical Exam Vitals and nursing note reviewed.  Constitutional:      General: She is not in acute distress.    Appearance: She is well-developed.  HENT:     Head: Normocephalic and atraumatic.  Eyes:     Conjunctiva/sclera: Conjunctivae normal.  Cardiovascular:     Rate and Rhythm: Normal rate and regular rhythm.     Heart sounds: No murmur heard. Pulmonary:     Effort: Pulmonary effort is normal. No respiratory distress.     Breath sounds: Normal breath sounds.  Abdominal:     Palpations: Abdomen is soft.     Tenderness: There is no abdominal tenderness. There is no guarding or rebound.  Musculoskeletal:        General: No swelling.     Cervical back: Neck supple.  Skin:    General: Skin is warm and dry.     Capillary Refill: Capillary refill takes less than 2 seconds.  Neurological:     General: No focal deficit present.     Mental Status: She is alert.     ED Results / Procedures / Treatments   Labs (all labs ordered are listed, but only abnormal results are displayed) Labs Reviewed  COMPREHENSIVE METABOLIC PANEL - Abnormal; Notable for the following components:      Result Value   Sodium 134 (*)    Glucose, Bld 352 (*)    Creatinine, Ser 1.91 (*)    Calcium 8.7 (*)    Total Protein 6.1 (*)    Albumin 2.1 (*)    GFR, Estimated 31 (*)    All other  components within normal limits  CBC - Abnormal; Notable for the following components:   Hemoglobin 11.0 (*)    HCT 35.3 (*)    MCV 71.7 (*)    MCH 22.4 (*)    RDW 15.6 (*)    All other components within normal limits  URINALYSIS, ROUTINE W REFLEX MICROSCOPIC - Abnormal; Notable for the following components:   Color, Urine STRAW (*)    Glucose, UA >=500 (*)    Protein, ur >=300 (*)    Bacteria, UA RARE (*)    All other components within normal limits  LIPASE, BLOOD  HCG, SERUM, QUALITATIVE  I-STAT CG4 LACTIC ACID, ED  I-STAT CG4 LACTIC ACID, ED    EKG None  Radiology CT Renal Stone Study Result Date: 04/21/2023 CLINICAL DATA:  Abdominal/flank pain, stone suspected left EXAM: CT ABDOMEN AND PELVIS WITHOUT CONTRAST TECHNIQUE: Multidetector CT imaging of the abdomen and pelvis was performed following the standard protocol without IV contrast. RADIATION DOSE REDUCTION: This exam was performed according to the departmental dose-optimization program which includes automated exposure control, adjustment of the mA and/or kV according to patient size and/or use of iterative reconstruction technique. COMPARISON:  December 29, 2010 FINDINGS: Evaluation is limited by lack of IV contrast. Lower chest: Bibasilar atelectasis. Hepatobiliary: Unremarkable noncontrast appearance of the liver and gallbladder. Pancreas: No peripancreatic fat stranding. Spleen: Unremarkable. Adrenals/Urinary Tract: Adrenal glands are unremarkable. No hydronephrosis. No obstructing nephrolithiasis. Bladder is nearly completely decompressed, limiting evaluation. Stomach/Bowel: No evidence of bowel obstruction. Appendix is normal. Stomach is decompressed. There is mild circumferential wall prominence of the sigmoid colon as well as a portion of the descending colon which could reflect peristalsis versus inflammation. Moderate upstream colonic stool burden. Vascular/Lymphatic: No significant vascular findings are present. No enlarged  abdominal or pelvic lymph nodes. Reproductive: Uterus and bilateral adnexa are unremarkable. Other: Small volume free fluid in the pelvis. Musculoskeletal: No acute or significant osseous findings. IMPRESSION: 1. No hydronephrosis. No obstructing nephrolithiasis. 2. There is mild circumferential wall prominence of the sigmoid colon as well as a portion of the descending colon which could reflect peristalsis versus nonspecific colitis. Recommend correlation with laboratory values and colon cancer screening history. 3. Small volume free fluid in the pelvis is nonspecific and could be physiologic. Electronically Signed   By: Meda Klinefelter M.D.   On: 04/21/2023 15:31    Procedures Procedures    Medications Ordered in ED Medications  morphine (PF) 4 MG/ML injection 4 mg (4 mg Intravenous Given 04/21/23 1443)  ondansetron (ZOFRAN) injection 4 mg (4 mg Intravenous Given 04/21/23 1443)  sodium chloride 0.9 % bolus 1,000 mL (0 mLs Intravenous Stopped 04/21/23 1854)  morphine (PF) 4 MG/ML injection 4 mg (4 mg Intravenous Given 04/21/23 1700)  HYDROmorphone (DILAUDID) injection 1 mg (1 mg Intravenous Given 04/21/23 1852)  ondansetron (ZOFRAN) injection 4 mg (4 mg Intravenous Given 04/21/23 1922)    ED Course/ Medical Decision Making/ A&P Clinical Course as of 04/21/23 2057  Sat Apr 21, 2023  2008 Had a long conversation with patient regarding admission versus home.  Ultimately she would like to go home on some pain medicine to follow-up with her primary care doctor.  She understands to return if any worsening or concerning symptoms. [MB]  2030 Patient tells me that her primary care doctor is aware of her elevated blood pressure and she is also referred her on to a kidney specialist due to her kidney disease. [MB]    Clinical Course User Index [MB] Terrilee Files, MD                                 Medical Decision Making Amount and/or Complexity of Data Reviewed Labs: ordered. Radiology:  ordered.  Risk Prescription drug management.   This patient complains of left flank pain and lower back pain; this involves an extensive number of treatment Options and is a complaint that carries with it a high risk of complications and morbidity. The differential includes kidney infection, kidney stone, musculoskeletal, vascular, intra-abdominal  I ordered, reviewed and interpreted labs, which included CBC with normal white count stable hemoglobin, chemistries with elevated glucose elevated creatinine, urinalysis without clear signs of infection, pregnancy negative, lactate normal I ordered medication IV fluids IV pain medication and nausea medication and reviewed PMP when indicated. I ordered imaging studies which included CT renal and I independently    visualized and interpreted imaging which showed no clear findings to explain patient's symptoms.  Does have some sigmoid colon thickening Additional history obtained from patient's significant other Previous records obtained and reviewed in epic including recent ED and urgent care visit Cardiac monitoring reviewed, sinus rhythm Social determinants considered, no significant barriers Critical Interventions: None  After the interventions stated above, I reevaluated the patient and found patient's pain to be improved but not completely resolved Admission and further testing considered, we discussed admission to the hospital for further management versus home with outpatient follow-up with her PCP.  At this time she is electing to go home.  Will provide prescriptions for pain medicine nausea medicine and another course of antibiotics.  Will need very close follow-up with PCP and further clarification of her kidney disease with nephrology.  Return instructions discussed         Final Clinical Impression(s) / ED Diagnoses Final diagnoses:  Acute left flank pain  AKI (acute kidney injury) (HCC)  Primary hypertension    Rx / DC  Orders ED Discharge Orders          Ordered    oxyCODONE-acetaminophen (PERCOCET/ROXICET) 5-325 MG tablet  Every 6 hours PRN        04/21/23 2007    ondansetron (ZOFRAN) 4 MG tablet  Every 8 hours PRN        04/21/23 2007    amoxicillin-clavulanate (AUGMENTIN) 875-125 MG tablet  Every 12 hours        04/21/23 2007              Terrilee Files, MD 04/21/23 2100

## 2023-04-21 NOTE — ED Provider Triage Note (Signed)
Emergency Medicine Provider Triage Evaluation Note  Adriana Burke , a 54 y.o. female  was evaluated in triage.  Pt complains of abdominal pain and vomiting.  Pt recently treated for a uti.   Review of Systems  Positive: vomiting Negative:   Physical Exam  BP (!) 181/88 (BP Location: Right Arm)   Pulse 91   Temp 97.9 F (36.6 C)   Resp 20   SpO2 100%  Gen:   Awake, no distress   Resp:  Normal effort  MSK:   Moves extremities without difficulty  Other:    Medical Decision Making  Medically screening exam initiated at 1:08 PM.  Appropriate orders placed.  Surah L Win was informed that the remainder of the evaluation will be completed by another provider, this initial triage assessment does not replace that evaluation, and the importance of remaining in the ED until their evaluation is complete.     Elson Areas, [place] 04/21/23 1309

## 2023-04-21 NOTE — ED Notes (Signed)
1st lac in normal range, 2nd not needed can be canceled

## 2023-04-21 NOTE — ED Triage Notes (Signed)
Pt c.o worsening left sided flank.abd pain and weakness, states she had 2 bloody emesis episodes this morning. Pt still taking keflex
